# Patient Record
Sex: Male | Born: 1950 | Race: White | Hispanic: No | Marital: Married | Smoking: Former smoker
Health system: Southern US, Community
[De-identification: ages and names within clinical notes are randomized; demographics above are authoritative.]

## PROBLEM LIST (undated history)

## (undated) DIAGNOSIS — K219 Gastro-esophageal reflux disease without esophagitis: Secondary | ICD-10-CM

## (undated) DIAGNOSIS — I2510 Atherosclerotic heart disease of native coronary artery without angina pectoris: Secondary | ICD-10-CM

## (undated) DIAGNOSIS — G43909 Migraine, unspecified, not intractable, without status migrainosus: Secondary | ICD-10-CM

## (undated) DIAGNOSIS — R51 Headache: Secondary | ICD-10-CM

## (undated) DIAGNOSIS — R519 Headache, unspecified: Secondary | ICD-10-CM

## (undated) DIAGNOSIS — R972 Elevated prostate specific antigen [PSA]: Secondary | ICD-10-CM

## (undated) DIAGNOSIS — E785 Hyperlipidemia, unspecified: Secondary | ICD-10-CM

## (undated) DIAGNOSIS — I10 Essential (primary) hypertension: Secondary | ICD-10-CM

## (undated) DIAGNOSIS — Z842 Family history of other diseases of the genitourinary system: Secondary | ICD-10-CM

## (undated) HISTORY — PX: HERNIA REPAIR: SHX51

## (undated) HISTORY — DX: Gastro-esophageal reflux disease without esophagitis: K21.9

## (undated) HISTORY — DX: Essential (primary) hypertension: I10

## (undated) HISTORY — DX: Headache, unspecified: R51.9

## (undated) HISTORY — DX: Migraine, unspecified, not intractable, without status migrainosus: G43.909

## (undated) HISTORY — PX: CHOLECYSTECTOMY: SHX55

## (undated) HISTORY — DX: Hyperlipidemia, unspecified: E78.5

## (undated) HISTORY — DX: Family history of other diseases of the genitourinary system: Z84.2

## (undated) HISTORY — DX: Elevated prostate specific antigen (PSA): R97.20

## (undated) HISTORY — PX: VASECTOMY: SHX75

## (undated) HISTORY — DX: Headache: R51

## (undated) HISTORY — PX: APPENDECTOMY: SHX54

---

## 2017-09-10 ENCOUNTER — Ambulatory Visit (INDEPENDENT_AMBULATORY_CARE_PROVIDER_SITE_OTHER): Payer: Medicare Other | Admitting: Family Medicine

## 2017-09-10 ENCOUNTER — Encounter: Payer: Self-pay | Admitting: Family Medicine

## 2017-09-10 VITALS — BP 100/78 | HR 72 | Temp 98.0°F | Ht 72.0 in | Wt 199.0 lb

## 2017-09-10 DIAGNOSIS — Z7689 Persons encountering health services in other specified circumstances: Secondary | ICD-10-CM | POA: Diagnosis not present

## 2017-09-10 DIAGNOSIS — I10 Essential (primary) hypertension: Secondary | ICD-10-CM | POA: Diagnosis not present

## 2017-09-10 DIAGNOSIS — K219 Gastro-esophageal reflux disease without esophagitis: Secondary | ICD-10-CM | POA: Diagnosis not present

## 2017-09-10 DIAGNOSIS — N401 Enlarged prostate with lower urinary tract symptoms: Secondary | ICD-10-CM

## 2017-09-10 DIAGNOSIS — Z8669 Personal history of other diseases of the nervous system and sense organs: Secondary | ICD-10-CM

## 2017-09-10 DIAGNOSIS — R3914 Feeling of incomplete bladder emptying: Secondary | ICD-10-CM

## 2017-09-10 NOTE — Patient Instructions (Signed)
Benign Prostatic Hyperplasia Benign prostatic hyperplasia (BPH) is an enlarged prostate gland that is caused by the normal aging process and not by cancer. The prostate is a walnut-sized gland that is involved in the production of semen. It is located in front of the rectum and below the bladder. The bladder stores urine and the urethra is the tube that carries the urine out of the body. The prostate may get bigger as a man gets older. An enlarged prostate can press on the urethra. This can make it harder to pass urine. The build-up of urine in the bladder can cause infection. Back pressure and infection may progress to bladder damage and kidney (renal) failure. What are the causes? This condition is part of a normal aging process. However, not all men develop problems from this condition. If the prostate enlarges away from the urethra, urine flow will not be blocked. If it enlarges toward the urethra and compresses it, there will be problems passing urine. What increases the risk? This condition is more likely to develop in men over the age of 47 years. What are the signs or symptoms? Symptoms of this condition include:  Getting up often during the night to urinate.  Needing to urinate frequently during the day.  Difficulty starting urine flow.  Decrease in size and strength of your urine stream.  Leaking (dribbling) after urinating.  Inability to pass urine. This needs immediate treatment.  Inability to completely empty your bladder.  Pain when you pass urine. This is more common if there is also an infection.  Urinary tract infection (UTI).  How is this diagnosed? This condition is diagnosed based on your medical history, a physical exam, and your symptoms. Tests will also be done, such as:  A post-void bladder scan. This measures any amount of urine that may remain in your bladder after you finish urinating.  A digital rectal exam. In a rectal exam, your health care provider  checks your prostate by putting a lubricated, gloved finger into your rectum to feel the back of your prostate gland. This exam detects the size of your gland and any abnormal lumps or growths.  An exam of your urine (urinalysis).  A prostate specific antigen (PSA) screening. This is a blood test used to screen for prostate cancer.  An ultrasound. This test uses sound waves to electronically produce a picture of your prostate gland.  Your health care provider may refer you to a specialist in kidney and prostate diseases (urologist). How is this treated? Once symptoms begin, your health care provider will monitor your condition (active surveillance or watchful waiting). Treatment for this condition will depend on the severity of your condition. Treatment may include:  Observation and yearly exams. This may be the only treatment needed if your condition and symptoms are mild.  Medicines to relieve your symptoms, including: ? Medicines to shrink the prostate. ? Medicines to relax the muscle of the prostate.  Surgery in severe cases. Surgery may include: ? Prostatectomy. In this procedure, the prostate tissue is removed completely through an open incision or with a laparascope or robotics. ? Transurethral resection of the prostate (TURP). In this procedure, a tool is inserted through the opening at the tip of the penis (urethra). It is used to cut away tissue of the inner core of the prostate. The pieces are removed through the same opening of the penis. This removes the blockage. ? Transurethral incision (TUIP). In this procedure, small cuts are made in the prostate. This  lessens the prostate's pressure on the urethra. ? Transurethral microwave thermotherapy (TUMT). This procedure uses microwaves to create heat. The heat destroys and removes a small amount of prostate tissue. ? Transurethral needle ablation (TUNA). This procedure uses radio frequencies to destroy and remove a small amount of  prostate tissue. ? Interstitial laser coagulation ([place]). This procedure uses a laser to destroy and remove a small amount of prostate tissue. ? Transurethral electrovaporization (TUVP). This procedure uses electrodes to destroy and remove a small amount of prostate tissue. ? Prostatic urethral lift. This procedure inserts an implant to push the lobes of the prostate away from the urethra.  Follow these instructions at home:  Take over-the-counter and prescription medicines only as told by your health care provider.  Monitor your symptoms for any changes. Contact your health care provider with any changes.  Avoid drinking large amounts of liquid before going to bed or out in public.  Avoid or reduce how much caffeine or alcohol you drink.  Give yourself time when you urinate.  Keep all follow-up visits as told by your health care provider. This is important. Contact a health care provider if:  You have unexplained back pain.  Your symptoms do not get better with treatment.  You develop side effects from the medicine you are taking.  Your urine becomes very dark or has a bad smell.  Your lower abdomen becomes distended and you have trouble passing your urine. Get help right away if:  You have a fever or chills.  You suddenly cannot urinate.  You feel lightheaded, or very dizzy, or you faint.  There are large amounts of blood or clots in the urine.  Your urinary problems become hard to manage.  You develop moderate to severe low back or flank pain. The flank is the side of your body between the ribs and the hip. These symptoms may represent a serious problem that is an emergency. Do not wait to see if the symptoms will go away. Get medical help right away. Call your local emergency services (911 in the [place] Do not drive yourself to the hospital. Summary  Benign prostatic hyperplasia (BPH) is an enlarged prostate that is caused by the normal aging process and not by  cancer.  An enlarged prostate can press on the urethra. This can make it hard to pass urine.  This condition is part of a normal aging process and is more likely to develop in men over the age of 25 years.  Get help right away if you suddenly cannot urinate. This information is not intended to replace advice given to you by your health care provider. Make sure you discuss any questions you have with your health care provider. Document Released: 02/20/2005 Document Revised: 03/27/2016 Document Reviewed: 03/27/2016 Elsevier Interactive Patient Education  2018 [employer].  Migraine Headache A migraine headache is an intense, throbbing pain on one side or both sides of the head. Migraines may also cause other symptoms, such as nausea, vomiting, and sensitivity to light and noise. What are the causes? Doing or taking certain things may also trigger migraines, such as:  Alcohol.  Smoking.  Medicines, such as: ? Medicine used to treat chest pain (nitroglycerine). ? Birth control pills. ? Estrogen pills. ? Certain blood pressure medicines.  Aged cheeses, chocolate, or caffeine.  Foods or drinks that contain nitrates, glutamate, aspartame, or tyramine.  Physical activity.  Other things that may trigger a migraine include:  Menstruation.  Pregnancy.  Hunger.  Stress, lack  of sleep, too much sleep, or fatigue.  Weather changes.  What increases the risk? The following factors may make you more likely to experience migraine headaches:  Age. Risk increases with age.  Family history of migraine headaches.  Being Caucasian.  Depression and anxiety.  Obesity.  Being a woman.  Having a hole in the heart (patent foramen ovale) or other heart problems.  What are the signs or symptoms? The main symptom of this condition is pulsating or throbbing pain. Pain may:  Happen in any area of the head, such as on one side or both sides.  Interfere with daily activities.  Get  worse with physical activity.  Get worse with exposure to bright lights or loud noises.  Other symptoms may include:  Nausea.  Vomiting.  Dizziness.  General sensitivity to bright lights, loud noises, or smells.  Before you get a migraine, you may get warning signs that a migraine is developing (aura). An aura may include:  Seeing flashing lights or having blind spots.  Seeing bright spots, halos, or zigzag lines.  Having tunnel vision or blurred vision.  Having numbness or a tingling feeling.  Having trouble talking.  Having muscle weakness.  How is this diagnosed? A migraine headache can be diagnosed based on:  Your symptoms.  A physical exam.  Tests, such as CT scan or MRI of the head. These imaging tests can help rule out other causes of headaches.  Taking fluid from the spine (lumbar puncture) and analyzing it (cerebrospinal fluid analysis, or CSF analysis).  How is this treated? A migraine headache is usually treated with medicines that:  Relieve pain.  Relieve nausea.  Prevent migraines from coming back.  Treatment may also include:  Acupuncture.  Lifestyle changes like avoiding foods that trigger migraines.  Follow these instructions at home: Medicines  Take over-the-counter and prescription medicines only as told by your health care provider.  Do not drive or use heavy machinery while taking prescription pain medicine.  To prevent or treat constipation while you are taking prescription pain medicine, your health care provider may recommend that you: ? Drink enough fluid to keep your urine clear or pale yellow. ? Take over-the-counter or prescription medicines. ? Eat foods that are high in fiber, such as fresh fruits and vegetables, whole grains, and beans. ? Limit foods that are high in fat and processed sugars, such as fried and sweet foods. Lifestyle  Avoid alcohol use.  Do not use any products that contain nicotine or tobacco, such as  cigarettes and e-cigarettes. If you need help quitting, ask your health care provider.  Get at least 8 hours of sleep every night.  Limit your stress. General instructions   Keep a journal to find out what may trigger your migraine headaches. For example, write down: ? What you eat and drink. ? How much sleep you get. ? Any change to your diet or medicines.  If you have a migraine: ? Avoid things that make your symptoms worse, such as bright lights. ? It may help to lie down in a dark, quiet room. ? Do not drive or use heavy machinery. ? Ask your health care provider what activities are safe for you while you are experiencing symptoms.  Keep all follow-up visits as told by your health care provider. This is important. Contact a health care provider if:  You develop symptoms that are different or more severe than your usual migraine symptoms. Get help right away if:  Your migraine becomes  severe.  You have a fever.  You have a stiff neck.  You have vision loss.  Your muscles feel weak or like you cannot control them.  You start to lose your balance often.  You develop trouble walking.  You faint. This information is not intended to replace advice given to you by your health care provider. Make sure you discuss any questions you have with your health care provider. Document Released: 02/20/2005 Document Revised: 09/10/2015 Document Reviewed: 08/09/2015 Elsevier Interactive Patient Education  2017 [employer].
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
## 2017-09-14 ENCOUNTER — Ambulatory Visit (INDEPENDENT_AMBULATORY_CARE_PROVIDER_SITE_OTHER): Payer: Medicare Other | Admitting: Family Medicine

## 2017-09-14 ENCOUNTER — Encounter: Payer: Self-pay | Admitting: Family Medicine

## 2017-09-14 VITALS — BP 110/80 | HR 66 | Temp 98.1°F | Ht 72.0 in | Wt 199.9 lb

## 2017-09-14 DIAGNOSIS — Z13220 Encounter for screening for lipoid disorders: Secondary | ICD-10-CM | POA: Diagnosis not present

## 2017-09-14 DIAGNOSIS — Z23 Encounter for immunization: Secondary | ICD-10-CM | POA: Diagnosis not present

## 2017-09-14 DIAGNOSIS — I10 Essential (primary) hypertension: Secondary | ICD-10-CM

## 2017-09-14 DIAGNOSIS — R972 Elevated prostate specific antigen [PSA]: Secondary | ICD-10-CM | POA: Diagnosis not present

## 2017-09-14 DIAGNOSIS — Z0000 Encounter for general adult medical examination without abnormal findings: Secondary | ICD-10-CM

## 2017-09-14 DIAGNOSIS — E782 Mixed hyperlipidemia: Secondary | ICD-10-CM

## 2017-09-14 LAB — LIPID PANEL
CHOL/HDL RATIO: 3
Cholesterol: 143 mg/dL (ref 0–200)
HDL: 46.4 mg/dL (ref >39.00)
LDL CALC: 77 mg/dL (ref 0–99)
NONHDL: 96.25
TRIGLYCERIDES: 94 mg/dL (ref 0.0–149.0)
VLDL: 18.8 mg/dL (ref 0.0–40.0)

## 2017-09-14 LAB — BASIC METABOLIC PANEL
BUN: 23 mg/dL (ref 6–23)
CHLORIDE: 106 meq/L (ref 96–112)
CO2: 28 meq/L (ref 19–32)
Calcium: 9.3 mg/dL (ref 8.4–10.5)
Creatinine, Ser: 1.22 mg/dL (ref 0.40–1.50)
GFR: 63.36 mL/min (ref >60.00)
GLUCOSE: 107 mg/dL — AB (ref 70–99)
POTASSIUM: 4.7 meq/L (ref 3.5–5.1)
SODIUM: 142 meq/L (ref 135–145)

## 2017-09-14 LAB — CBC WITH DIFFERENTIAL/PLATELET
BASOS ABS: 0 10*3/uL (ref 0.0–0.1)
Basophils Relative: 0.6 % (ref 0.0–3.0)
EOS PCT: 3.2 % (ref 0.0–5.0)
Eosinophils Absolute: 0.2 10*3/uL (ref 0.0–0.7)
HCT: 45 % (ref 39.0–52.0)
Hemoglobin: 15.3 g/dL (ref 13.0–17.0)
LYMPHS ABS: 2.2 10*3/uL (ref 0.7–4.0)
Lymphocytes Relative: 28.9 % (ref 12.0–46.0)
MCHC: 33.9 g/dL (ref 30.0–36.0)
MCV: 93.4 fl (ref 78.0–100.0)
MONO ABS: 0.5 10*3/uL (ref 0.1–1.0)
Monocytes Relative: 6.7 % (ref 3.0–12.0)
NEUTROS PCT: 60.6 % (ref 43.0–77.0)
Neutro Abs: 4.7 10*3/uL (ref 1.4–7.7)
Platelets: 177 10*3/uL (ref 150.0–400.0)
RBC: 4.82 Mil/uL (ref 4.22–5.81)
RDW: 13.5 % (ref 11.5–15.5)
WBC: 7.7 10*3/uL (ref 4.0–10.5)

## 2017-09-14 LAB — PSA: PSA: 2.32 ng/mL (ref 0.10–4.00)

## 2017-09-14 NOTE — Patient Instructions (Signed)
Preventive Care 65 Years and Older, Male Preventive care refers to lifestyle choices and visits with your health care provider that can promote health and wellness. What does preventive care include?  A yearly physical exam. This is also called an annual well check.  Dental exams once or twice a year.  Routine eye exams. Ask your health care provider how often you should have your eyes checked.  Personal lifestyle choices, including: ? Daily care of your teeth and gums. ? Regular physical activity. ? Eating a healthy diet. ? Avoiding tobacco and drug use. ? Limiting alcohol use. ? Practicing safe sex. ? Taking low doses of aspirin every day. ? Taking vitamin and mineral supplements as recommended by your health care provider. What happens during an annual well check? The services and screenings done by your health care provider during your annual well check will depend on your age, overall health, lifestyle risk factors, and family history of disease. Counseling Your health care provider may ask you questions about your:  Alcohol use.  Tobacco use.  Drug use.  Emotional well-being.  Home and relationship well-being.  Sexual activity.  Eating habits.  History of falls.  Memory and ability to understand (cognition).  Work and work environment.  Screening You may have the following tests or measurements:  Height, weight, and BMI.  Blood pressure.  Lipid and cholesterol levels. These may be checked every 5 years, or more frequently if you are over 50 years old.  Skin check.  Lung cancer screening. You may have this screening every year starting at age 55 if you have a 30-pack-year history of smoking and currently smoke or have quit within the past 15 years.  Fecal occult blood test (FOBT) of the stool. You may have this test every year starting at age 50.  Flexible sigmoidoscopy or colonoscopy. You may have a sigmoidoscopy every 5 years or a colonoscopy every 10  years starting at age 50.  Prostate cancer screening. Recommendations will vary depending on your family history and other risks.  Hepatitis C blood test.  Hepatitis B blood test.  Sexually transmitted disease (STD) testing.  Diabetes screening. This is done by checking your blood sugar (glucose) after you have not eaten for a while (fasting). You may have this done every 1-3 years.  Abdominal aortic aneurysm (AAA) screening. You may need this if you are a current or former smoker.  Osteoporosis. You may be screened starting at age 70 if you are at high risk.  Talk with your health care provider about your test results, treatment options, and if necessary, the need for more tests. Vaccines Your health care provider may recommend certain vaccines, such as:  Influenza vaccine. This is recommended every year.  Tetanus, diphtheria, and acellular pertussis (Tdap, Td) vaccine. You may need a Td booster every 10 years.  Varicella vaccine. You may need this if you have not been vaccinated.  Zoster vaccine. You may need this after age 60.  Measles, mumps, and rubella (MMR) vaccine. You may need at least one dose of MMR if you were born in 1957 or later. You may also need a second dose.  Pneumococcal 13-valent conjugate (PCV13) vaccine. One dose is recommended after age 65.  Pneumococcal polysaccharide (PPSV23) vaccine. One dose is recommended after age 65.  Meningococcal vaccine. You may need this if you have certain conditions.  Hepatitis A vaccine. You may need this if you have certain conditions or if you travel or work in places where you   may be exposed to hepatitis A.  Hepatitis B vaccine. You may need this if you have certain conditions or if you travel or work in places where you may be exposed to hepatitis B.  Haemophilus influenzae type b (Hib) vaccine. You may need this if you have certain risk factors.  Talk to your health care provider about which screenings and vaccines  you need and how often you need them. This information is not intended to replace advice given to you by your health care provider. Make sure you discuss any questions you have with your health care provider. Document Released: 03/19/2015 Document Revised: 11/10/2015 Document Reviewed: 12/22/2014 Elsevier Interactive Patient Education  2018 [employer]  

## 2017-09-14 NOTE — Progress Notes (Signed)
Subjective:  Patient presents today for their Welcome to Medicare Exam    Preventive Screening-Counseling & Management  Vision screen: wears glasses No exam data present  Advanced directives: no  Smoking Status: Never Smoker Second Hand Smoking status: no smokers in home  Risk Factors Regular exercise: work around the house Diet: Paradox diet--lost 40 lbs.   Tries to limit greasy foods as it causes GERD.  Limits caffeine, drinking plenty of water Fall Risk: None  No flowsheet data found. Opioid use history: no long term opioids use  Cardiac risk factors:  advanced age (older than 42 for men, 28 for women)  Hyperlipidemia: yes No diabetes. no Family History: yes.  Mom-HTN Dad-DM, Heart Dz, HDL, HTN, MI  Depression Screen None. PHQ2 0  No flowsheet data found.  Activities of Daily Living Independent ADLs and IADLs   Hearing Difficulties: no Cognitive Testing             No reported trouble.    Normal 3 word recall  List the Names of Other Physician/Practitioners you currently use: -none.  Referral for Urology pending.  H/o elevated PSA in the past.   -previously seen by [facility] in [place], [place]  Immunization History  Administered Date(s) Administered  . Tdap 09/14/2017   Required Immunizations needed today  Tdap  Screening tests- up to date Health Maintenance Due  Topic Date Due  . Hepatitis C Screening  [birth year]  . HIV Screening  09/15/1967  . PNA vac Low Risk Adult (1 of 2 - PCV13) 09/14/2017    ROS- No pertinent positives discovered in course of AWV  The following were reviewed and entered/updated in epic: Past Medical History:  Diagnosis Date  . Elevated PSA   . Family history of prostate problems   . Frequent headaches   . GERD (gastroesophageal reflux disease)   . Hyperlipidemia   . Hypertension   . Migraines    There are no active problems to display for this patient.  Past Surgical History:  Procedure Laterality Date    . APPENDECTOMY    . CHOLECYSTECTOMY    . HERNIA REPAIR    . VASECTOMY      Family History  Problem Relation Age of Onset  . Hypertension Mother   . Cancer Father   . Diabetes Father   . Heart attack Father   . Heart disease Father   . Hyperlipidemia Father   . Hypertension Father   . Hypertension Brother   . Hyperlipidemia Brother   . Heart disease Brother   . Hearing loss Brother   . Early death Maternal Grandfather   . Diabetes Paternal Grandmother   . Early death Paternal Grandfather   . Heart disease Paternal Grandfather     Medications- reviewed and updated Current Outpatient Medications  Medication Sig Dispense Refill  . amitriptyline (ELAVIL) 50 MG tablet Take 50 mg by mouth as needed for sleep.    Marland Kitchen amLODipine (NORVASC) 5 MG tablet Take 5 mg by mouth daily.    Marland Kitchen aspirin EC 81 MG tablet Take 81 mg by mouth daily.    Marland Kitchen omeprazole (PRILOSEC) 20 MG capsule Take 20 mg by mouth daily.    . Potassium 99 MG TABS Take 99 mg by mouth daily.     No current facility-administered medications for this visit.     Allergies-reviewed and updated Allergies  Allergen Reactions  . Amoxicillin Other (See Comments)    "Cloudy headed" with long doses  Social History   Socioeconomic History  . Marital status: Married    Spouse name: Not on file  . Number of children: Not on file  . Years of education: Not on file  . Highest education level: Not on file  Occupational History  . Not on file  Social Needs  . Financial resource strain: Not on file  . Food insecurity:    Worry: Not on file    Inability: Not on file  . Transportation needs:    Medical: Not on file    Non-medical: Not on file  Tobacco Use  . Smoking status: Former Research scientist (life sciences)  . Smokeless tobacco: Former Network engineer and Sexual Activity  . Alcohol use: Yes  . Drug use: Never  . Sexual activity: Not Currently  Lifestyle  . Physical activity:    Days per week: Not on file    Minutes per session: Not  on file  . Stress: Not on file  Relationships  . Social connections:    Talks on phone: Not on file    Gets together: Not on file    Attends religious service: Not on file    Active member of club or organization: Not on file    Attends meetings of clubs or organizations: Not on file    Relationship status: Not on file  Other Topics Concern  . Not on file  Social History Narrative  . Not on file    Objective: BP 110/80 (BP Location: Left Arm, Patient Position: Sitting, Cuff Size: Normal)   Pulse 66   Temp 98.1 F (36.7 C) (Oral)   Ht 6' (1.829 m)   Wt 199 lb 14.4 oz (90.7 kg)   SpO2 97%   BMI 27.11 kg/m  Gen: NAD, well developed, well nourished. HEENT: [place]/AT, PERRL, TMs normal bilaterally.  No cervical lymphadenopathy.  Mucous membranes are moist. Oropharynx normal. Neck: no thyromegaly, no carotid bruits CV: RRR, no murmurs rubs or gallops Lungs: CTAB, no crackles, wheeze, rhonchi Abdomen: soft/nontender/nondistended/normal bowel sounds. No rebound or guarding.  Ext: no edema, moves all four Skin: warm, dry Neuro: A&Ox3, grossly normal, normal gait  Assessment/Plan:  Welcome to Medicare exam completed- discussed recommended screenings and documented any personalized health advice and referrals for preventive counseling. See AVS as well which was given to patient.   Status of chronic or acute concerns  HTN- controlled on no meds.  If becomes elevated re-prescribe Norvasc 5 mg.  GERD- continue weight loss, as symptoms have improved.  Omeprazole prn  BPH- consider restarting flomax 0.4 mg.  Is waiting to see urology.  No problem-specific Assessment & Plan notes found for this encounter.   No future appointments. No follow-ups on file.   Lab/Order associations: Need for diphtheria-tetanus-pertussis (Tdap) vaccine - Plan: Tdap vaccine greater than or equal to 7yo IM  Screening for cholesterol level - Plan: Lipid panel  Essential hypertension - Plan: CBC with  Differential/Platelet, Basic metabolic panel  Encounter for preventive care  Elevated PSA - Plan: PSA  No orders of the defined types were placed in this encounter.   Return precautions advised. Billie Ruddy, MD

## 2017-09-19 ENCOUNTER — Ambulatory Visit: Payer: Medicare Other

## 2017-10-10 DIAGNOSIS — R3912 Poor urinary stream: Secondary | ICD-10-CM | POA: Diagnosis not present

## 2017-10-10 DIAGNOSIS — N401 Enlarged prostate with lower urinary tract symptoms: Secondary | ICD-10-CM | POA: Diagnosis not present

## 2017-10-10 DIAGNOSIS — R350 Frequency of micturition: Secondary | ICD-10-CM | POA: Diagnosis not present

## 2017-11-01 ENCOUNTER — Ambulatory Visit (INDEPENDENT_AMBULATORY_CARE_PROVIDER_SITE_OTHER): Payer: Medicare Other | Admitting: Family Medicine

## 2017-11-01 ENCOUNTER — Encounter: Payer: Self-pay | Admitting: Family Medicine

## 2017-11-01 VITALS — BP 102/77 | HR 77 | Temp 98.3°F | Wt 201.3 lb

## 2017-11-01 DIAGNOSIS — M10079 Idiopathic gout, unspecified ankle and foot: Secondary | ICD-10-CM

## 2017-11-01 MED ORDER — PREDNISONE 20 MG PO TABS: ORAL_TABLET | ORAL | 0 refills | Status: DC

## 2017-11-01 NOTE — Progress Notes (Signed)
  Subjective:     Patient ID: Ronald Olsen, male   DOB: June 30, 1950, 67 y.o.   MRN: [id number]  HPI Patient seen with concern for possible gout involving both feet. He's never had gout flareup previously but there is positive family history in his father. He noted a few days ago onset of pain,swelling, redness and warmth metatarsophalangeal joint of both feet as well as left heel. He tried over-the-counter aspirin without much improvement.  Denies any recent fevers or chills. No injury. Only regular medication is Flomax.  Does not drink alcohol regularly. Last beer was about 2 weeks ago. No recent dehydration  Past Medical History:  Diagnosis Date  . Elevated PSA   . Family history of prostate problems   . Frequent headaches   . GERD (gastroesophageal reflux disease)   . Hyperlipidemia   . Hypertension   . Migraines    Past Surgical History:  Procedure Laterality Date  . APPENDECTOMY    . CHOLECYSTECTOMY    . HERNIA REPAIR    . VASECTOMY      reports that he has quit smoking. He has quit using smokeless tobacco. He reports that he drinks alcohol. He reports that he does not use drugs. family history includes Cancer in his father; Diabetes in his father and paternal grandmother; Early death in his maternal grandfather and paternal grandfather; Hearing loss in his brother; Heart attack in his father; Heart disease in his brother, father, and paternal grandfather; Hyperlipidemia in his brother and father; Hypertension in his brother, father, and mother. Allergies  Allergen Reactions  . Amoxicillin Other (See Comments)    "Cloudy headed" with long doses     Review of Systems  Constitutional: Negative for chills and fever.  Musculoskeletal: Positive for arthralgias.       Objective:   Physical Exam  Constitutional: He appears well-developed and well-nourished.  Cardiovascular: Normal rate and regular rhythm.  Pulmonary/Chest: Effort normal and breath sounds normal.   Musculoskeletal:  Patient has some mild swelling, warmth, erythema involving both MTP joints. He has some left retrocalcaneal erythema as well. No Achilles tendon tenderness. No problems with dorsiflexion or plantarflexion left foot       Assessment:     Problem acute gout involving both feet metatarsophalangeal joints    Plan:     -discussed dietary factors as related to gout. Stressed importance of good hydration. Recommend low purine diet. -Prednisone 20 mg 2 tablets once daily for 6 days -Follow-up with primary if symptoms not resolving over the next few days or for any recurrence. We did briefly discuss possible prophylactic issues but he is not interested at this point  [name] MD [facility] at [facility]

## 2017-11-01 NOTE — Patient Instructions (Signed)
Low-Purine Diet  Purines are compounds that affect the level of uric acid in your body. A low-purine diet is a diet that is low in purines. Eating a low-purine diet can prevent the level of uric acid in your body from getting too high and causing gout or kidney stones or both.  What do I need to know about this diet?   Choose low-purine foods. Examples of low-purine foods are listed in the next section.   Drink plenty of fluids, especially water. Fluids can help remove uric acid from your body. Try to drink 8-16 cups (1.9-3.8 L) a day.   Limit foods high in fat, especially saturated fat, as fat makes it harder for the body to get rid of uric acid. Foods high in saturated fat include pizza, cheese, ice cream, whole milk, fried foods, and gravies. Choose foods that are lower in fat and lean sources of protein. Use olive oil when cooking as it contains healthy fats that are not high in saturated fat.   Limit alcohol. Alcohol interferes with the elimination of uric acid from your body. If you are having a gout attack, avoid all alcohol.   Keep in mind that different people's bodies react differently to different foods. You will probably learn over time which foods do or do not affect you. If you discover that a food tends to cause your gout to flare up, avoid eating that food. You can more freely enjoy foods that do not cause problems. If you have any questions about a food item, talk to your dietitian or health care provider.  Which foods are low, moderate, and high in purines?  The following is a list of foods that are low, moderate, and high in purines. You can eat any amount of the foods that are low in purines. You may be able to have small amounts of foods that are moderate in purines. Ask your health care provider how much of a food moderate in purines you can have. Avoid foods high in purines.  Grains   Foods low in purines: Enriched white bread, pasta, rice, cake, cornbread, popcorn.   Foods moderate in  purines: Whole-grain breads and cereals, wheat germ, bran, oatmeal. Uncooked oatmeal. Dry wheat bran or wheat germ.   Foods high in purines: Pancakes, French toast, biscuits, muffins.  Vegetables   Foods low in purines: All vegetables, except those that are moderate in purines.   Foods moderate in purines: Asparagus, cauliflower, spinach, mushrooms, green peas.  Fruits   All fruits are low in purines.  Meats and other Protein Foods   Foods low in purines: Eggs, nuts, peanut butter.   Foods moderate in purines: 80-90% lean beef, lamb, veal, pork, poultry, fish, eggs, peanut butter, nuts. Crab, lobster, oysters, and shrimp. Cooked dried beans, peas, and lentils.   Foods high in purines: Anchovies, sardines, herring, mussels, tuna, codfish, scallops, trout, and haddock. Bacon. Organ meats (such as liver or kidney). Tripe. Game meat. Goose. Sweetbreads.  Dairy   All dairy foods are low in purines. Low-fat and fat-free dairy products are best because they are low in saturated fat.  Beverages   Drinks low in purines: Water, carbonated beverages, tea, coffee, cocoa.   Drinks moderate in purines: Soft drinks and other drinks sweetened with high-fructose corn syrup. Juices. To find whether a food or drink is sweetened with high-fructose corn syrup, look at the ingredients list.   Drinks high in purines: Alcoholic beverages (such as beer).  Condiments   Foods   low in purines: Salt, herbs, olives, pickles, relishes, vinegar.   Foods moderate in purines: Butter, margarine, oils, mayonnaise.  Fats and Oils   Foods low in purines: All types, except gravies and sauces made with meat.   Foods high in purines: Gravies and sauces made with meat.  Other Foods   Foods low in purines: Sugars, sweets, gelatin. Cake. Soups made without meat.   Foods moderate in purines: Meat-based or fish-based soups, broths, or bouillons. Foods and drinks sweetened with high-fructose corn syrup.   Foods high in purines: High-fat desserts  (such as ice cream, cookies, cakes, pies, doughnuts, and chocolate).  Contact your dietitian for more information on foods that are not listed here.  This information is not intended to replace advice given to you by your health care provider. Make sure you discuss any questions you have with your health care provider.  Document Released: 06/17/2010 Document Revised: 07/29/2015 Document Reviewed: 01/27/2013  Elsevier Interactive Patient Education  2017 Elsevier Inc.      Gout  Gout is painful swelling that can occur in some of your joints. Gout is a type of arthritis. This condition is caused by having too much uric acid in your body. Uric acid is a chemical that forms when your body breaks down substances called purines. Purines are important for building body proteins.  When your body has too much uric acid, sharp crystals can form and build up inside your joints. This causes pain and swelling. Gout attacks can happen quickly and be very painful (acute gout). Over time, the attacks can affect more joints and become more frequent (chronic gout). Gout can also cause uric acid to build up under your skin and inside your kidneys.  What are the causes?  This condition is caused by too much uric acid in your blood. This can occur because:   Your kidneys do not remove enough uric acid from your blood. This is the most common cause.   Your body makes too much uric acid. This can occur with some cancers and cancer treatments. It can also occur if your body is breaking down too many red blood cells (hemolytic anemia).   You eat too many foods that are high in purines. These foods include organ meats and some seafood. Alcohol, especially beer, is also high in purines.    A gout attack may be triggered by trauma or stress.  What increases the risk?  This condition is more likely to develop in people who:   Have a family history of gout.   Are male and middle-aged.   Are male and have gone through menopause.   Are  obese.   Frequently drink alcohol, especially beer.   Are dehydrated.   Lose weight too quickly.   Have an organ transplant.   Have lead poisoning.   Take certain medicines, including aspirin, cyclosporine, diuretics, levodopa, and niacin.   Have kidney disease or psoriasis.    What are the signs or symptoms?  An attack of acute gout happens quickly. It usually occurs in just one joint. The most common place is the big toe. Attacks often start at night. Other joints that may be affected include joints of the feet, ankle, knee, fingers, wrist, or elbow. Symptoms may include:   Severe pain.   Warmth.   Swelling.   Stiffness.   Tenderness. The affected joint may be very painful to touch.   Shiny, red, or purple skin.   Chills and fever.      Chronic gout may cause symptoms more frequently. More joints may be involved. You may also have white or yellow lumps (tophi) on your hands or feet or in other areas near your joints.  How is this diagnosed?  This condition is diagnosed based on your symptoms, medical history, and physical exam. You may have tests, such as:   Blood tests to measure uric acid levels.   Removal of joint fluid with a needle (aspiration) to look for uric acid crystals.   X-rays to look for joint damage.    How is this treated?  Treatment for this condition has two phases: treating an acute attack and preventing future attacks. Acute gout treatment may include medicines to reduce pain and swelling, including:   NSAIDs.   Steroids. These are strong anti-inflammatory medicines that can be taken by mouth (orally) or injected into a joint.   Colchicine. This medicine relieves pain and swelling when it is taken soon after an attack. It can be given orally or through an IV tube.    Preventive treatment may include:   Daily use of smaller doses of NSAIDs or colchicine.   Use of a medicine that reduces uric acid levels in your blood.   Changes to your diet. You may need to see a specialist  about healthy eating (dietitian).    Follow these instructions at home:  During a Gout Attack   If directed, apply ice to the affected area:  ? Put ice in a plastic bag.  ? Place a towel between your skin and the bag.  ? Leave the ice on for 20 minutes, 2-3 times a day.   Rest the joint as much as possible. If the affected joint is in your leg, you may be given crutches to use.   Raise (elevate) the affected joint above the level of your heart as often as possible.   Drink enough fluids to keep your urine clear or pale yellow.   Take over-the-counter and prescription medicines only as told by your health care provider.   Do not drive or operate heavy machinery while taking prescription pain medicine.   Follow instructions from your health care provider about eating or drinking restrictions.   Return to your normal activities as told by your health care provider. Ask your health care provider what activities are safe for you.  Avoiding Future Gout Attacks   Follow a low-purine diet as told by your dietitian or health care provider. Avoid foods and drinks that are high in purines, including liver, kidney, anchovies, asparagus, herring, mushrooms, mussels, and beer.   Limit alcohol intake to no more than 1 drink a day for nonpregnant women and 2 drinks a day for men. One drink equals 12 oz of beer, 5 oz of wine, or 1 oz of hard liquor.   Maintain a healthy weight or lose weight if you are overweight. If you want to lose weight, talk with your health care provider. It is important that you do not lose weight too quickly.   Start or maintain an exercise program as told by your health care provider.   Drink enough fluids to keep your urine clear or pale yellow.   Take over-the-counter and prescription medicines only as told by your health care provider.   Keep all follow-up visits as told by your health care provider. This is important.  Contact a health care provider if:   You have another gout  attack.   You continue to have symptoms of   a gout attack after10 days of treatment.   You have side effects from your medicines.   You have chills or a fever.   You have burning pain when you urinate.   You have pain in your lower back or belly.  Get help right away if:   You have severe or uncontrolled pain.   You cannot urinate.  This information is not intended to replace advice given to you by your health care provider. Make sure you discuss any questions you have with your health care provider.  Document Released: 02/18/2000 Document Revised: 07/29/2015 Document Reviewed: 12/03/2014  Elsevier Interactive Patient Education  2018 Elsevier Inc[employer]

## 2017-11-07 ENCOUNTER — Telehealth: Payer: Self-pay | Admitting: Family Medicine

## 2017-11-07 NOTE — Telephone Encounter (Signed)
Copied from [place] [id number]. Topic: General - Other >> Nov 07, 2017 12:42 PM Keene Breath wrote: Reason for CRM: Patient called to inform doctor that he is still in pain from his gout and would like to know what the next course of action should be.  Please advise.  CB# [phone].

## 2017-11-08 ENCOUNTER — Other Ambulatory Visit: Payer: Self-pay

## 2017-11-08 ENCOUNTER — Other Ambulatory Visit: Payer: Self-pay | Admitting: Family Medicine

## 2017-11-08 DIAGNOSIS — M109 Gout, unspecified: Secondary | ICD-10-CM

## 2017-11-08 MED ORDER — COLCHICINE 0.6 MG PO TABS: ORAL_TABLET | ORAL | 0 refills | Status: DC

## 2017-11-08 NOTE — Telephone Encounter (Signed)
The aleeve is the next best option.

## 2017-11-08 NOTE — Telephone Encounter (Signed)
Pt can take Naproxen (aleeve) 500 mg BID.  I have also sent in a rx for colchicine.  This may not be as effective given pt's symptoms have been ongoing for several days.  Pt is to take 2 pill immediately, then take 1 pill (0.6 mg) one hour later on day one.  On days two and on pt will take 1 pill daily until symptoms resolve.  If pt continues to have pain after 1 wk, he needs to be re-evaluated in the office.

## 2017-11-08 NOTE — Telephone Encounter (Signed)
Called pt left a message to return my call in the office regarding his gout medications

## 2017-11-08 NOTE — Telephone Encounter (Signed)
Spoke with pt voiced understanding of Dr Volanda Napoleon instructions, pt stated that the Colchicine for his gout was very pricey at $175 and he cannot afford, requests if there is an alternative medication he can take that is not too costly.

## 2017-11-08 NOTE — Telephone Encounter (Signed)
Patient returning a call to [place]. Please advise.

## 2017-11-09 NOTE — Telephone Encounter (Signed)
Called left a detailed message with dr Volanda Napoleon recommendations.

## 2017-11-21 ENCOUNTER — Encounter: Payer: Self-pay | Admitting: Family Medicine

## 2017-11-21 ENCOUNTER — Ambulatory Visit (INDEPENDENT_AMBULATORY_CARE_PROVIDER_SITE_OTHER): Payer: Medicare Other | Admitting: Family Medicine

## 2017-11-21 VITALS — BP 110/82 | HR 64 | Temp 98.2°F | Ht 72.0 in | Wt 203.0 lb

## 2017-11-21 DIAGNOSIS — Z0000 Encounter for general adult medical examination without abnormal findings: Secondary | ICD-10-CM | POA: Diagnosis not present

## 2017-11-21 NOTE — Patient Instructions (Addendum)
Health Maintenance, Male A healthy lifestyle and preventive care is important for your health and wellness. Ask your health care provider about what schedule of regular examinations is right for you. What should I know about weight and diet? Eat a Healthy Diet  Eat plenty of vegetables, fruits, whole grains, low-fat dairy products, and lean protein.  Do not eat a lot of foods high in solid fats, added sugars, or salt.  Maintain a Healthy Weight Regular exercise can help you achieve or maintain a healthy weight. You should:  Do at least 150 minutes of exercise each week. The exercise should increase your heart rate and make you sweat (moderate-intensity exercise).  Do strength-training exercises at least twice a week.  Watch Your Levels of Cholesterol and Blood Lipids  Have your blood tested for lipids and cholesterol every 5 years starting at 65 years of age. If you are at high risk for heart disease, you should start having your blood tested when you are 65 years old. You may need to have your cholesterol levels checked more often if: ? Your lipid or cholesterol levels are high. ? You are older than 65 years of age. ? You are at high risk for heart disease.  What should I know about cancer screening? Many types of cancers can be detected early and may often be prevented. Lung Cancer  You should be screened every year for lung cancer if: ? You are a current smoker who has smoked for at least 30 years. ? You are a former smoker who has quit within the past 15 years.  Talk to your health care provider about your screening options, when you should start screening, and how often you should be screened.  Colorectal Cancer  Routine colorectal cancer screening usually begins at 65 years of age and should be repeated every 5-10 years until you are 65 years old. You may need to be screened more often if early forms of precancerous polyps or small growths are found. Your health care provider  may recommend screening at an earlier age if you have risk factors for colon cancer.  Your health care provider may recommend using home test kits to check for hidden blood in the stool.  A small camera at the end of a tube can be used to examine your colon (sigmoidoscopy or colonoscopy). This checks for the earliest forms of colorectal cancer.  Prostate and Testicular Cancer  Depending on your age and overall health, your health care provider may do certain tests to screen for prostate and testicular cancer.  Talk to your health care provider about any symptoms or concerns you have about testicular or prostate cancer.  Skin Cancer  Check your skin from head to toe regularly.  Tell your health care provider about any new moles or changes in moles, especially if: ? There is a change in a mole's size, shape, or color. ? You have a mole that is larger than a pencil eraser.  Always use sunscreen. Apply sunscreen liberally and repeat throughout the day.  Protect yourself by wearing long sleeves, pants, a wide-brimmed hat, and sunglasses when outside.  What should I know about heart disease, diabetes, and high blood pressure?  If you are 18-39 years of age, have your blood pressure checked every 3-5 years. If you are 40 years of age or older, have your blood pressure checked every year. You should have your blood pressure measured twice-once when you are at a hospital or clinic, and once when   you are not at a hospital or clinic. Record the average of the two measurements. To check your blood pressure when you are not at a hospital or clinic, you can use: ? An automated blood pressure machine at a pharmacy. ? A home blood pressure monitor.  Talk to your health care provider about your target blood pressure.  If you are between 68-52 years old, ask your health care provider if you should take aspirin to prevent heart disease.  Have regular diabetes screenings by checking your fasting blood  sugar level. ? If you are at a normal weight and have a low risk for diabetes, have this test once every three years after the age of 28. ? If you are overweight and have a high risk for diabetes, consider being tested at a younger age or more often.  A one-time screening for abdominal aortic aneurysm (AAA) by ultrasound is recommended for men aged 71-75 years who are current or former smokers. What should I know about preventing infection? Hepatitis B If you have a higher risk for hepatitis B, you should be screened for this virus. Talk with your health care provider to find out if you are at risk for hepatitis B infection. Hepatitis C Blood testing is recommended for:  Everyone born from [birth year] through 1963.  Anyone with known risk factors for hepatitis C.  Sexually Transmitted Diseases (STDs)  You should be screened each year for STDs including gonorrhea and chlamydia if: ? You are sexually active and are younger than 65 years of age. ? You are older than 65 years of age and your health care provider tells you that you are at risk for this type of infection. ? Your sexual activity has changed since you were last screened and you are at an increased risk for chlamydia or gonorrhea. Ask your health care provider if you are at risk.  Talk with your health care provider about whether you are at high risk of being infected with HIV. Your health care provider may recommend a prescription medicine to help prevent HIV infection.  What else can I do?  Schedule regular health, dental, and eye exams.  Stay current with your vaccines (immunizations).  Do not use any tobacco products, such as cigarettes, chewing tobacco, and e-cigarettes. If you need help quitting, ask your health care provider.  Limit alcohol intake to no more than 2 drinks per day. One drink equals 12 ounces of beer, 5 ounces of wine, or 1 ounces of hard liquor.  Do not use street drugs.  Do not share needles.  Ask your  health care provider for help if you need support or information about quitting drugs.  Tell your health care provider if you often feel depressed.  Tell your health care provider if you have ever been abused or do not feel safe at home. This information is not intended to replace advice given to you by your health care provider. Make sure you discuss any questions you have with your health care provider. Document Released: 08/19/2007 Document Revised: 10/20/2015 Document Reviewed: 11/24/2014 Elsevier Interactive Patient Education  2018 [employer].  Gout Gout is painful swelling that can occur in some of your joints. Gout is a type of arthritis. This condition is caused by having too much uric acid in your body. Uric acid is a chemical that forms when your body breaks down substances called purines. Purines are important for building body proteins. When your body has too much uric acid, sharp crystals  can form and build up inside your joints. This causes pain and swelling. Gout attacks can happen quickly and be very painful (acute gout). Over time, the attacks can affect more joints and become more frequent (chronic gout). Gout can also cause uric acid to build up under your skin and inside your kidneys. What are the causes? This condition is caused by too much uric acid in your blood. This can occur because:  Your kidneys do not remove enough uric acid from your blood. This is the most common cause.  Your body makes too much uric acid. This can occur with some cancers and cancer treatments. It can also occur if your body is breaking down too many red blood cells (hemolytic anemia).  You eat too many foods that are high in purines. These foods include organ meats and some seafood. Alcohol, especially beer, is also high in purines.  A gout attack may be triggered by trauma or stress. What increases the risk? This condition is more likely to develop in people who:  Have a family history of  gout.  Are male and middle-aged.  Are male and have gone through menopause.  Are obese.  Frequently drink alcohol, especially beer.  Are dehydrated.  Lose weight too quickly.  Have an organ transplant.  Have lead poisoning.  Take certain medicines, including aspirin, cyclosporine, diuretics, levodopa, and niacin.  Have kidney disease or psoriasis.  What are the signs or symptoms? An attack of acute gout happens quickly. It usually occurs in just one joint. The most common place is the big toe. Attacks often start at night. Other joints that may be affected include joints of the feet, ankle, knee, fingers, wrist, or elbow. Symptoms may include:  Severe pain.  Warmth.  Swelling.  Stiffness.  Tenderness. The affected joint may be very painful to touch.  Shiny, red, or purple skin.  Chills and fever.  Chronic gout may cause symptoms more frequently. More joints may be involved. You may also have white or yellow lumps (tophi) on your hands or feet or in other areas near your joints. How is this diagnosed? This condition is diagnosed based on your symptoms, medical history, and physical exam. You may have tests, such as:  Blood tests to measure uric acid levels.  Removal of joint fluid with a needle (aspiration) to look for uric acid crystals.  X-rays to look for joint damage.  How is this treated? Treatment for this condition has two phases: treating an acute attack and preventing future attacks. Acute gout treatment may include medicines to reduce pain and swelling, including:  NSAIDs.  Steroids. These are strong anti-inflammatory medicines that can be taken by mouth (orally) or injected into a joint.  Colchicine. This medicine relieves pain and swelling when it is taken soon after an attack. It can be given orally or through an IV tube.  Preventive treatment may include:  Daily use of smaller doses of NSAIDs or colchicine.  Use of a medicine that reduces  uric acid levels in your blood.  Changes to your diet. You may need to see a specialist about healthy eating (dietitian).  Follow these instructions at home: During a Gout Attack  If directed, apply ice to the affected area: ? Put ice in a plastic bag. ? Place a towel between your skin and the bag. ? Leave the ice on for 20 minutes, 2-3 times a day.  Rest the joint as much as possible. If the affected joint is in your  leg, you may be given crutches to use.  Raise (elevate) the affected joint above the level of your heart as often as possible.  Drink enough fluids to keep your urine clear or pale yellow.  Take over-the-counter and prescription medicines only as told by your health care provider.  Do not drive or operate heavy machinery while taking prescription pain medicine.  Follow instructions from your health care provider about eating or drinking restrictions.  Return to your normal activities as told by your health care provider. Ask your health care provider what activities are safe for you. Avoiding Future Gout Attacks  Follow a low-purine diet as told by your dietitian or health care provider. Avoid foods and drinks that are high in purines, including liver, kidney, anchovies, asparagus, herring, mushrooms, mussels, and beer.  Limit alcohol intake to no more than 1 drink a day for nonpregnant women and 2 drinks a day for men. One drink equals 12 oz of beer, 5 oz of wine, or 1 oz of hard liquor.  Maintain a healthy weight or lose weight if you are overweight. If you want to lose weight, talk with your health care provider. It is important that you do not lose weight too quickly.  Start or maintain an exercise program as told by your health care provider.  Drink enough fluids to keep your urine clear or pale yellow.  Take over-the-counter and prescription medicines only as told by your health care provider.  Keep all follow-up visits as told by your health care provider.  This is important. Contact a health care provider if:  You have another gout attack.  You continue to have symptoms of a gout attack after10 days of treatment.  You have side effects from your medicines.  You have chills or a fever.  You have burning pain when you urinate.  You have pain in your lower back or belly. Get help right away if:  You have severe or uncontrolled pain.  You cannot urinate. This information is not intended to replace advice given to you by your health care provider. Make sure you discuss any questions you have with your health care provider. Document Released: 02/18/2000 Document Revised: 07/29/2015 Document Reviewed: 12/03/2014 Elsevier Interactive Patient Education  2018 [place] are compounds that affect the level of uric acid in your body. A low-purine diet is a diet that is low in purines. Eating a low-purine diet can prevent the level of uric acid in your body from getting too high and causing gout or kidney stones or both. What do I need to know about this diet?  Choose low-purine foods. Examples of low-purine foods are listed in the next section.  Drink plenty of fluids, especially water. Fluids can help remove uric acid from your body. Try to drink 8-16 cups (1.9-3.8 L) a day.  Limit foods high in fat, especially saturated fat, as fat makes it harder for the body to get rid of uric acid. Foods high in saturated fat include pizza, cheese, ice cream, whole milk, fried foods, and gravies. Choose foods that are lower in fat and lean sources of protein. Use olive oil when cooking as it contains healthy fats that are not high in saturated fat.  Limit alcohol. Alcohol interferes with the elimination of uric acid from your body. If you are having a gout attack, avoid all alcohol.  Keep in mind that different people's bodies react differently to different foods. You will probably learn over time which  foods do or do not affect  you. If you discover that a food tends to cause your gout to flare up, avoid eating that food. You can more freely enjoy foods that do not cause problems. If you have any questions about a food item, talk to your dietitian or health care provider. Which foods are low, moderate, and high in purines? The following is a list of foods that are low, moderate, and high in purines. You can eat any amount of the foods that are low in purines. You may be able to have small amounts of foods that are moderate in purines. Ask your health care provider how much of a food moderate in purines you can have. Avoid foods high in purines. Grains  Foods low in purines: Enriched white bread, pasta, rice, cake, cornbread, popcorn.  Foods moderate in purines: Whole-grain breads and cereals, wheat germ, bran, oatmeal. Uncooked oatmeal. Dry wheat bran or wheat germ.  Foods high in purines: Pancakes, [place] toast, biscuits, muffins. Vegetables  Foods low in purines: All vegetables, except those that are moderate in purines.  Foods moderate in purines: Asparagus, cauliflower, spinach, mushrooms, green peas. Fruits  All fruits are low in purines. Meats and other Protein Foods  Foods low in purines: Eggs, nuts, peanut butter.  Foods moderate in purines: 80-90% lean beef, lamb, veal, pork, poultry, fish, eggs, peanut butter, nuts. Crab, lobster, oysters, and shrimp. Cooked dried beans, peas, and lentils.  Foods high in purines: Anchovies, sardines, herring, mussels, tuna, codfish, scallops, trout, and haddock. Berniece Salines. Organ meats (such as liver or kidney). Tripe. Game meat. Goose. Sweetbreads. Dairy  All dairy foods are low in purines. Low-fat and fat-free dairy products are best because they are low in saturated fat. Beverages  Drinks low in purines: Water, carbonated beverages, tea, coffee, cocoa.  Drinks moderate in purines: Soft drinks and other drinks sweetened with high-fructose corn syrup. Juices. To find  whether a food or drink is sweetened with high-fructose corn syrup, look at the ingredients list.  Drinks high in purines: Alcoholic beverages (such as beer). Condiments  Foods low in purines: Salt, herbs, olives, pickles, relishes, vinegar.  Foods moderate in purines: Butter, margarine, oils, mayonnaise. Fats and Oils  Foods low in purines: All types, except gravies and sauces made with meat.  Foods high in purines: Gravies and sauces made with meat. Other Foods  Foods low in purines: Sugars, sweets, gelatin. Cake. Soups made without meat.  Foods moderate in purines: Meat-based or fish-based soups, broths, or bouillons. Foods and drinks sweetened with high-fructose corn syrup.  Foods high in purines: High-fat desserts (such as ice cream, cookies, cakes, pies, doughnuts, and chocolate). Contact your dietitian for more information on foods that are not listed here. This information is not intended to replace advice given to you by your health care provider. Make sure you discuss any questions you have with your health care provider. Document Released: 06/17/2010 Document Revised: 07/29/2015 Document Reviewed: 01/27/2013 Elsevier Interactive Patient Education  2017 [employer].

## 2017-11-21 NOTE — Progress Notes (Signed)
Subjective:  Patient presents today for their Welcome to Medicare Exam    Preventive Screening-Counseling & Management  Vision screen: passed.  Wears glasses.  20/20 both eyes with glasses.  20/70 both eyes without glasses.  Hearing Screening   125Hz  250Hz  500Hz  1000Hz  2000Hz  3000Hz  4000Hz  6000Hz  8000Hz   Right ear:   Pass Pass Pass  Pass    Left ear:   Pass Pass Pass  Pass      Visual Acuity Screening   Right eye Left eye Both eyes  Without correction: 20/70 20/70 20/70   With correction: 20/20 20/20 20/20     Advanced directives: Pt has a living will that was done out of state.  He plans to meet with a lawyer here to have it updated.  Smoking Status: Never Smoker Second Hand Smoking status: No smokers in home  Risk Factors Regular exercise: Swims Diet: balanced.  Was doing the paradox diet, lost 40 lbs  Fall Risk: None  Fall Risk  11/01/2017  Falls in the past year? No   Opioid use history:  no long term opioids use  Cardiac risk factors:  advanced age (older than 27 for men, 57 for women)  Hyperlipidemia no.  Last lipid panel 09/2017 normal. No diabetes.  Family History:    Depression Screen None. PHQ2 0 Depression screen PHQ 2/9 11/01/2017  Decreased Interest 0  Down, Depressed, Hopeless 0  PHQ - 2 Score 0    Activities of Daily Living Independent ADLs and IADLs   Hearing Difficulties: none  Cognitive Testing             No reported trouble.    Normal 3 word recall  List the Names of Other Physician/Practitioners you currently use: -[facility], Dr. Beulah Gandy for BPH/H/o elevated PSA  Immunization History  Administered Date(s) Administered  . Tdap 09/14/2017   Required Immunizations needed--discussed influenza vaccine and PCV 13, however pt wishes to check with Medicare to see if they will pay for it.  Screening tests- up to date Health Maintenance Due  Topic Date Due  . Hepatitis C Screening  Apr 23, 1950  . HIV Screening  09/15/1967  . PNA vac Low  Risk Adult (1 of 2 - PCV13) 09/14/2017  . INFLUENZA VACCINE  10/04/2017    ROS- No pertinent positives discovered in course of AWV  The following were reviewed and entered/updated in epic: Past Medical History:  Diagnosis Date  . Elevated PSA   . Family history of prostate problems   . Frequent headaches   . GERD (gastroesophageal reflux disease)   . Hyperlipidemia   . Hypertension   . Migraines    There are no active problems to display for this patient.  Past Surgical History:  Procedure Laterality Date  . APPENDECTOMY    . CHOLECYSTECTOMY    . HERNIA REPAIR    . VASECTOMY      Family History  Problem Relation Age of Onset  . Hypertension Mother   . Cancer Father   . Diabetes Father   . Heart attack Father   . Heart disease Father   . Hyperlipidemia Father   . Hypertension Father   . Hypertension Brother   . Hyperlipidemia Brother   . Heart disease Brother   . Hearing loss Brother   . Early death Maternal Grandfather   . Diabetes Paternal Grandmother   . Early death Paternal Grandfather   . Heart disease Paternal Grandfather     Medications- reviewed and updated Current Outpatient Medications  Medication Sig Dispense Refill  . aspirin EC 81 MG tablet Take 81 mg by mouth daily.    . Potassium 99 MG TABS Take 99 mg by mouth daily.    . tamsulosin (FLOMAX) 0.4 MG CAPS capsule Take 0.4 mg by mouth.    . colchicine 0.6 MG tablet Take 2 pills immediately.  Take 1 pill 1 hour later.  Then take 1 pill daily until symptoms resolve. (Patient not taking: Reported on 11/21/2017) 30 tablet 0   No current facility-administered medications for this visit.     Allergies-reviewed and updated Allergies  Allergen Reactions  . Amoxicillin Other (See Comments)    "Cloudy headed" with long doses    Social History   Socioeconomic History  . Marital status: Married    Spouse name: Not on file  . Number of children: Not on file  . Years of education: Not on file  .  Highest education level: Not on file  Occupational History  . Not on file  Social Needs  . Financial resource strain: Not on file  . Food insecurity:    Worry: Not on file    Inability: Not on file  . Transportation needs:    Medical: Not on file    Non-medical: Not on file  Tobacco Use  . Smoking status: Former Research scientist (life sciences)  . Smokeless tobacco: Former Network engineer and Sexual Activity  . Alcohol use: Yes  . Drug use: Never  . Sexual activity: Not Currently  Lifestyle  . Physical activity:    Days per week: Not on file    Minutes per session: Not on file  . Stress: Not on file  Relationships  . Social connections:    Talks on phone: Not on file    Gets together: Not on file    Attends religious service: Not on file    Active member of club or organization: Not on file    Attends meetings of clubs or organizations: Not on file    Relationship status: Not on file  Other Topics Concern  . Not on file  Social History Narrative  . Not on file    Objective: BP 110/82 (BP Location: Left Arm, Patient Position: Sitting, Cuff Size: Normal)   Pulse 64   Temp 98.2 F (36.8 C) (Oral)   Ht 6' (1.829 m)   Wt 203 lb (92.1 kg)   SpO2 97%   BMI 27.53 kg/m  Gen: NAD, resting comfortably HEENT: Mucous membranes are moist. Oropharynx normal Neck: no thyromegaly CV: RRR no murmurs rubs or gallops Lungs: CTAB no crackles, wheeze, rhonchi Abdomen: soft/nontender/nondistended/normal bowel sounds. No rebound or guarding.  Ext: no edema Skin: warm, dry Neuro: grossly normal, moves all extremities, PERRLA  Assessment/Plan:  Welcome to Medicare exam completed- discussed recommended screenings and documented any personalized health advice and referrals for preventive counseling. See AVS as well which was given to patient.   Status of chronic or acute concerns  Stable. Gout flare resolving.  HTN: -controlled, not on meds  BPH: -flomax 0.4 mg daily  Migraines: -stable.  No recent  HAs  No problem-specific Assessment & Plan notes found for this encounter.   No future appointments. No follow-ups on file.   Lab/Order associations: Initial Medicare annual wellness visit  No orders of the defined types were placed in this encounter.   Return precautions advised. Billie Ruddy, MD

## 2017-11-27 DIAGNOSIS — R3912 Poor urinary stream: Secondary | ICD-10-CM | POA: Diagnosis not present

## 2017-11-27 DIAGNOSIS — N401 Enlarged prostate with lower urinary tract symptoms: Secondary | ICD-10-CM | POA: Diagnosis not present

## 2017-11-29 DIAGNOSIS — L570 Actinic keratosis: Secondary | ICD-10-CM | POA: Diagnosis not present

## 2017-12-25 DIAGNOSIS — Z23 Encounter for immunization: Secondary | ICD-10-CM | POA: Diagnosis not present

## 2017-12-25 DIAGNOSIS — L570 Actinic keratosis: Secondary | ICD-10-CM | POA: Diagnosis not present

## 2017-12-25 DIAGNOSIS — L818 Other specified disorders of pigmentation: Secondary | ICD-10-CM | POA: Diagnosis not present

## 2017-12-25 DIAGNOSIS — D1801 Hemangioma of skin and subcutaneous tissue: Secondary | ICD-10-CM | POA: Diagnosis not present

## 2018-01-30 ENCOUNTER — Encounter: Payer: Self-pay | Admitting: Family Medicine

## 2018-01-30 ENCOUNTER — Ambulatory Visit (INDEPENDENT_AMBULATORY_CARE_PROVIDER_SITE_OTHER): Payer: Medicare Other | Admitting: Family Medicine

## 2018-01-30 VITALS — BP 120/78 | HR 68 | Temp 98.5°F | Wt 214.0 lb

## 2018-01-30 DIAGNOSIS — I10 Essential (primary) hypertension: Secondary | ICD-10-CM | POA: Diagnosis not present

## 2018-01-30 NOTE — Progress Notes (Signed)
Subjective:    Patient ID: Ronald Olsen, male    DOB: [birth year], 67 y.o.   MRN: [id number]  No chief complaint on file.   HPI Patient was seen today for follow-up on chronic condition.  HTN: -Not currently on medication. -In the past was on Norvasc -Swimming for exercise, on the paradox diet (lost 40 pounds) -Checked BP at home using family member's wrist cuff. -Several readings were in the 409W systolic -Came in today for blood pressure check -Denies headaches, SOB, CP -Did get new prescription eyeglasses, endorses mild blurred   Past Medical History:  Diagnosis Date  . Elevated PSA   . Family history of prostate problems   . Frequent headaches   . GERD (gastroesophageal reflux disease)   . Hyperlipidemia   . Hypertension   . Migraines     Allergies  Allergen Reactions  . Amoxicillin Other (See Comments)    "Cloudy headed" with long doses    ROS General: Denies fever, chills, night sweats, changes in weight, changes in appetite HEENT: Denies headaches, ear pain, rhinorrhea, sore throat + changes in vision CV: Denies CP, palpitations, SOB, orthopnea Pulm: Denies SOB, cough, wheezing GI: Denies abdominal pain, nausea, vomiting, diarrhea, constipation GU: Denies dysuria, hematuria, frequency, vaginal discharge Msk: Denies muscle cramps, joint pains Neuro: Denies weakness, numbness, tingling Skin: Denies rashes, bruising Psych: Denies depression, anxiety, hallucinations    Objective:    Blood pressure 120/78, pulse 68, temperature 98.5 F (36.9 C), temperature source Oral, weight 214 lb (97.1 kg), SpO2 98 %. Repeat BP 122/70  Gen. Pleasant, well-nourished, in no distress, normal affect   HEENT: [place]/AT, face symmetric, no scleral icterus, PERRLA,  nares patent without drainage Lungs: no accessory muscle use, CTAB, no wheezes or rales Cardiovascular: RRR, no m/r/g, no peripheral edema Neuro:  A&Ox3, CN II-XII intact, normal gait  Wt Readings from Last 3  Encounters:  01/30/18 214 lb (97.1 kg)  11/21/17 203 lb (92.1 kg)  11/01/17 201 lb 4.8 oz (91.3 kg)    Lab Results  Component Value Date   WBC 7.7 09/14/2017   HGB 15.3 09/14/2017   HCT 45.0 09/14/2017   PLT 177.0 09/14/2017   GLUCOSE 107 (H) 09/14/2017   CHOL 143 09/14/2017   TRIG 94.0 09/14/2017   HDL 46.40 09/14/2017   LDLCALC 77 09/14/2017   NA 142 09/14/2017   K 4.7 09/14/2017   CL 106 09/14/2017   CREATININE 1.22 09/14/2017   BUN 23 09/14/2017   CO2 28 09/14/2017   PSA 2.32 09/14/2017    Assessment/Plan:  Essential hypertension -Controlled -Continue lifestyle modifications -discussed obtaining new bp cuff, not a wrist or finger cuff. -if elevated >140/90, pt to contact office.  F/u prn  Grier Mitts, MD

## 2018-05-21 ENCOUNTER — Other Ambulatory Visit: Payer: Self-pay

## 2018-05-21 ENCOUNTER — Encounter: Payer: Self-pay | Admitting: Family Medicine

## 2018-05-21 ENCOUNTER — Ambulatory Visit (INDEPENDENT_AMBULATORY_CARE_PROVIDER_SITE_OTHER): Payer: Medicare Other | Admitting: Family Medicine

## 2018-05-21 VITALS — BP 98/60 | HR 84 | Temp 98.9°F | Wt 214.0 lb

## 2018-05-21 DIAGNOSIS — R0789 Other chest pain: Secondary | ICD-10-CM | POA: Diagnosis not present

## 2018-05-21 DIAGNOSIS — K219 Gastro-esophageal reflux disease without esophagitis: Secondary | ICD-10-CM | POA: Diagnosis not present

## 2018-05-21 MED ORDER — PANTOPRAZOLE SODIUM 40 MG PO TBEC: 40.0000 mg | DELAYED_RELEASE_TABLET | Freq: Every day | ORAL | 3 refills | Status: DC

## 2018-05-21 NOTE — Patient Instructions (Signed)
Gastroesophageal Reflux Disease, Adult Gastroesophageal reflux (GER) happens when acid from the stomach flows up into the tube that connects the mouth and the stomach (esophagus). Normally, food travels down the esophagus and stays in the stomach to be digested. With GER, food and stomach acid sometimes move back up into the esophagus. You may have a disease called gastroesophageal reflux disease (GERD) if the reflux:  Happens often.  Causes frequent or very bad symptoms.  Causes problems such as damage to the esophagus. When this happens, the esophagus becomes sore and swollen (inflamed). Over time, GERD can make small holes (ulcers) in the lining of the esophagus. What are the causes? This condition is caused by a problem with the muscle between the esophagus and the stomach. When this muscle is weak or not normal, it does not close properly to keep food and acid from coming back up from the stomach. The muscle can be weak because of:  Tobacco use.  Pregnancy.  Having a certain type of hernia (hiatal hernia).  Alcohol use.  Certain foods and drinks, such as coffee, chocolate, onions, and peppermint. What increases the risk? You are more likely to develop this condition if you:  Are overweight.  Have a disease that affects your connective tissue.  Use NSAID medicines. What are the signs or symptoms? Symptoms of this condition include:  Heartburn.  Difficult or painful swallowing.  The feeling of having a lump in the throat.  A bitter taste in the mouth.  Bad breath.  Having a lot of saliva.  Having an upset or bloated stomach.  Belching.  Chest pain. Different conditions can cause chest pain. Make sure you see your doctor if you have chest pain.  Shortness of breath or noisy breathing (wheezing).  Ongoing (chronic) cough or a cough at night.  Wearing away of the surface of teeth (tooth enamel).  Weight loss. How is this treated? Treatment will depend on how  bad your symptoms are. Your doctor may suggest:  Changes to your diet.  Medicine.  Surgery. Follow these instructions at home: Eating and drinking   Follow a diet as told by your doctor. You may need to avoid foods and drinks such as: ? Coffee and tea (with or without caffeine). ? Drinks that contain alcohol. ? Energy drinks and sports drinks. ? Bubbly (carbonated) drinks or sodas. ? Chocolate and cocoa. ? Peppermint and mint flavorings. ? Garlic and onions. ? Horseradish. ? Spicy and acidic foods. These include peppers, chili powder, curry powder, vinegar, hot sauces, and BBQ sauce. ? Citrus fruit juices and citrus fruits, such as oranges, lemons, and limes. ? Tomato-based foods. These include red sauce, chili, salsa, and pizza with red sauce. ? Fried and fatty foods. These include donuts, french fries, potato chips, and high-fat dressings. ? High-fat meats. These include hot dogs, rib eye steak, sausage, ham, and bacon. ? High-fat dairy items, such as whole milk, butter, and cream cheese.  Eat small meals often. Avoid eating large meals.  Avoid drinking large amounts of liquid with your meals.  Avoid eating meals during the 2-3 hours before bedtime.  Avoid lying down right after you eat.  Do not exercise right after you eat. Lifestyle   Do not use any products that contain nicotine or tobacco. These include cigarettes, e-cigarettes, and chewing tobacco. If you need help quitting, ask your doctor.  Try to lower your stress. If you need help doing this, ask your doctor.  If you are overweight, lose an amount   of weight that is healthy for you. Ask your doctor about a safe weight loss goal. General instructions  Pay attention to any changes in your symptoms.  Take over-the-counter and prescription medicines only as told by your doctor. Do not take aspirin, ibuprofen, or other NSAIDs unless your doctor says it is okay.  Wear loose clothes. Do not wear anything tight  around your waist.  Raise (elevate) the head of your bed about 6 inches (15 cm).  Avoid bending over if this makes your symptoms worse.  Keep all follow-up visits as told by your doctor. This is important. Contact a doctor if:  You have new symptoms.  You lose weight and you do not know why.  You have trouble swallowing or it hurts to swallow.  You have wheezing or a cough that keeps happening.  Your symptoms do not get better with treatment.  You have a hoarse voice. Get help right away if:  You have pain in your arms, neck, jaw, teeth, or back.  You feel sweaty, dizzy, or light-headed.  You have chest pain or shortness of breath.  You throw up (vomit) and your throw-up looks like blood or coffee grounds.  You pass out (faint).  Your poop (stool) is bloody or black.  You cannot swallow, drink, or eat. Summary  If a person has gastroesophageal reflux disease (GERD), food and stomach acid move back up into the esophagus and cause symptoms or problems such as damage to the esophagus.  Treatment will depend on how bad your symptoms are.  Follow a diet as told by your doctor.  Take all medicines only as told by your doctor. This information is not intended to replace advice given to you by your health care provider. Make sure you discuss any questions you have with your health care provider. Document Released: 08/09/2007 Document Revised: 08/29/2017 Document Reviewed: 08/29/2017 Elsevier Interactive Patient Education  2019 [employer] for Gastroesophageal Reflux Disease, Adult When you have gastroesophageal reflux disease (GERD), the foods you eat and your eating habits are very important. Choosing the right foods can help ease your discomfort. Think about working with a nutrition specialist (dietitian) to help you make good choices. What are tips for following this plan?  Meals  Choose healthy foods that are low in fat, such as fruits, vegetables,  whole grains, low-fat dairy products, and lean meat, fish, and poultry.  Eat small meals often instead of 3 large meals a day. Eat your meals slowly, and in a place where you are relaxed. Avoid bending over or lying down until 2-3 hours after eating.  Avoid eating meals 2-3 hours before bed.  Avoid drinking a lot of liquid with meals.  Cook foods using methods other than frying. Bake, grill, or broil food instead.  Avoid or limit: ? Chocolate. ? Peppermint or spearmint. ? Alcohol. ? Pepper. ? Black and decaffeinated coffee. ? Black and decaffeinated tea. ? Bubbly (carbonated) soft drinks. ? Caffeinated energy drinks and soft drinks.  Limit high-fat foods such as: ? Fatty meat or fried foods. ? Whole milk, cream, butter, or ice cream. ? Nuts and nut butters. ? Pastries, donuts, and sweets made with butter or shortening.  Avoid foods that cause symptoms. These foods may be different for everyone. Common foods that cause symptoms include: ? Tomatoes. ? Oranges, lemons, and limes. ? Peppers. ? Spicy food. ? Onions and garlic. ? Vinegar. Lifestyle  Maintain a healthy weight. Ask your doctor what weight is healthy  for you. If you need to lose weight, work with your doctor to do so safely.  Exercise for at least 30 minutes for 5 or more days each week, or as told by your doctor.  Wear loose-fitting clothes.  Do not smoke. If you need help quitting, ask your doctor.  Sleep with the head of your bed higher than your feet. Use a wedge under the mattress or blocks under the bed frame to raise the head of the bed. Summary  When you have gastroesophageal reflux disease (GERD), food and lifestyle choices are very important in easing your symptoms.  Eat small meals often instead of 3 large meals a day. Eat your meals slowly, and in a place where you are relaxed.  Limit high-fat foods such as fatty meat or fried foods.  Avoid bending over or lying down until 2-3 hours after  eating.  Avoid peppermint and spearmint, caffeine, alcohol, and chocolate. This information is not intended to replace advice given to you by your health care provider. Make sure you discuss any questions you have with your health care provider. Document Released: 08/22/2011 Document Revised: 03/28/2016 Document Reviewed: 03/28/2016 Elsevier Interactive Patient Education  2019 [employer]  Nonspecific Chest Pain, Adult Chest pain can be caused by many different conditions. It can be caused by a condition that is life-threatening and requires treatment right away. It can also be caused by something that is not life-threatening. If you have chest pain, it can be hard to know the difference, so it is important to get help right away to make sure that you do not have a serious condition. Some life-threatening causes of chest pain include:  Heart attack.  A tear in the body's main blood vessel (aortic dissection).  Inflammation around your heart (pericarditis).  A problem in the lungs, such as a blood clot (pulmonary embolism) or a collapsed lung (pneumothorax). Some non life-threatening causes of chest pain include:  Heartburn.  Anxiety or stress.  Damage to the bones, muscles, and cartilage that make up your chest wall.  Pneumonia or bronchitis.  Shingles infection (varicella-zoster virus). Chest pain can feel like:  Pain or discomfort on the surface of your chest or deep in your chest.  Crushing, pressure, aching, or squeezing pain.  Burning or tingling.  Dull or sharp pain that is worse when you move, cough, or take a deep breath.  Pain or discomfort that is also felt in your back, neck, jaw, shoulder, or arm, or pain that spreads to any of these areas. Your chest pain may come and go. It may also be constant. Your health care provider will do lab tests and other studies to find the cause of your pain. Treatment will depend on the cause of your chest pain. Follow these  instructions at home: Medicines  Take over-the-counter and prescription medicines only as told by your health care provider.  If you were prescribed an antibiotic, take it as told by your health care provider. Do not stop taking the antibiotic even if you start to feel better. Lifestyle   Rest as directed by your health care provider.  Do not use any products that contain nicotine or tobacco, such as cigarettes and e-cigarettes. If you need help quitting, ask your health care provider.  Do not drink alcohol.  Make healthy lifestyle choices as recommended. These may include: ? Getting regular exercise. Ask your health care provider to suggest some activities that are safe for you. ? Eating a heart-healthy diet. This  includes plenty of fresh fruits and vegetables, whole grains, low-fat (lean) protein, and low-fat dairy products. A dietitian can help you find healthy eating options. ? Maintaining a healthy weight. ? Managing any other health conditions you have, such as high blood pressure (hypertension) or diabetes. ? Reducing stress, such as with yoga or relaxation techniques. General instructions  Pay attention to any changes in your symptoms. Tell your health care provider about them or any new symptoms.  Avoid any activities that cause chest pain.  Keep all follow-up visits as told by your health care provider. This is important. This includes visits for any further testing if your chest pain does not go away. Contact a health care provider if:  Your chest pain does not go away.  You feel depressed.  You have a fever. Get help right away if:  Your chest pain gets worse.  You have a cough that gets worse, or you cough up blood.  You have severe pain in your abdomen.  You faint.  You have sudden, unexplained chest discomfort.  You have sudden, unexplained discomfort in your arms, back, neck, or jaw.  You have shortness of breath at any time.  You suddenly start to  sweat, or your skin gets clammy.  You feel nausea or you vomit.  You suddenly feel lightheaded or dizzy.  You have severe weakness, or unexplained weakness or fatigue.  Your heart begins to beat quickly, or it feels like it is skipping beats. These symptoms may represent a serious problem that is an emergency. Do not wait to see if the symptoms will go away. Get medical help right away. Call your local emergency services (911 in the [place] Do not drive yourself to the hospital. Summary  Chest pain can be caused by a condition that is serious and requires urgent treatment. It may also be caused by something that is not life-threatening.  If you have chest pain, it is very important to see your health care provider. Your health care provider may do lab tests and other studies to find the cause of your pain.  Follow your health care provider's instructions on taking medicines, making lifestyle changes, and getting emergency treatment if symptoms become worse.  Keep all follow-up visits as told by your health care provider. This includes visits for any further testing if your chest pain does not go away. This information is not intended to replace advice given to you by your health care provider. Make sure you discuss any questions you have with your health care provider. Document Released: 11/30/2004 Document Revised: 08/23/2017 Document Reviewed: 08/23/2017 Elsevier Interactive Patient Education  2019 [employer].

## 2018-05-21 NOTE — Progress Notes (Signed)
Subjective:    Patient ID: Ronald Olsen, male    DOB: 1950-07-01, 68 y.o.   MRN: [id number]  No chief complaint on file.   HPI Patient was seen today for acute concern.  Patient endorses chest wall discomfort x1 month.  Patient notes discomfort has become worse.  Patient notes similar pain in the past for which she was prescribed gabapentin.  Pt states pain starts in his thoracic mid back and radiates to front of chest.  Pt also notes increase in heartburn.  In the past patient was on omeprazole.  Endorses pain with swallowing and burning sensation in her chest.  Pt started taking aspirin 81 mg for his symptoms.  Pt expresses concern as his father had heart dz. patient denies nausea, vomiting, headache, chest pressure, numbness in face or upper extremities.  Patient denies heavy lifting pushing/pulling.  Past Medical History:  Diagnosis Date  . Elevated PSA   . Family history of prostate problems   . Frequent headaches   . GERD (gastroesophageal reflux disease)   . Hyperlipidemia   . Hypertension   . Migraines     Allergies  Allergen Reactions  . Amoxicillin Other (See Comments)    "Cloudy headed" with long doses    ROS General: Denies fever, chills, night sweats, changes in weight, changes in appetite HEENT: Denies headaches, ear pain, changes in vision, rhinorrhea, sore throat  + pain with swallowing CV: Denies CP, palpitations, SOB, orthopnea Pulm: Denies SOB, cough, wheezing GI: Denies abdominal pain, nausea, vomiting, diarrhea, constipation  + burning in chest GU: Denies dysuria, hematuria, frequency Msk: Denies muscle cramps, joint pains  + rib cage discomfort Neuro: Denies weakness, numbness, tingling Skin: Denies rashes, bruising Psych: Denies depression, anxiety, hallucination    Objective:    Blood pressure 98/60, pulse 84, temperature 98.9 F (37.2 C), temperature source Oral, weight 214 lb (97.1 kg), SpO2 97 %.  Gen. Pleasant, well-nourished, in no  distress, normal affect, anxious HEENT: [place]/AT, face symmetric, no scleral icterus, PERRLA, nares patent without drainage, pharynx without erythema or exudate. Neck: No JVD, no thyromegaly, no carotid bruits Lungs: no accessory muscle use, CTAB, no wheezes or rales Cardiovascular: RRR, no m/r/g, no peripheral edema Abdomen: BS present, soft, NT/ND, no hepatosplenomegaly. MSK: no TTP of chest wall or spine. Neuro:  A&Ox3, CN II-XII intact, normal gait  Wt Readings from Last 3 Encounters:  05/21/18 214 lb (97.1 kg)  01/30/18 214 lb (97.1 kg)  11/21/17 203 lb (92.1 kg)    Lab Results  Component Value Date   WBC 7.7 09/14/2017   HGB 15.3 09/14/2017   HCT 45.0 09/14/2017   PLT 177.0 09/14/2017   GLUCOSE 107 (H) 09/14/2017   CHOL 143 09/14/2017   TRIG 94.0 09/14/2017   HDL 46.40 09/14/2017   LDLCALC 77 09/14/2017   NA 142 09/14/2017   K 4.7 09/14/2017   CL 106 09/14/2017   CREATININE 1.22 09/14/2017   BUN 23 09/14/2017   CO2 28 09/14/2017   PSA 2.32 09/14/2017    Assessment/Plan:  Gastroesophageal reflux disease, esophagitis presence not specified  -Discussed restarting PPI -Given handout -Discussed avoiding foods known to cause symptoms -Patient encouraged to keep a food diary - Plan: pantoprazole (PROTONIX) 40 MG tablet  Chest discomfort -Discussed various causes including GERD, musculoskeletal, CAD related, anxiety.  No red flag symptoms noted at this time -Discussed restarting PPI.  If symptoms continue patient is to notify clinic immediately -Consider referral to cardiology for stress test. -Given handout -Given  RTC or ED precautions  F/u PRN in the next 1 week  Grier Mitts, MD

## 2018-05-23 ENCOUNTER — Encounter: Payer: Self-pay | Admitting: Family Medicine

## 2018-08-23 ENCOUNTER — Other Ambulatory Visit: Payer: Self-pay | Admitting: Family Medicine

## 2018-08-23 DIAGNOSIS — K219 Gastro-esophageal reflux disease without esophagitis: Secondary | ICD-10-CM

## 2018-10-14 DIAGNOSIS — R079 Chest pain, unspecified: Secondary | ICD-10-CM | POA: Diagnosis not present

## 2018-10-14 DIAGNOSIS — I129 Hypertensive chronic kidney disease with stage 1 through stage 4 chronic kidney disease, or unspecified chronic kidney disease: Secondary | ICD-10-CM | POA: Diagnosis not present

## 2018-10-14 DIAGNOSIS — E785 Hyperlipidemia, unspecified: Secondary | ICD-10-CM | POA: Diagnosis not present

## 2018-10-14 DIAGNOSIS — K219 Gastro-esophageal reflux disease without esophagitis: Secondary | ICD-10-CM | POA: Diagnosis not present

## 2018-10-14 DIAGNOSIS — G43909 Migraine, unspecified, not intractable, without status migrainosus: Secondary | ICD-10-CM | POA: Diagnosis not present

## 2018-10-14 DIAGNOSIS — I10 Essential (primary) hypertension: Secondary | ICD-10-CM | POA: Diagnosis not present

## 2018-10-14 DIAGNOSIS — N401 Enlarged prostate with lower urinary tract symptoms: Secondary | ICD-10-CM | POA: Diagnosis not present

## 2018-10-14 DIAGNOSIS — N183 Chronic kidney disease, stage 3 (moderate): Secondary | ICD-10-CM | POA: Diagnosis not present

## 2018-10-14 DIAGNOSIS — M109 Gout, unspecified: Secondary | ICD-10-CM | POA: Diagnosis not present

## 2018-10-14 DIAGNOSIS — R972 Elevated prostate specific antigen [PSA]: Secondary | ICD-10-CM | POA: Diagnosis not present

## 2018-10-14 DIAGNOSIS — Z136 Encounter for screening for cardiovascular disorders: Secondary | ICD-10-CM | POA: Diagnosis not present

## 2018-12-10 DIAGNOSIS — I10 Essential (primary) hypertension: Secondary | ICD-10-CM | POA: Diagnosis not present

## 2018-12-10 DIAGNOSIS — R079 Chest pain, unspecified: Secondary | ICD-10-CM | POA: Diagnosis not present

## 2018-12-11 DIAGNOSIS — I10 Essential (primary) hypertension: Secondary | ICD-10-CM | POA: Diagnosis not present

## 2018-12-11 DIAGNOSIS — R079 Chest pain, unspecified: Secondary | ICD-10-CM | POA: Diagnosis not present

## 2019-01-07 DIAGNOSIS — R079 Chest pain, unspecified: Secondary | ICD-10-CM | POA: Diagnosis not present

## 2019-01-07 DIAGNOSIS — I10 Essential (primary) hypertension: Secondary | ICD-10-CM | POA: Diagnosis not present

## 2019-01-22 DIAGNOSIS — E782 Mixed hyperlipidemia: Secondary | ICD-10-CM | POA: Diagnosis not present

## 2019-01-22 DIAGNOSIS — I10 Essential (primary) hypertension: Secondary | ICD-10-CM | POA: Diagnosis not present

## 2019-01-22 DIAGNOSIS — R079 Chest pain, unspecified: Secondary | ICD-10-CM | POA: Diagnosis not present

## 2019-02-01 DIAGNOSIS — Z01818 Encounter for other preprocedural examination: Secondary | ICD-10-CM | POA: Diagnosis not present

## 2019-02-01 DIAGNOSIS — I480 Paroxysmal atrial fibrillation: Secondary | ICD-10-CM | POA: Diagnosis not present

## 2019-02-04 DIAGNOSIS — I2510 Atherosclerotic heart disease of native coronary artery without angina pectoris: Secondary | ICD-10-CM | POA: Diagnosis not present

## 2019-02-04 DIAGNOSIS — Z79899 Other long term (current) drug therapy: Secondary | ICD-10-CM | POA: Diagnosis not present

## 2019-02-04 DIAGNOSIS — M109 Gout, unspecified: Secondary | ICD-10-CM | POA: Diagnosis not present

## 2019-02-04 DIAGNOSIS — N400 Enlarged prostate without lower urinary tract symptoms: Secondary | ICD-10-CM | POA: Diagnosis not present

## 2019-02-04 DIAGNOSIS — Z881 Allergy status to other antibiotic agents status: Secondary | ICD-10-CM | POA: Diagnosis not present

## 2019-02-04 DIAGNOSIS — E7800 Pure hypercholesterolemia, unspecified: Secondary | ICD-10-CM | POA: Diagnosis not present

## 2019-02-04 DIAGNOSIS — Z87891 Personal history of nicotine dependence: Secondary | ICD-10-CM | POA: Diagnosis not present

## 2019-02-04 DIAGNOSIS — Z7982 Long term (current) use of aspirin: Secondary | ICD-10-CM | POA: Diagnosis not present

## 2019-02-05 DIAGNOSIS — Z87891 Personal history of nicotine dependence: Secondary | ICD-10-CM | POA: Diagnosis not present

## 2019-02-05 DIAGNOSIS — E669 Obesity, unspecified: Secondary | ICD-10-CM | POA: Diagnosis not present

## 2019-02-05 DIAGNOSIS — Z6830 Body mass index (BMI) 30.0-30.9, adult: Secondary | ICD-10-CM | POA: Diagnosis not present

## 2019-02-05 DIAGNOSIS — I10 Essential (primary) hypertension: Secondary | ICD-10-CM | POA: Diagnosis not present

## 2019-02-05 DIAGNOSIS — Z801 Family history of malignant neoplasm of trachea, bronchus and lung: Secondary | ICD-10-CM | POA: Diagnosis not present

## 2019-02-05 DIAGNOSIS — Z79899 Other long term (current) drug therapy: Secondary | ICD-10-CM | POA: Diagnosis not present

## 2019-02-05 DIAGNOSIS — Z7982 Long term (current) use of aspirin: Secondary | ICD-10-CM | POA: Diagnosis not present

## 2019-02-05 DIAGNOSIS — E7800 Pure hypercholesterolemia, unspecified: Secondary | ICD-10-CM | POA: Diagnosis not present

## 2019-02-05 DIAGNOSIS — Z880 Allergy status to penicillin: Secondary | ICD-10-CM | POA: Diagnosis not present

## 2019-02-05 DIAGNOSIS — I2510 Atherosclerotic heart disease of native coronary artery without angina pectoris: Secondary | ICD-10-CM | POA: Diagnosis not present

## 2019-02-05 DIAGNOSIS — Z8249 Family history of ischemic heart disease and other diseases of the circulatory system: Secondary | ICD-10-CM | POA: Diagnosis not present

## 2019-02-05 DIAGNOSIS — Z791 Long term (current) use of non-steroidal anti-inflammatories (NSAID): Secondary | ICD-10-CM | POA: Diagnosis not present

## 2019-02-10 DIAGNOSIS — R3912 Poor urinary stream: Secondary | ICD-10-CM | POA: Diagnosis not present

## 2019-02-10 DIAGNOSIS — N401 Enlarged prostate with lower urinary tract symptoms: Secondary | ICD-10-CM | POA: Diagnosis not present

## 2019-03-11 DIAGNOSIS — R079 Chest pain, unspecified: Secondary | ICD-10-CM | POA: Diagnosis not present

## 2019-03-11 DIAGNOSIS — I129 Hypertensive chronic kidney disease with stage 1 through stage 4 chronic kidney disease, or unspecified chronic kidney disease: Secondary | ICD-10-CM | POA: Diagnosis not present

## 2019-03-11 DIAGNOSIS — Z955 Presence of coronary angioplasty implant and graft: Secondary | ICD-10-CM | POA: Diagnosis not present

## 2019-03-11 DIAGNOSIS — I25110 Atherosclerotic heart disease of native coronary artery with unstable angina pectoris: Secondary | ICD-10-CM | POA: Diagnosis not present

## 2019-03-11 DIAGNOSIS — N189 Chronic kidney disease, unspecified: Secondary | ICD-10-CM | POA: Diagnosis not present

## 2019-03-11 DIAGNOSIS — K219 Gastro-esophageal reflux disease without esophagitis: Secondary | ICD-10-CM | POA: Diagnosis not present

## 2019-03-11 DIAGNOSIS — N401 Enlarged prostate with lower urinary tract symptoms: Secondary | ICD-10-CM | POA: Diagnosis not present

## 2019-03-20 DIAGNOSIS — I200 Unstable angina: Secondary | ICD-10-CM | POA: Diagnosis not present

## 2019-03-20 DIAGNOSIS — R079 Chest pain, unspecified: Secondary | ICD-10-CM | POA: Diagnosis not present

## 2019-05-28 DIAGNOSIS — I25118 Atherosclerotic heart disease of native coronary artery with other forms of angina pectoris: Secondary | ICD-10-CM | POA: Diagnosis not present

## 2019-05-28 DIAGNOSIS — I10 Essential (primary) hypertension: Secondary | ICD-10-CM | POA: Diagnosis not present

## 2019-05-28 DIAGNOSIS — E782 Mixed hyperlipidemia: Secondary | ICD-10-CM | POA: Diagnosis not present

## 2019-06-12 ENCOUNTER — Encounter (HOSPITAL_COMMUNITY): Payer: Self-pay

## 2019-06-12 ENCOUNTER — Other Ambulatory Visit: Payer: Self-pay

## 2019-06-12 ENCOUNTER — Emergency Department (HOSPITAL_COMMUNITY)
Admission: EM | Admit: 2019-06-12 | Discharge: 2019-06-13 | Disposition: A | Discharge status: Home / Self Care | Payer: Medicare Other | Attending: Emergency Medicine | Admitting: Emergency Medicine

## 2019-06-12 DIAGNOSIS — R339 Retention of urine, unspecified: Secondary | ICD-10-CM

## 2019-06-12 DIAGNOSIS — R338 Other retention of urine: Secondary | ICD-10-CM | POA: Diagnosis not present

## 2019-06-12 DIAGNOSIS — R140 Abdominal distension (gaseous): Secondary | ICD-10-CM | POA: Insufficient documentation

## 2019-06-12 DIAGNOSIS — R310 Gross hematuria: Secondary | ICD-10-CM

## 2019-06-12 DIAGNOSIS — Z79899 Other long term (current) drug therapy: Secondary | ICD-10-CM | POA: Diagnosis not present

## 2019-06-12 DIAGNOSIS — R300 Dysuria: Secondary | ICD-10-CM | POA: Diagnosis not present

## 2019-06-12 DIAGNOSIS — I10 Essential (primary) hypertension: Secondary | ICD-10-CM | POA: Diagnosis not present

## 2019-06-12 DIAGNOSIS — Z7982 Long term (current) use of aspirin: Secondary | ICD-10-CM | POA: Diagnosis not present

## 2019-06-12 DIAGNOSIS — Z87891 Personal history of nicotine dependence: Secondary | ICD-10-CM | POA: Insufficient documentation

## 2019-06-12 DIAGNOSIS — N401 Enlarged prostate with lower urinary tract symptoms: Secondary | ICD-10-CM | POA: Diagnosis not present

## 2019-06-12 DIAGNOSIS — R319 Hematuria, unspecified: Secondary | ICD-10-CM | POA: Diagnosis not present

## 2019-06-12 DIAGNOSIS — R350 Frequency of micturition: Secondary | ICD-10-CM | POA: Diagnosis not present

## 2019-06-12 DIAGNOSIS — R3912 Poor urinary stream: Secondary | ICD-10-CM | POA: Diagnosis not present

## 2019-06-12 NOTE — ED Triage Notes (Signed)
Pt arrives to ED w/ c/o hematuria. Pt was experience difficulty urinating and went to urologist today and they placed a catheter. Pt presents to the ED now as catheter is full of blood. Pt reports 8/10 pain in genitals.

## 2019-06-13 ENCOUNTER — Emergency Department (HOSPITAL_COMMUNITY): Payer: Medicare Other

## 2019-06-13 DIAGNOSIS — R140 Abdominal distension (gaseous): Secondary | ICD-10-CM | POA: Diagnosis not present

## 2019-06-13 DIAGNOSIS — R310 Gross hematuria: Secondary | ICD-10-CM | POA: Diagnosis not present

## 2019-06-13 DIAGNOSIS — I10 Essential (primary) hypertension: Secondary | ICD-10-CM | POA: Diagnosis not present

## 2019-06-13 DIAGNOSIS — R319 Hematuria, unspecified: Secondary | ICD-10-CM | POA: Diagnosis not present

## 2019-06-13 LAB — CBC
HCT: 49.3 % (ref 39.0–52.0)
Hemoglobin: 16.4 g/dL (ref 13.0–17.0)
MCH: 30.9 pg (ref 26.0–34.0)
MCHC: 33.3 g/dL (ref 30.0–36.0)
MCV: 93 fL (ref 80.0–100.0)
Platelets: 238 10*3/uL (ref 150–400)
RBC: 5.3 MIL/uL (ref 4.22–5.81)
RDW: 12.6 % (ref 11.5–15.5)
WBC: 19.7 10*3/uL — ABNORMAL HIGH (ref 4.0–10.5)
nRBC: 0 % (ref 0.0–0.2)

## 2019-06-13 LAB — URINALYSIS, MICROSCOPIC (REFLEX): RBC / HPF: >50 RBC/hpf (ref 0–5)

## 2019-06-13 LAB — URINE CULTURE: Culture: NO GROWTH

## 2019-06-13 LAB — LACTIC ACID, PLASMA
Lactic Acid, Venous: 1 mmol/L (ref 0.5–1.9)
Lactic Acid, Venous: 0.9 mmol/L (ref 0.5–1.9)

## 2019-06-13 LAB — URINALYSIS, ROUTINE W REFLEX MICROSCOPIC

## 2019-06-13 LAB — BASIC METABOLIC PANEL
Anion gap: 11 (ref 5–15)
BUN: 21 mg/dL (ref 8–23)
CO2: 25 mmol/L (ref 22–32)
Calcium: 9.1 mg/dL (ref 8.9–10.3)
Chloride: 107 mmol/L (ref 98–111)
Creatinine, Ser: 1.41 mg/dL — ABNORMAL HIGH (ref 0.61–1.24)
GFR calc Af Amer: 60 mL/min — ABNORMAL LOW (ref >60)
GFR calc non Af Amer: 52 mL/min — ABNORMAL LOW (ref >60)
Glucose, Bld: 115 mg/dL — ABNORMAL HIGH (ref 70–99)
Potassium: 4.6 mmol/L (ref 3.5–5.1)
Sodium: 143 mmol/L (ref 135–145)

## 2019-06-13 MED ORDER — OXYCODONE-ACETAMINOPHEN 5-325 MG PO TABS
1.0000 | ORAL_TABLET | Freq: Once | ORAL | Status: AC
  Administered 2019-06-13: 1 via ORAL
  Filled 2019-06-13: qty 1

## 2019-06-13 MED ORDER — MORPHINE SULFATE (PF) 4 MG/ML IV SOLN
4.0000 mg | Freq: Once | INTRAVENOUS | Status: AC
  Administered 2019-06-13: 4 mg via INTRAVENOUS
  Filled 2019-06-13: qty 1

## 2019-06-13 MED ORDER — HYDROMORPHONE HCL 1 MG/ML IJ SOLN
1.0000 mg | Freq: Once | INTRAMUSCULAR | Status: AC
  Administered 2019-06-13: 1 mg via INTRAVENOUS
  Filled 2019-06-13: qty 1

## 2019-06-13 MED ORDER — HYDROCODONE-ACETAMINOPHEN 5-325 MG PO TABS: 1.0000 | ORAL_TABLET | Freq: Four times a day (QID) | ORAL | 0 refills | Status: DC | PRN

## 2019-06-13 MED ORDER — IOHEXOL 300 MG/ML  SOLN
100.0000 mL | Freq: Once | INTRAMUSCULAR | Status: AC | PRN
  Administered 2019-06-13: 100 mL via INTRAVENOUS

## 2019-06-13 NOTE — Discharge Instructions (Addendum)
As discussed, please call your urologist today for further evaluation. Your bleeding may continue for a few days since you are on Plavix, but continue all your medications as prescribed. You may stop taking doxycycline because your urine culture was negative. You may take over the counter Tylenol as needed for mild to moderate pain. I am sending you home with some stronger medications, save for severe pain. Medication can cause drowsiness, so do not drive or operate machinery while on the medication. Return to the ER for new or worsening symptoms.

## 2019-06-13 NOTE — ED Notes (Signed)
Cath irriagated with 100 cc prior to Dc.  Urine ran clear without clots.  Teaching done for irrigation at home, cath care and bag maintenance.  Husband and wife verbally understanding.

## 2019-06-13 NOTE — ED Notes (Addendum)
Pt restful without acute needs  Wife at bedside. Tolerated irrigation without increased pain or pressure noted.  Irrigated with 100 cc without issue

## 2019-06-13 NOTE — ED Provider Notes (Signed)
[facility] EMERGENCY DEPARTMENT Provider Note   CSN: [id number] Arrival date & time: 06/12/19  2005     History Chief Complaint  Patient presents with  . Hematuria    Ronald Olsen is a 69 y.o. male.  Patient to ED with painful abdominal distention and hematuria. He was seen by Dr. Junious Silk yesterday in the office and had a foley placed for urinary retention. The patient reports a long history of prostate enlargement and progressive difficulty passing urine. He denies history of obstruction in the past. Tonight he started passing bloody urine and feels his bladder is not emptying causing distention and increasing pain. No fever, nausea, vomiting. No blood reported around the urethral meatus. No testicular pain.  The history is provided by the patient. No language interpreter was used.  Hematuria Associated symptoms include abdominal pain.       Past Medical History:  Diagnosis Date  . Elevated PSA   . Family history of prostate problems   . Frequent headaches   . GERD (gastroesophageal reflux disease)   . Hyperlipidemia   . Hypertension   . Migraines     There are no problems to display for this patient.   Past Surgical History:  Procedure Laterality Date  . APPENDECTOMY    . CHOLECYSTECTOMY    . HERNIA REPAIR    . VASECTOMY         Family History  Problem Relation Age of Onset  . Hypertension Mother   . Cancer Father   . Diabetes Father   . Heart attack Father   . Heart disease Father   . Hyperlipidemia Father   . Hypertension Father   . Hypertension Brother   . Hyperlipidemia Brother   . Heart disease Brother   . Hearing loss Brother   . Early death Maternal Grandfather   . Diabetes Paternal Grandmother   . Early death Paternal Grandfather   . Heart disease Paternal Grandfather     Social History   Tobacco Use  . Smoking status: Former Research scientist (life sciences)  . Smokeless tobacco: Former Network engineer Use Topics  . Alcohol use: Yes   . Drug use: Never    Home Medications Prior to Admission medications   Medication Sig Start Date End Date Taking? Authorizing Provider  alfuzosin (UROXATRAL) 10 MG 24 hr tablet Take 10 mg by mouth daily with breakfast.    [provider]  aspirin EC 81 MG tablet Take 81 mg by mouth daily.    [provider]  pantoprazole (PROTONIX) 40 MG tablet TAKE 1 TABLET BY MOUTH ONCE DAILY 08/26/18   Billie Ruddy, MD  Potassium 99 MG TABS Take 99 mg by mouth daily.    [provider]    Allergies    Amoxicillin  Review of Systems   Review of Systems  Constitutional: Negative for chills and fever.  Respiratory: Negative.   Cardiovascular: Negative.   Gastrointestinal: Positive for abdominal pain. Negative for nausea.  Genitourinary: Positive for hematuria.       See HPI.  Musculoskeletal: Negative.  Negative for back pain.  Skin: Negative.   Neurological: Negative.     Physical Exam Updated Vital Signs BP (!) 152/102 (BP Location: Right Arm)   Pulse (!) 104   Temp 99.1 F (37.3 C) (Oral)   Resp (!) 23   SpO2 96%   Physical Exam Vitals and nursing note reviewed.  Constitutional:      Appearance: He is well-developed.  Cardiovascular:  Rate and Rhythm: Tachycardia present.  Pulmonary:     Effort: Pulmonary effort is normal.  Abdominal:     General: There is distension.     Tenderness: There is abdominal tenderness (Diffuse but worse across lower abdomen. ).  Genitourinary:    Comments: Foley in place. No blood around urethral meatus. No testicular pain. There is thin appear hematuria in the leg bag without the appearance of clots.  Musculoskeletal:        General: Normal range of motion.     Cervical back: Normal range of motion.  Skin:    General: Skin is warm and dry.  Neurological:     Mental Status: He is alert and oriented to person, place, and time.     ED Results / Procedures / Treatments   Labs (all labs ordered are listed, but  only abnormal results are displayed) Labs Reviewed  URINALYSIS, ROUTINE W REFLEX MICROSCOPIC - Abnormal; Notable for the following components:      Result Value   Color, Urine RED (*)    APPearance TURBID (*)    Glucose, UA   (*)    Value: TEST NOT REPORTED DUE TO COLOR INTERFERENCE OF URINE PIGMENT   Hgb urine dipstick   (*)    Value: TEST NOT REPORTED DUE TO COLOR INTERFERENCE OF URINE PIGMENT   Bilirubin Urine   (*)    Value: TEST NOT REPORTED DUE TO COLOR INTERFERENCE OF URINE PIGMENT   Ketones, ur   (*)    Value: TEST NOT REPORTED DUE TO COLOR INTERFERENCE OF URINE PIGMENT   Protein, ur   (*)    Value: TEST NOT REPORTED DUE TO COLOR INTERFERENCE OF URINE PIGMENT   Nitrite   (*)    Value: TEST NOT REPORTED DUE TO COLOR INTERFERENCE OF URINE PIGMENT   Leukocytes,Ua   (*)    Value: TEST NOT REPORTED DUE TO COLOR INTERFERENCE OF URINE PIGMENT   All other components within normal limits  CBC - Abnormal; Notable for the following components:   WBC 19.7 (*)    All other components within normal limits  BASIC METABOLIC PANEL - Abnormal; Notable for the following components:   Glucose, Bld 115 (*)    Creatinine, Ser 1.41 (*)    GFR calc non Af Amer 52 (*)    GFR calc Af Amer 60 (*)    All other components within normal limits  URINALYSIS, MICROSCOPIC (REFLEX) - Abnormal; Notable for the following components:   Bacteria, UA RARE (*)    All other components within normal limits  URINE CULTURE  LACTIC ACID, PLASMA  LACTIC ACID, PLASMA    EKG None  Radiology No results found.  Procedures Procedures (including critical care time)  Medications Ordered in ED Medications  HYDROmorphone (DILAUDID) injection 1 mg (has no administration in time range)  oxyCODONE-acetaminophen (PERCOCET/ROXICET) 5-325 MG per tablet 1 tablet (1 tablet Oral Given 06/13/19 0432)    ED Course  I have reviewed the triage vital signs and the nursing notes.  Pertinent labs & imaging results that were  available during my care of the patient were reviewed by me and considered in my medical decision making (see chart for details).    MDM Rules/Calculators/A&P                      Patient to ED with abdominal pain/distention, hematuria.   He is uncomfortable appearing but not ill/toxic. Abdomen is moderately distended.   Foley flushed  x 2 with some drainage but no symptomatic relief. Foley exchanged and is draining but still no symptomatic relief for the patient.   He has a leukocytosis of 20. Cr 141 without recent comparable. He is still significantly tender. Will perform CT scan for further evaluation.   Patient care signed out to [facility], PA-C, pending CT read and re-evaluation of urinary output. Bladder scan pending.  Urine culture sent.   Final Clinical Impression(s) / ED Diagnoses Final diagnoses:  None   1. Hematuria 2. Abdominal pain 3. Abdominal distention  Rx / DC Orders ED Discharge Orders    None       Charlann Lange, PA-C 06/13/19 [id number]    Ward, Delice Bison, DO 06/19/19 [phone]

## 2019-06-13 NOTE — ED Provider Notes (Signed)
Care assumed from [place], PA-C at shift change. See her note for full HPI.  In short, patient is a 69 year old male who presents to the ED due to abdominal distention and abdominal pain. He was seen by Dr. Junious Silk yesterday where a foley was placed due to urinary retention. Patient has a history of prostate enlargement with progressive difficulties urinating. No history of urinary obstruction. Prior to arrival, patient admits to hematuria and inability to empty his bladder. Denies fever, chills, nausea, vomiting. Denies testicular pain and edema.  Patient also admits to dysuria x1 week.  He was prescribed doxycycline yesterday at his urology appointment which he has not started yet.   Plan from previous provider: Pending CT abdomen. Will most likely need to call urology.  Physical Exam  BP (!) 138/98   Pulse 83   Temp 99.1 F (37.3 C) (Oral)   Resp (!) 23   SpO2 94%   Physical Exam Vitals and nursing note reviewed.  Constitutional:      General: He is not in acute distress.    Appearance: He is not toxic-appearing.  HENT:     Head: Normocephalic.  Eyes:     Pupils: Pupils are equal, round, and reactive to light.  Cardiovascular:     Rate and Rhythm: Normal rate and regular rhythm.     Pulses: Normal pulses.     Heart sounds: Normal heart sounds. No murmur. No friction rub. No gallop.   Pulmonary:     Effort: Pulmonary effort is normal.     Breath sounds: Normal breath sounds.  Abdominal:     General: Abdomen is flat. Bowel sounds are normal. There is distension.     Palpations: Abdomen is soft.     Tenderness: There is abdominal tenderness. There is no guarding or rebound.  Musculoskeletal:     Cervical back: Neck supple.     Comments: Able to move all 4 extremities without difficulty.   Skin:    General: Skin is warm and dry.  Neurological:     General: No focal deficit present.     Mental Status: He is alert.  Psychiatric:        Mood and Affect: Mood normal.        Behavior: Behavior normal.     ED Course/Procedures   Clinical Course as of Jun 12 1220  Fri Jun 13, 2019  0643 WBC(!): 19.7 [CA]  0921 Lactic Acid, Venous: 0.9 [CA]  1214 Spoke to Dr. Tresa Moore with urology who reviewed patient's images and chart. He recommends holding plavix if no stent placement within the past 6 months (patient had stent placement back in December), so will continue Plavix. He also advised patient to continue Finasteride and call the office for earlier appointment.    [CA]    Clinical Course User Index [CA] Suzy Bouchard, PA-C    Procedures  MDM  Care assumed from [place], [place] at shift change pending CT abdomen. See her note for full MDM.  Patient presents to ED due to abdominal distention/pain, hematuria, and urinary retention. He had a foley placed yesterday by Dr. Junious Silk. 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 No infectious symptoms.   CBC significant leukocytosis of 19.7. BMP reassuring with mild AKI with creatinine of 1.41. UA unable to result due to color interference. Urine culture pending.   CT scan personally reviewed which demonstrates: Hyperdense debris and blood products seen throughout the partially  decompressed bladder with diffuse bladder wall  thickening. This may  be due to cystitis, however cannot exclude underlying neoplasm.  Would recommend direct visualization when patient has a able to  tolerate.    Probable small amount of blood products/active bleed seen along the  penile portion of the urethra as described above.    Mild aortic Atherosclerosis (ICD10-I70.0).   Discussed case with Dr. Tresa Moore with urology.  See note above. Urine culture negative for any growth. Instructed patient to stop taking doxycycline given patient's urine culture is negative for any growth.  Suspect dysuria is related to BPH.  Patient unable to hold Plavix given he had stent placement within the past 6 months. Blood in foley bag  has lightened throughout his ED stay. No concern for urinary retention at this time. RN flushed foley numerous times while in the ED. Bladder scan demonstrated on 50cc urine.  Advised patient to call the urology office today or Monday to schedule an appointment for further evaluation. Strict ED precautions discussed with patient. Patient states understanding and agrees to plan. Patient discharged home in no acute distress and stable vitals.   Karie Kirks 06/13/19 1247    Blanchie Dessert, MD 06/14/19 2132

## 2019-06-19 DIAGNOSIS — R338 Other retention of urine: Secondary | ICD-10-CM | POA: Diagnosis not present

## 2019-06-19 DIAGNOSIS — R300 Dysuria: Secondary | ICD-10-CM | POA: Diagnosis not present

## 2019-06-19 DIAGNOSIS — N401 Enlarged prostate with lower urinary tract symptoms: Secondary | ICD-10-CM | POA: Diagnosis not present

## 2019-06-26 DIAGNOSIS — R3912 Poor urinary stream: Secondary | ICD-10-CM | POA: Diagnosis not present

## 2019-06-26 DIAGNOSIS — R300 Dysuria: Secondary | ICD-10-CM | POA: Diagnosis not present

## 2019-06-26 DIAGNOSIS — N401 Enlarged prostate with lower urinary tract symptoms: Secondary | ICD-10-CM | POA: Diagnosis not present

## 2019-06-26 DIAGNOSIS — R3914 Feeling of incomplete bladder emptying: Secondary | ICD-10-CM | POA: Diagnosis not present

## 2019-08-07 DIAGNOSIS — R3912 Poor urinary stream: Secondary | ICD-10-CM | POA: Diagnosis not present

## 2019-08-07 DIAGNOSIS — N401 Enlarged prostate with lower urinary tract symptoms: Secondary | ICD-10-CM | POA: Diagnosis not present

## 2019-08-07 DIAGNOSIS — R338 Other retention of urine: Secondary | ICD-10-CM | POA: Diagnosis not present

## 2019-09-04 DIAGNOSIS — N401 Enlarged prostate with lower urinary tract symptoms: Secondary | ICD-10-CM | POA: Diagnosis not present

## 2019-09-11 DIAGNOSIS — R079 Chest pain, unspecified: Secondary | ICD-10-CM | POA: Diagnosis not present

## 2019-09-11 DIAGNOSIS — I10 Essential (primary) hypertension: Secondary | ICD-10-CM | POA: Diagnosis not present

## 2019-09-11 DIAGNOSIS — I200 Unstable angina: Secondary | ICD-10-CM | POA: Diagnosis not present

## 2019-09-11 DIAGNOSIS — Z136 Encounter for screening for cardiovascular disorders: Secondary | ICD-10-CM | POA: Diagnosis not present

## 2019-09-30 DIAGNOSIS — N139 Obstructive and reflux uropathy, unspecified: Secondary | ICD-10-CM | POA: Diagnosis not present

## 2019-09-30 DIAGNOSIS — R3912 Poor urinary stream: Secondary | ICD-10-CM | POA: Diagnosis not present

## 2019-09-30 DIAGNOSIS — N401 Enlarged prostate with lower urinary tract symptoms: Secondary | ICD-10-CM | POA: Diagnosis not present

## 2019-10-22 DIAGNOSIS — R3914 Feeling of incomplete bladder emptying: Secondary | ICD-10-CM | POA: Diagnosis not present

## 2019-11-24 ENCOUNTER — Inpatient Hospital Stay (HOSPITAL_COMMUNITY)
Admission: EM | Admit: 2019-11-24 | Discharge: 2020-01-05 | DRG: 207 | Disposition: E | Payer: Medicare Other | Attending: Internal Medicine | Admitting: Internal Medicine

## 2019-11-24 ENCOUNTER — Emergency Department (HOSPITAL_COMMUNITY): Payer: Medicare Other

## 2019-11-24 ENCOUNTER — Encounter (HOSPITAL_COMMUNITY): Payer: Self-pay | Admitting: Emergency Medicine

## 2019-11-24 ENCOUNTER — Other Ambulatory Visit: Payer: Self-pay

## 2019-11-24 DIAGNOSIS — N170 Acute kidney failure with tubular necrosis: Secondary | ICD-10-CM | POA: Diagnosis not present

## 2019-11-24 DIAGNOSIS — E1165 Type 2 diabetes mellitus with hyperglycemia: Secondary | ICD-10-CM | POA: Diagnosis not present

## 2019-11-24 DIAGNOSIS — R972 Elevated prostate specific antigen [PSA]: Secondary | ICD-10-CM | POA: Diagnosis present

## 2019-11-24 DIAGNOSIS — Z7902 Long term (current) use of antithrombotics/antiplatelets: Secondary | ICD-10-CM | POA: Diagnosis not present

## 2019-11-24 DIAGNOSIS — R748 Abnormal levels of other serum enzymes: Secondary | ICD-10-CM | POA: Diagnosis not present

## 2019-11-24 DIAGNOSIS — Z01818 Encounter for other preprocedural examination: Secondary | ICD-10-CM

## 2019-11-24 DIAGNOSIS — E785 Hyperlipidemia, unspecified: Secondary | ICD-10-CM

## 2019-11-24 DIAGNOSIS — E8770 Fluid overload, unspecified: Secondary | ICD-10-CM | POA: Diagnosis not present

## 2019-11-24 DIAGNOSIS — Z515 Encounter for palliative care: Secondary | ICD-10-CM | POA: Diagnosis not present

## 2019-11-24 DIAGNOSIS — L8990 Pressure ulcer of unspecified site, unspecified stage: Secondary | ICD-10-CM | POA: Insufficient documentation

## 2019-11-24 DIAGNOSIS — A4189 Other specified sepsis: Secondary | ICD-10-CM | POA: Diagnosis not present

## 2019-11-24 DIAGNOSIS — Z9049 Acquired absence of other specified parts of digestive tract: Secondary | ICD-10-CM

## 2019-11-24 DIAGNOSIS — Y92239 Unspecified place in hospital as the place of occurrence of the external cause: Secondary | ICD-10-CM | POA: Diagnosis not present

## 2019-11-24 DIAGNOSIS — A419 Sepsis, unspecified organism: Secondary | ICD-10-CM | POA: Diagnosis not present

## 2019-11-24 DIAGNOSIS — Z83438 Family history of other disorder of lipoprotein metabolism and other lipidemia: Secondary | ICD-10-CM | POA: Diagnosis not present

## 2019-11-24 DIAGNOSIS — Z87891 Personal history of nicotine dependence: Secondary | ICD-10-CM | POA: Diagnosis not present

## 2019-11-24 DIAGNOSIS — E874 Mixed disorder of acid-base balance: Secondary | ICD-10-CM | POA: Diagnosis not present

## 2019-11-24 DIAGNOSIS — Z0189 Encounter for other specified special examinations: Secondary | ICD-10-CM

## 2019-11-24 DIAGNOSIS — R05 Cough: Secondary | ICD-10-CM | POA: Diagnosis not present

## 2019-11-24 DIAGNOSIS — Z978 Presence of other specified devices: Secondary | ICD-10-CM

## 2019-11-24 DIAGNOSIS — I10 Essential (primary) hypertension: Secondary | ICD-10-CM

## 2019-11-24 DIAGNOSIS — N182 Chronic kidney disease, stage 2 (mild): Secondary | ICD-10-CM | POA: Diagnosis present

## 2019-11-24 DIAGNOSIS — E875 Hyperkalemia: Secondary | ICD-10-CM | POA: Diagnosis not present

## 2019-11-24 DIAGNOSIS — E1122 Type 2 diabetes mellitus with diabetic chronic kidney disease: Secondary | ICD-10-CM | POA: Diagnosis present

## 2019-11-24 DIAGNOSIS — J189 Pneumonia, unspecified organism: Secondary | ICD-10-CM | POA: Diagnosis not present

## 2019-11-24 DIAGNOSIS — Z8249 Family history of ischemic heart disease and other diseases of the circulatory system: Secondary | ICD-10-CM | POA: Diagnosis not present

## 2019-11-24 DIAGNOSIS — R040 Epistaxis: Secondary | ICD-10-CM | POA: Diagnosis not present

## 2019-11-24 DIAGNOSIS — R0902 Hypoxemia: Secondary | ICD-10-CM

## 2019-11-24 DIAGNOSIS — Y95 Nosocomial condition: Secondary | ICD-10-CM | POA: Diagnosis not present

## 2019-11-24 DIAGNOSIS — K219 Gastro-esophageal reflux disease without esophagitis: Secondary | ICD-10-CM | POA: Diagnosis present

## 2019-11-24 DIAGNOSIS — E871 Hypo-osmolality and hyponatremia: Secondary | ICD-10-CM | POA: Diagnosis not present

## 2019-11-24 DIAGNOSIS — Z955 Presence of coronary angioplasty implant and graft: Secondary | ICD-10-CM

## 2019-11-24 DIAGNOSIS — N179 Acute kidney failure, unspecified: Secondary | ICD-10-CM

## 2019-11-24 DIAGNOSIS — R000 Tachycardia, unspecified: Secondary | ICD-10-CM | POA: Diagnosis not present

## 2019-11-24 DIAGNOSIS — E861 Hypovolemia: Secondary | ICD-10-CM | POA: Diagnosis present

## 2019-11-24 DIAGNOSIS — J1282 Pneumonia due to coronavirus disease 2019: Secondary | ICD-10-CM | POA: Diagnosis present

## 2019-11-24 DIAGNOSIS — J9811 Atelectasis: Secondary | ICD-10-CM | POA: Diagnosis not present

## 2019-11-24 DIAGNOSIS — Z79899 Other long term (current) drug therapy: Secondary | ICD-10-CM

## 2019-11-24 DIAGNOSIS — Z452 Encounter for adjustment and management of vascular access device: Secondary | ICD-10-CM | POA: Diagnosis not present

## 2019-11-24 DIAGNOSIS — I2510 Atherosclerotic heart disease of native coronary artery without angina pectoris: Secondary | ICD-10-CM | POA: Diagnosis present

## 2019-11-24 DIAGNOSIS — J9601 Acute respiratory failure with hypoxia: Secondary | ICD-10-CM

## 2019-11-24 DIAGNOSIS — U071 COVID-19: Principal | ICD-10-CM | POA: Diagnosis present

## 2019-11-24 DIAGNOSIS — D631 Anemia in chronic kidney disease: Secondary | ICD-10-CM | POA: Diagnosis not present

## 2019-11-24 DIAGNOSIS — R34 Anuria and oliguria: Secondary | ICD-10-CM | POA: Diagnosis not present

## 2019-11-24 DIAGNOSIS — D539 Nutritional anemia, unspecified: Secondary | ICD-10-CM | POA: Diagnosis not present

## 2019-11-24 DIAGNOSIS — R600 Localized edema: Secondary | ICD-10-CM | POA: Diagnosis not present

## 2019-11-24 DIAGNOSIS — Y738 Miscellaneous gastroenterology and urology devices associated with adverse incidents, not elsewhere classified: Secondary | ICD-10-CM | POA: Diagnosis not present

## 2019-11-24 DIAGNOSIS — J9600 Acute respiratory failure, unspecified whether with hypoxia or hypercapnia: Secondary | ICD-10-CM

## 2019-11-24 DIAGNOSIS — Z66 Do not resuscitate: Secondary | ICD-10-CM | POA: Diagnosis not present

## 2019-11-24 DIAGNOSIS — T859XXA Unspecified complication of internal prosthetic device, implant and graft, initial encounter: Secondary | ICD-10-CM | POA: Diagnosis not present

## 2019-11-24 DIAGNOSIS — G9341 Metabolic encephalopathy: Secondary | ICD-10-CM | POA: Diagnosis not present

## 2019-11-24 DIAGNOSIS — I959 Hypotension, unspecified: Secondary | ICD-10-CM | POA: Diagnosis not present

## 2019-11-24 DIAGNOSIS — E876 Hypokalemia: Secondary | ICD-10-CM | POA: Diagnosis not present

## 2019-11-24 DIAGNOSIS — I129 Hypertensive chronic kidney disease with stage 1 through stage 4 chronic kidney disease, or unspecified chronic kidney disease: Secondary | ICD-10-CM | POA: Diagnosis present

## 2019-11-24 DIAGNOSIS — Z880 Allergy status to penicillin: Secondary | ICD-10-CM

## 2019-11-24 DIAGNOSIS — Z7982 Long term (current) use of aspirin: Secondary | ICD-10-CM

## 2019-11-24 DIAGNOSIS — I25118 Atherosclerotic heart disease of native coronary artery with other forms of angina pectoris: Secondary | ICD-10-CM | POA: Diagnosis not present

## 2019-11-24 DIAGNOSIS — E878 Other disorders of electrolyte and fluid balance, not elsewhere classified: Secondary | ICD-10-CM | POA: Diagnosis not present

## 2019-11-24 DIAGNOSIS — R5383 Other fatigue: Secondary | ICD-10-CM | POA: Diagnosis not present

## 2019-11-24 DIAGNOSIS — Z4682 Encounter for fitting and adjustment of non-vascular catheter: Secondary | ICD-10-CM | POA: Diagnosis not present

## 2019-11-24 DIAGNOSIS — I361 Nonrheumatic tricuspid (valve) insufficiency: Secondary | ICD-10-CM | POA: Diagnosis not present

## 2019-11-24 DIAGNOSIS — T380X5A Adverse effect of glucocorticoids and synthetic analogues, initial encounter: Secondary | ICD-10-CM | POA: Diagnosis not present

## 2019-11-24 DIAGNOSIS — Z833 Family history of diabetes mellitus: Secondary | ICD-10-CM | POA: Diagnosis not present

## 2019-11-24 DIAGNOSIS — J9690 Respiratory failure, unspecified, unspecified whether with hypoxia or hypercapnia: Secondary | ICD-10-CM | POA: Diagnosis not present

## 2019-11-24 DIAGNOSIS — R0600 Dyspnea, unspecified: Secondary | ICD-10-CM | POA: Diagnosis not present

## 2019-11-24 DIAGNOSIS — J80 Acute respiratory distress syndrome: Secondary | ICD-10-CM | POA: Diagnosis not present

## 2019-11-24 DIAGNOSIS — I071 Rheumatic tricuspid insufficiency: Secondary | ICD-10-CM | POA: Diagnosis present

## 2019-11-24 DIAGNOSIS — E872 Acidosis: Secondary | ICD-10-CM | POA: Diagnosis not present

## 2019-11-24 DIAGNOSIS — R319 Hematuria, unspecified: Secondary | ICD-10-CM | POA: Diagnosis not present

## 2019-11-24 DIAGNOSIS — K1379 Other lesions of oral mucosa: Secondary | ICD-10-CM | POA: Diagnosis not present

## 2019-11-24 DIAGNOSIS — R7989 Other specified abnormal findings of blood chemistry: Secondary | ICD-10-CM | POA: Diagnosis not present

## 2019-11-24 DIAGNOSIS — R58 Hemorrhage, not elsewhere classified: Secondary | ICD-10-CM | POA: Diagnosis not present

## 2019-11-24 DIAGNOSIS — L89150 Pressure ulcer of sacral region, unstageable: Secondary | ICD-10-CM | POA: Diagnosis not present

## 2019-11-24 DIAGNOSIS — F419 Anxiety disorder, unspecified: Secondary | ICD-10-CM | POA: Diagnosis present

## 2019-11-24 DIAGNOSIS — R6521 Severe sepsis with septic shock: Secondary | ICD-10-CM | POA: Diagnosis not present

## 2019-11-24 DIAGNOSIS — J90 Pleural effusion, not elsewhere classified: Secondary | ICD-10-CM | POA: Diagnosis not present

## 2019-11-24 DIAGNOSIS — R0602 Shortness of breath: Secondary | ICD-10-CM | POA: Diagnosis not present

## 2019-11-24 HISTORY — DX: Atherosclerotic heart disease of native coronary artery without angina pectoris: I25.10

## 2019-11-24 LAB — CBC WITH DIFFERENTIAL/PLATELET
Abs Immature Granulocytes: 0.21 10*3/uL — ABNORMAL HIGH (ref 0.00–0.07)
Basophils Absolute: 0.1 10*3/uL (ref 0.0–0.1)
Basophils Relative: 1 %
Eosinophils Absolute: 0 10*3/uL (ref 0.0–0.5)
Eosinophils Relative: 0 %
HCT: 43 % (ref 39.0–52.0)
Hemoglobin: 14.6 g/dL (ref 13.0–17.0)
Immature Granulocytes: 2 %
Lymphocytes Relative: 9 %
Lymphs Abs: 0.9 10*3/uL (ref 0.7–4.0)
MCH: 31.1 pg (ref 26.0–34.0)
MCHC: 34 g/dL (ref 30.0–36.0)
MCV: 91.7 fL (ref 80.0–100.0)
Monocytes Absolute: 0.9 10*3/uL (ref 0.1–1.0)
Monocytes Relative: 9 %
Neutro Abs: 7.8 10*3/uL — ABNORMAL HIGH (ref 1.7–7.7)
Neutrophils Relative %: 79 %
Platelets: 196 10*3/uL (ref 150–400)
RBC: 4.69 MIL/uL (ref 4.22–5.81)
RDW: 12.7 % (ref 11.5–15.5)
WBC: 9.8 10*3/uL (ref 4.0–10.5)
nRBC: 0 % (ref 0.0–0.2)

## 2019-11-24 LAB — COMPREHENSIVE METABOLIC PANEL
ALT: 50 U/L — ABNORMAL HIGH (ref 0–44)
AST: 42 U/L — ABNORMAL HIGH (ref 15–41)
Albumin: 3.8 g/dL (ref 3.5–5.0)
Alkaline Phosphatase: 57 U/L (ref 38–126)
Anion gap: 9 (ref 5–15)
BUN: 35 mg/dL — ABNORMAL HIGH (ref 8–23)
CO2: 19 mmol/L — ABNORMAL LOW (ref 22–32)
Calcium: 8.1 mg/dL — ABNORMAL LOW (ref 8.9–10.3)
Chloride: 102 mmol/L (ref 98–111)
Creatinine, Ser: 1.49 mg/dL — ABNORMAL HIGH (ref 0.61–1.24)
GFR calc Af Amer: 55 mL/min — ABNORMAL LOW (ref >60)
GFR calc non Af Amer: 48 mL/min — ABNORMAL LOW (ref >60)
Glucose, Bld: 113 mg/dL — ABNORMAL HIGH (ref 70–99)
Potassium: 4.3 mmol/L (ref 3.5–5.1)
Sodium: 130 mmol/L — ABNORMAL LOW (ref 135–145)
Total Bilirubin: 1.1 mg/dL (ref 0.3–1.2)
Total Protein: 7.1 g/dL (ref 6.5–8.1)

## 2019-11-24 LAB — PROCALCITONIN: Procalcitonin: <0.1 ng/mL

## 2019-11-24 LAB — SARS CORONAVIRUS 2 BY RT PCR (HOSPITAL ORDER, PERFORMED IN ~~LOC~~ HOSPITAL LAB): SARS Coronavirus 2: POSITIVE — AB

## 2019-11-24 LAB — D-DIMER, QUANTITATIVE: D-Dimer, Quant: 1.67 ug/mL-FEU — ABNORMAL HIGH (ref 0.00–0.50)

## 2019-11-24 LAB — FERRITIN: Ferritin: 897 ng/mL — ABNORMAL HIGH (ref 24–336)

## 2019-11-24 LAB — LACTIC ACID, PLASMA
Lactic Acid, Venous: 1.4 mmol/L (ref 0.5–1.9)
Lactic Acid, Venous: 0.7 mmol/L (ref 0.5–1.9)

## 2019-11-24 LAB — C-REACTIVE PROTEIN: CRP: 6.3 mg/dL — ABNORMAL HIGH (ref <1.0)

## 2019-11-24 LAB — BRAIN NATRIURETIC PEPTIDE: B Natriuretic Peptide: 10.5 pg/mL (ref 0.0–100.0)

## 2019-11-24 LAB — LACTATE DEHYDROGENASE: LDH: 375 U/L — ABNORMAL HIGH (ref 98–192)

## 2019-11-24 LAB — HIV ANTIBODY (ROUTINE TESTING W REFLEX): HIV Screen 4th Generation wRfx: NONREACTIVE

## 2019-11-24 LAB — TRIGLYCERIDES: Triglycerides: 54 mg/dL (ref <150)

## 2019-11-24 LAB — TROPONIN I (HIGH SENSITIVITY)
Troponin I (High Sensitivity): 3 ng/L (ref <18)
Troponin I (High Sensitivity): 3 ng/L (ref <18)

## 2019-11-24 LAB — FIBRINOGEN: Fibrinogen: 691 mg/dL — ABNORMAL HIGH (ref 210–475)

## 2019-11-24 MED ORDER — SODIUM CHLORIDE 0.9 % IV SOLN
200.0000 mg | Freq: Once | INTRAVENOUS | Status: AC
  Administered 2019-11-24: 200 mg via INTRAVENOUS
  Filled 2019-11-24: qty 200

## 2019-11-24 MED ORDER — BARICITINIB 2 MG PO TABS
2.0000 mg | ORAL_TABLET | Freq: Every day | ORAL | Status: DC
  Administered 2019-11-24: 2 mg via ORAL
  Filled 2019-11-24 (×2): qty 1

## 2019-11-24 MED ORDER — ONDANSETRON HCL 4 MG/2ML IJ SOLN: 4.0000 mg | Freq: Four times a day (QID) | INTRAMUSCULAR | Status: DC | PRN

## 2019-11-24 MED ORDER — ATORVASTATIN CALCIUM 40 MG PO TABS
40.0000 mg | ORAL_TABLET | Freq: Every day | ORAL | Status: DC
  Administered 2019-11-24 – 2019-12-09 (×16): 40 mg via ORAL
  Filled 2019-11-24 (×16): qty 1

## 2019-11-24 MED ORDER — ASPIRIN EC 81 MG PO TBEC
81.0000 mg | DELAYED_RELEASE_TABLET | Freq: Every day | ORAL | Status: DC
  Administered 2019-11-25 – 2019-12-09 (×15): 81 mg via ORAL
  Filled 2019-11-24 (×16): qty 1

## 2019-11-24 MED ORDER — ACETAMINOPHEN 325 MG PO TABS
650.0000 mg | ORAL_TABLET | Freq: Four times a day (QID) | ORAL | Status: DC | PRN
  Administered 2019-11-26 – 2019-12-07 (×7): 650 mg via ORAL
  Filled 2019-11-24 (×7): qty 2

## 2019-11-24 MED ORDER — SODIUM CHLORIDE 0.9% FLUSH
3.0000 mL | Freq: Two times a day (BID) | INTRAVENOUS | Status: DC
  Administered 2019-11-24 – 2019-12-09 (×29): 3 mL via INTRAVENOUS

## 2019-11-24 MED ORDER — METHYLPREDNISOLONE SODIUM SUCC 125 MG IJ SOLR
0.5000 mg/kg | Freq: Two times a day (BID) | INTRAMUSCULAR | Status: DC
  Administered 2019-11-24 – 2019-11-26 (×4): 50 mg via INTRAVENOUS
  Filled 2019-11-24 (×4): qty 2

## 2019-11-24 MED ORDER — ALBUTEROL SULFATE HFA 108 (90 BASE) MCG/ACT IN AERS
2.0000 | INHALATION_SPRAY | Freq: Four times a day (QID) | RESPIRATORY_TRACT | Status: DC
  Administered 2019-11-24 – 2019-11-26 (×8): 2 via RESPIRATORY_TRACT
  Filled 2019-11-24: qty 6.7

## 2019-11-24 MED ORDER — PANTOPRAZOLE SODIUM 40 MG PO TBEC
40.0000 mg | DELAYED_RELEASE_TABLET | Freq: Every day | ORAL | Status: DC
  Administered 2019-11-24 – 2019-12-09 (×16): 40 mg via ORAL
  Filled 2019-11-24 (×16): qty 1

## 2019-11-24 MED ORDER — PREDNISONE 20 MG PO TABS: 50.0000 mg | ORAL_TABLET | Freq: Every day | ORAL | Status: DC

## 2019-11-24 MED ORDER — FINASTERIDE 5 MG PO TABS
5.0000 mg | ORAL_TABLET | Freq: Every day | ORAL | Status: DC
  Administered 2019-11-24 – 2019-12-09 (×16): 5 mg via ORAL
  Filled 2019-11-24 (×16): qty 1

## 2019-11-24 MED ORDER — CLOPIDOGREL BISULFATE 75 MG PO TABS
75.0000 mg | ORAL_TABLET | Freq: Every day | ORAL | Status: DC
  Administered 2019-11-24 – 2019-12-09 (×16): 75 mg via ORAL
  Filled 2019-11-24 (×16): qty 1

## 2019-11-24 MED ORDER — NITROGLYCERIN 0.4 MG SL SUBL: 0.4000 mg | SUBLINGUAL_TABLET | SUBLINGUAL | Status: DC | PRN

## 2019-11-24 MED ORDER — SODIUM CHLORIDE 0.9 % IV SOLN
100.0000 mg | Freq: Every day | INTRAVENOUS | Status: AC
  Administered 2019-11-25 – 2019-11-28 (×4): 100 mg via INTRAVENOUS
  Filled 2019-11-24 (×4): qty 20

## 2019-11-24 MED ORDER — ONDANSETRON HCL 4 MG PO TABS: 4.0000 mg | ORAL_TABLET | Freq: Four times a day (QID) | ORAL | Status: DC | PRN

## 2019-11-24 MED ORDER — METOPROLOL TARTRATE 5 MG/5ML IV SOLN
5.0000 mg | Freq: Two times a day (BID) | INTRAVENOUS | Status: DC
  Administered 2019-11-24 – 2019-11-30 (×13): 5 mg via INTRAVENOUS
  Filled 2019-11-24 (×13): qty 5

## 2019-11-24 MED ORDER — SODIUM CHLORIDE 0.9 % IV BOLUS
1000.0000 mL | Freq: Once | INTRAVENOUS | Status: AC
  Administered 2019-11-24: 1000 mL via INTRAVENOUS

## 2019-11-24 MED ORDER — ALFUZOSIN HCL ER 10 MG PO TB24
10.0000 mg | ORAL_TABLET | Freq: Every day | ORAL | Status: DC
  Administered 2019-11-25 – 2019-12-09 (×15): 10 mg via ORAL
  Filled 2019-11-24 (×15): qty 1

## 2019-11-24 MED ORDER — ENOXAPARIN SODIUM 40 MG/0.4ML ~~LOC~~ SOLN
40.0000 mg | SUBCUTANEOUS | Status: DC
  Administered 2019-11-24 – 2019-11-29 (×6): 40 mg via SUBCUTANEOUS
  Filled 2019-11-24 (×7): qty 0.4

## 2019-11-24 MED ORDER — GUAIFENESIN-DM 100-10 MG/5ML PO SYRP: 10.0000 mL | ORAL_SOLUTION | ORAL | Status: DC | PRN

## 2019-11-24 NOTE — Progress Notes (Signed)
Pt noted to have amber urine, I asked pt to increase his water intake. Also noted to have jittery/shakey  hands, I asked pt about any alcohol intake and he denied daily use. Will continue to monitor

## 2019-11-24 NOTE — ED Provider Notes (Signed)
[facility] DEPT Provider Note   CSN: [id number] Arrival date & time: 11/30/2019  1024     History Chief Complaint  Patient presents with  . Shortness of Breath    Ronald Olsen is a 69 y.o. male.  69 year old male with prior medical history as detailed below presents for evaluation of shortness of breath.  Patient reports approximately 10 days of malaise, fatigue, and cough.  Patient reports increasing shortness of breath over the last 72 hours.  Patient has not been vaccinated for Covid.  Patient has not been tested for Covid.  Patient reports multiple sick contacts in his family with similar symptoms.  No family numbers have been tested for Covid.  Patient reports that he contacted his primary care provider last week.  He was prescribed an antibiotic.  He cannot recall the name of this antibiotic.   EMS reports that patient had room air sats in the 70s on their initial eval.  He is currently on 10 L NRB and appears comfortable.  The history is provided by the patient, medical records and the EMS personnel.  Shortness of Breath Severity:  Moderate Onset quality:  Gradual Duration:  3 days Timing:  Constant Progression:  Worsening Chronicity:  New Context: activity   Relieved by:  Nothing Worsened by:  Nothing      Past Medical History:  Diagnosis Date  . Elevated PSA   . Family history of prostate problems   . Frequent headaches   . GERD (gastroesophageal reflux disease)   . Hyperlipidemia   . Hypertension   . Migraines     There are no problems to display for this patient.   Past Surgical History:  Procedure Laterality Date  . APPENDECTOMY    . CHOLECYSTECTOMY    . HERNIA REPAIR    . VASECTOMY         Family History  Problem Relation Age of Onset  . Hypertension Mother   . Cancer Father   . Diabetes Father   . Heart attack Father   . Heart disease Father   . Hyperlipidemia Father   . Hypertension Father   .  Hypertension Brother   . Hyperlipidemia Brother   . Heart disease Brother   . Hearing loss Brother   . Early death Maternal Grandfather   . Diabetes Paternal Grandmother   . Early death Paternal Grandfather   . Heart disease Paternal Grandfather     Social History   Tobacco Use  . Smoking status: Former Research scientist (life sciences)  . Smokeless tobacco: Former Network engineer Use Topics  . Alcohol use: Yes  . Drug use: Never    Home Medications Prior to Admission medications   Medication Sig Start Date End Date Taking? Authorizing Provider  alfuzosin (UROXATRAL) 10 MG 24 hr tablet Take 10 mg by mouth daily with breakfast.    [provider]  aspirin EC 81 MG tablet Take 81 mg by mouth daily.    [provider]  atorvastatin (LIPITOR) 40 MG tablet Take 40 mg by mouth daily. 04/28/19   [provider]  clopidogrel (PLAVIX) 75 MG tablet Take 75 mg by mouth daily. 04/28/19   [provider]  cyclobenzaprine (FLEXERIL) 10 MG tablet Take 10 mg by mouth 3 (three) times daily. 03/20/19   [provider]  finasteride (PROSCAR) 5 MG tablet Take 5 mg by mouth daily. 05/05/19   [provider]  HYDROcodone-acetaminophen (NORCO/VICODIN) 5-325 MG tablet Take 1 tablet by mouth every  6 (six) hours as needed for severe pain. 06/13/19   Suzy Bouchard, PA-C  lisinopril (ZESTRIL) 20 MG tablet Take 20 mg by mouth 2 (two) times daily. 06/01/19   [provider]  nitroGLYCERIN (NITROSTAT) 0.4 MG SL tablet Place 0.4 mg under the tongue every 5 (five) minutes as needed for chest pain.  12/10/18   [provider]  pantoprazole (PROTONIX) 40 MG tablet TAKE 1 TABLET BY MOUTH ONCE DAILY 08/26/18   Billie Ruddy, MD  Potassium 99 MG TABS Take 99 mg by mouth daily.    [provider]    Allergies    Amoxicillin  Review of Systems   Review of Systems  Respiratory: Positive for shortness of breath.   All other systems reviewed and are  negative.   Physical Exam Updated Vital Signs There were no vitals taken for this visit.  Physical Exam Vitals and nursing note reviewed.  Constitutional:      General: He is in acute distress.     Appearance: He is well-developed.  HENT:     Head: Normocephalic and atraumatic.  Eyes:     Conjunctiva/sclera: Conjunctivae normal.     Pupils: Pupils are equal, round, and reactive to light.  Cardiovascular:     Rate and Rhythm: Normal rate and regular rhythm.     Heart sounds: Normal heart sounds.  Pulmonary:     Effort: Pulmonary effort is normal. No respiratory distress.     Breath sounds: Normal breath sounds.  Abdominal:     General: There is no distension.     Palpations: Abdomen is soft.     Tenderness: There is no abdominal tenderness.  Musculoskeletal:        General: No deformity. Normal range of motion.     Cervical back: Normal range of motion and neck supple.  Skin:    General: Skin is warm and dry.  Neurological:     Mental Status: He is alert and oriented to person, place, and time.     ED Results / Procedures / Treatments   Labs (all labs ordered are listed, but only abnormal results are displayed) Labs Reviewed  SARS CORONAVIRUS 2 BY RT PCR ([place] [place] [facility]) - Abnormal; Notable for the following components:      Result Value   SARS Coronavirus 2 POSITIVE (*)    All other components within normal limits  CBC WITH DIFFERENTIAL/PLATELET - Abnormal; Notable for the following components:   Neutro Abs 7.8 (*)    Abs Immature Granulocytes 0.21 (*)    All other components within normal limits  COMPREHENSIVE METABOLIC PANEL - Abnormal; Notable for the following components:   Sodium 130 (*)    CO2 19 (*)    Glucose, Bld 113 (*)    BUN 35 (*)    Creatinine, Ser 1.49 (*)    Calcium 8.1 (*)    AST 42 (*)    ALT 50 (*)    GFR calc non Af Amer 48 (*)    GFR calc Af Amer 55 (*)    All other components within normal limits   D-DIMER, QUANTITATIVE (NOT AT [facility]) - Abnormal; Notable for the following components:   D-Dimer, Quant 1.67 (*)    All other components within normal limits  LACTATE DEHYDROGENASE - Abnormal; Notable for the following components:   LDH 375 (*)    All other components within normal limits  FIBRINOGEN - Abnormal; Notable for the following components:  Fibrinogen 691 (*)    All other components within normal limits  CULTURE, BLOOD (ROUTINE X 2)  CULTURE, BLOOD (ROUTINE X 2)  LACTIC ACID, PLASMA  PROCALCITONIN  BRAIN NATRIURETIC PEPTIDE  LACTIC ACID, PLASMA  FERRITIN  TRIGLYCERIDES  C-REACTIVE PROTEIN  TROPONIN I (HIGH SENSITIVITY)  TROPONIN I (HIGH SENSITIVITY)    EKG EKG Interpretation  Date/Time:  Monday November 24 2019 10:55:17 EDT Ventricular Rate:  109 PR Interval:    QRS Duration: 92 QT Interval:  307 QTC Calculation: 414 R Axis:   66 Text Interpretation: Age not entered, assumed to be  69 years old for purpose of ECG interpretation Sinus tachycardia Borderline T abnormalities, inferior leads Confirmed by Dene Gentry [phone]) on 11/12/2019 11:02:29 AM   Radiology DG Chest Port 1 View  Result Date: 11/18/2019 CLINICAL DATA:  Shortness of breath EXAM: PORTABLE CHEST 1 VIEW COMPARISON:  None. FINDINGS: There is ill-defined airspace opacity in the left mid lung and bibasilar regions. A more subtle degree of airspace opacity is noted in the right mid lung. Heart is upper normal in size with pulmonary vascularity normal. No adenopathy. No bone lesions. IMPRESSION: Areas of ill-defined opacity bilaterally, more on the left than on the right, likely due to multifocal pneumonia. Suspect atypical organism pneumonia; check of COVID-19 status advised. Heart upper normal in size.  No adenopathy evident. Electronically Signed   By: Lowella Grip III M.D.   On: 11/07/2019 11:55    Procedures Procedures (including critical care time)  Medications Ordered in ED Medications -  No data to display  ED Course  I have reviewed the triage vital signs and the nursing notes.  Pertinent labs & imaging results that were available during my care of the patient were reviewed by me and considered in my medical decision making (see chart for details).    MDM Rules/Calculators/A&P                          MDM  Screen complete  Ronald Olsen was evaluated in Emergency Department on 11/19/2019 for the symptoms described in the history of present illness. He was evaluated in the context of the global COVID-19 pandemic, which necessitated consideration that the patient might be at risk for infection with the SARS-CoV-2 virus that causes COVID-19. Institutional protocols and algorithms that pertain to the evaluation of patients at risk for COVID-19 are in a state of rapid change based on information released by regulatory bodies including the CDC and federal and state organizations. These policies and algorithms were followed during the patient's care in the ED.  Patient is presenting for evaluation of symptoms suggestive of COVID-19 pneumonia.  Covid testing is positive today.  Patient with O2 requirement.  At time of admission patient is on 6 L nasal cannula and appears to be comfortable with pulse ox at approximately 90%.  Hospitalist services aware of case and will evaluate for admission.   Final Clinical Impression(s) / ED Diagnoses Final diagnoses:  Hypoxia  COVID-19    Rx / DC Orders ED Discharge Orders    None       Valarie Merino, MD 12/01/2019 1244

## 2019-11-24 NOTE — ED Triage Notes (Addendum)
BIBA Per EMS: Pt coming from home. Malaise x1 week and SOBx2 days.  Other family members are also sick. All unvaccinated and have not been tested for COVID Pt was satting in the 70s upon EMS arrival. Pt on 10L nonrebreather satting in 90s now.  A&OX4 18 L AC  Vitals: 122/83 97.3  93% on 10L Nonrebreather  112 HR

## 2019-11-24 NOTE — ED Notes (Signed)
Unsuccessful attempt to draw 2nd set of blood cultures by this RN

## 2019-11-24 NOTE — Progress Notes (Signed)
Patient ambulated to the restroom without calling for assistance and without oxygen. Educated patient the importance of calling for assistance, urinal was at bedside. Bed alarm turned on. Patient placed back on non rebreather not maintaining 02 sats on  at 11-15  LPM o2 sats 85-89 %. Placed patient on high flow nasal cannula at 15 LPM o2 sats are maintaining between 90-93%. Attending MD Marva Panda aware of change in patient condition.

## 2019-11-24 NOTE — H&P (Addendum)
History and Physical        Hospital Admission Note Date: 11/13/2019  Patient name: Ronald Olsen Medical record number: [id number] Date of birth: 22-Aug-1950 Age: 69 y.o. Gender: male  PCP: Pcp, No  Patient coming from: Home Lives with: Family At baseline, ambulates: Independently  Chief Complaint    Chief Complaint  Patient presents with  . Shortness of Breath      HPI:   This is a 69 year old male with a past medical history of hypertension, hyperlipidemia who was not vaccinated against COVID-19 who presented to the ED for evaluation of shortness of breath, malaise, fatigue and cough x10 days and worsening over the past 72 hours.  He reported multiple sick contacts in his family with similar problems but no family members have been tested for Covid.  He became increasingly short of breath this morning prompting him to call EMS.  He was noted to have an SPO2 in the 70s upon EMS arrival and was placed on 10 L NRB.  He stated that his symptoms began about 10 days ago while working in the yard and increasingly became more fatigued with myalgias, cough and sore throat.  He contacted his PCP over the last weekend was prescribed Bactrim without any improvement.  He denies any fevers.  He has a very distant history of tobacco use, no history of COPD or asthma or blood clots.  Denies any leg swelling or tenderness.  ED Course: Afebrile, tachycardic, tachypneic requiring 6 L/min [place].  Upon my arrival I had to increase his supplemental O2 to 10 L at bedside and he was placed on 15 L with NRB once he arrived to the floor.  Notable labs: Sodium 130, CO2 19, glucose 113, BUN 35, creatinine 1.49 (at baseline), AST 42, ALT 50, BNP 10, LDH 375, troponin III, lactic acid 1.4, procalcitonin negative, D-dimer 1.67, fibrinogen 691, COVID-19 positive.  He was not given any medications in the  ED.  Vitals:   11/26/2019 1400 11/08/2019 1418  BP:  123/83  Pulse:  (!) 104  Resp:  (!) 22  Temp:  98.8 F (37.1 C)  SpO2: 92% 94%     Review of Systems:  Review of Systems  Constitutional: Positive for malaise/fatigue. Negative for fever.  HENT: Negative.   Respiratory: Positive for cough and shortness of breath. Negative for wheezing.   Cardiovascular: Negative for chest pain and palpitations.  Gastrointestinal: Negative for nausea and vomiting.  Genitourinary: Negative.   Musculoskeletal: Positive for back pain and myalgias.  Skin: Negative.   Neurological: Positive for weakness.  All other systems reviewed and are negative.   Medical/Social/Family History   Past Medical History: Past Medical History:  Diagnosis Date  . Elevated PSA   . Family history of prostate problems   . Frequent headaches   . GERD (gastroesophageal reflux disease)   . Hyperlipidemia   . Hypertension   . Migraines     Past Surgical History:  Procedure Laterality Date  . APPENDECTOMY    . CHOLECYSTECTOMY    . HERNIA REPAIR    . VASECTOMY      Medications: Prior to Admission medications   Medication Sig Start Date End Date Taking? Authorizing Provider  alfuzosin (UROXATRAL) 10 MG 24 hr tablet Take 10 mg by mouth daily with breakfast.   Yes [provider]  aspirin EC 81 MG tablet Take 81 mg by mouth daily.   Yes [provider]  atorvastatin (LIPITOR) 40 MG tablet Take 40 mg by mouth daily. 04/28/19  Yes [provider]  clopidogrel (PLAVIX) 75 MG tablet Take 75 mg by mouth daily. 04/28/19  Yes [provider]  cyclobenzaprine (FLEXERIL) 10 MG tablet Take 10 mg by mouth 3 (three) times daily. 03/20/19  Yes [provider]  finasteride (PROSCAR) 5 MG tablet Take 5 mg by mouth daily. 05/05/19  Yes [provider]  lisinopril (ZESTRIL) 20 MG tablet Take 20 mg by mouth 2 (two) times daily. 06/01/19  Yes [provider]  pantoprazole  (PROTONIX) 40 MG tablet TAKE 1 TABLET BY MOUTH ONCE DAILY Patient taking differently: Take 40 mg by mouth daily.  08/26/18  Yes Billie Ruddy, MD  sulfamethoxazole-trimethoprim (BACTRIM DS) 800-160 MG tablet Take 1 tablet by mouth 2 (two) times daily. Started 9.14.21 x10ds 11/18/19  Yes [provider]  nitroGLYCERIN (NITROSTAT) 0.4 MG SL tablet Place 0.4 mg under the tongue every 5 (five) minutes as needed for chest pain.  12/10/18   [provider]    Allergies:   Allergies  Allergen Reactions  . Amoxicillin Other (See Comments)    "Cloudy headed" with long doses    Social History:  reports that he has quit smoking. He has quit using smokeless tobacco. He reports current alcohol use. He reports that he does not use drugs.  Family History: Family History  Problem Relation Age of Onset  . Hypertension Mother   . Cancer Father   . Diabetes Father   . Heart attack Father   . Heart disease Father   . Hyperlipidemia Father   . Hypertension Father   . Hypertension Brother   . Hyperlipidemia Brother   . Heart disease Brother   . Hearing loss Brother   . Early death Maternal Grandfather   . Diabetes Paternal Grandmother   . Early death Paternal Grandfather   . Heart disease Paternal Grandfather      Objective   Physical Exam: Blood pressure 123/83, pulse (!) 104, temperature 98.8 F (37.1 C), resp. rate (!) 22, height 5\' 11"  (1.803 m), weight 99.8 kg, SpO2 94 %.  Physical Exam Vitals and nursing note reviewed.  Constitutional:      Appearance: Normal appearance.  HENT:     Head: Normocephalic and atraumatic.  Eyes:     Conjunctiva/sclera: Conjunctivae normal.  Cardiovascular:     Rate and Rhythm: Regular rhythm. Tachycardia present.  Pulmonary:     Effort: Tachypnea present. No accessory muscle usage.     Comments: O2 increased to 10 L patient remained at 89 to 90% Abdominal:     General: Abdomen is flat.     Palpations: Abdomen is soft.    Genitourinary:    Comments: dark urine Musculoskeletal:        General: No swelling or tenderness.     Right lower leg: No tenderness. No edema.     Left lower leg: No tenderness. No edema.  Skin:    Coloration: Skin is not jaundiced or pale.  Neurological:     Mental Status: He is alert. Mental status is at baseline.  Psychiatric:        Mood and Affect: Mood normal.        Behavior: Behavior normal.  LABS on Admission: I have personally reviewed all the labs and imaging below    Basic Metabolic Panel: Recent Labs  Lab 11/12/2019 1051  NA 130*  K 4.3  CL 102  CO2 19*  GLUCOSE 113*  BUN 35*  CREATININE 1.49*  CALCIUM 8.1*   Liver Function Tests: Recent Labs  Lab 12/01/2019 1051  AST 42*  ALT 50*  ALKPHOS 57  BILITOT 1.1  PROT 7.1  ALBUMIN 3.8   No results for input(s): LIPASE, AMYLASE in the last 168 hours. No results for input(s): AMMONIA in the last 168 hours. CBC: Recent Labs  Lab 11/25/2019 1051  WBC 9.8  NEUTROABS 7.8*  HGB 14.6  HCT 43.0  MCV 91.7  PLT 196   Cardiac Enzymes: No results for input(s): CKTOTAL, CKMB, CKMBINDEX, TROPONINI in the last 168 hours. BNP: Invalid input(s): POCBNP CBG: No results for input(s): GLUCAP in the last 168 hours.  Radiological Exams on Admission:  DG Chest Port 1 View  Result Date: 11/29/2019 CLINICAL DATA:  Shortness of breath EXAM: PORTABLE CHEST 1 VIEW COMPARISON:  None. FINDINGS: There is ill-defined airspace opacity in the left mid lung and bibasilar regions. A more subtle degree of airspace opacity is noted in the right mid lung. Heart is upper normal in size with pulmonary vascularity normal. No adenopathy. No bone lesions. IMPRESSION: Areas of ill-defined opacity bilaterally, more on the left than on the right, likely due to multifocal pneumonia. Suspect atypical organism pneumonia; check of COVID-19 status advised. Heart upper normal in size.  No adenopathy evident. Electronically Signed   By: Lowella Grip III M.D.   On: 11/28/2019 11:55      EKG: Independently reviewed.  At baseline   A & P   Principal Problem:   Acute hypoxemic respiratory failure due to COVID-19 [facility]) Active Problems:   CAD (coronary artery disease)   Essential hypertension   Hyponatremia   Hyperlipidemia   1. SIRS  Acute hypoxic respiratory failure secondary to COVID-19 a. SIRS criteria: Tachycardic, tachypneic O2 Requirements: 0 L/min at baseline, currently 15 L/min CRP 6.3, D Dimer 1.67, Procalcitonin negative - hold off antibiotics for now Remdesivir x 5 days, Steroids x 10 days, Start Baricitinib (risks and benefits of this medication were discussed with the patient and he agreed to start) Inhalers, antitussives Encourage Incentive Spirometry, Flutter Valve and Pronation as tolerated  2. Hypertension a. will hold lisinopril for now as he may be developing AKI with dark urine and slightly elevated creatinine b. will start on Lopressor 5 mg twice daily especially in the setting of CAD  3. Hyponatremia a. IV fluids  4. Hyperlipidemia a. Continue home statin  5. CAD s/p stent to LAD with stable angina (02/05/2019) a. Continue home aspirin, Plavix, statin b. Telemetry   DVT prophylaxis: Lovenox   Code Status: Full Code  Diet: Heart healthy/carb control Family Communication: Admission, patients condition and plan of care including tests being ordered have been discussed with the patient who indicates understanding and agrees with the plan and Code Status. Called patient's wife but went straight to voicemail Disposition Plan: The appropriate patient status for this patient is INPATIENT. Inpatient status is judged to be reasonable and necessary in order to provide the required intensity of service to ensure the patient's safety. The patient's presenting symptoms, physical exam findings, and initial radiographic and laboratory data in the context of their chronic comorbidities is felt to place  them at high risk for further clinical deterioration. Furthermore, it is  not anticipated that the patient will be medically stable for discharge from the hospital within 2 midnights of admission. The following factors support the patient status of inpatient.   " The patient's presenting symptoms include shortness of breath, myalgias and flulike symptoms. " The worrisome physical exam findings include hypoxia. " The initial radiographic and laboratory data are worrisome because of COVID-19 positive " The chronic co-morbidities include CAD, hypertension, hyperlipidemia.   * I certify that at the point of admission it is my clinical judgment that the patient will require inpatient hospital care spanning beyond 2 midnights from the point of admission due to high intensity of service, high risk for further deterioration and high frequency of surveillance required.*   Status is: Inpatient  Remains inpatient appropriate because:Unsafe d/c plan, IV treatments appropriate due to intensity of illness or inability to take PO and Inpatient level of care appropriate due to severity of illness   Dispo: The patient is from: Home              Anticipated d/c is to: Home              Anticipated d/c date is: > 3 days              Patient currently is not medically stable to d/c.        The medical decision making on this patient was of high complexity and the patient is at high risk for clinical deterioration, therefore this is a level 3  admission.  Consultants  . None  Procedures  . None  Time Spent on Admission: 70 minutes    Harold Hedge, DO [facility] Pager [phone] 11/19/2019, 3:03 PM

## 2019-11-25 LAB — C-REACTIVE PROTEIN: CRP: 7.1 mg/dL — ABNORMAL HIGH (ref <1.0)

## 2019-11-25 LAB — COMPREHENSIVE METABOLIC PANEL
ALT: 45 U/L — ABNORMAL HIGH (ref 0–44)
AST: 42 U/L — ABNORMAL HIGH (ref 15–41)
Albumin: 3.6 g/dL (ref 3.5–5.0)
Alkaline Phosphatase: 54 U/L (ref 38–126)
Anion gap: 9 (ref 5–15)
BUN: 33 mg/dL — ABNORMAL HIGH (ref 8–23)
CO2: 21 mmol/L — ABNORMAL LOW (ref 22–32)
Calcium: 8.4 mg/dL — ABNORMAL LOW (ref 8.9–10.3)
Chloride: 106 mmol/L (ref 98–111)
Creatinine, Ser: 1.24 mg/dL (ref 0.61–1.24)
GFR calc Af Amer: >60 mL/min (ref >60)
GFR calc non Af Amer: 60 mL/min — ABNORMAL LOW (ref >60)
Glucose, Bld: 125 mg/dL — ABNORMAL HIGH (ref 70–99)
Potassium: 4.9 mmol/L (ref 3.5–5.1)
Sodium: 136 mmol/L (ref 135–145)
Total Bilirubin: 1.1 mg/dL (ref 0.3–1.2)
Total Protein: 6.5 g/dL (ref 6.5–8.1)

## 2019-11-25 LAB — CBC WITH DIFFERENTIAL/PLATELET
Abs Immature Granulocytes: 0.44 10*3/uL — ABNORMAL HIGH (ref 0.00–0.07)
Basophils Absolute: 0.1 10*3/uL (ref 0.0–0.1)
Basophils Relative: 1 %
Eosinophils Absolute: 0 10*3/uL (ref 0.0–0.5)
Eosinophils Relative: 0 %
HCT: 40.5 % (ref 39.0–52.0)
Hemoglobin: 13.2 g/dL (ref 13.0–17.0)
Immature Granulocytes: 6 %
Lymphocytes Relative: 12 %
Lymphs Abs: 1 10*3/uL (ref 0.7–4.0)
MCH: 30.6 pg (ref 26.0–34.0)
MCHC: 32.6 g/dL (ref 30.0–36.0)
MCV: 94 fL (ref 80.0–100.0)
Monocytes Absolute: 0.8 10*3/uL (ref 0.1–1.0)
Monocytes Relative: 10 %
Neutro Abs: 5.7 10*3/uL (ref 1.7–7.7)
Neutrophils Relative %: 71 %
Platelets: 209 10*3/uL (ref 150–400)
RBC: 4.31 MIL/uL (ref 4.22–5.81)
RDW: 12.6 % (ref 11.5–15.5)
WBC: 8 10*3/uL (ref 4.0–10.5)
nRBC: 0 % (ref 0.0–0.2)

## 2019-11-25 LAB — GLUCOSE, CAPILLARY
Glucose-Capillary: 141 mg/dL — ABNORMAL HIGH (ref 70–99)
Glucose-Capillary: 131 mg/dL — ABNORMAL HIGH (ref 70–99)

## 2019-11-25 LAB — HEMOGLOBIN A1C
Hgb A1c MFr Bld: 5.9 % — ABNORMAL HIGH (ref 4.8–5.6)
Mean Plasma Glucose: 122.63 mg/dL

## 2019-11-25 LAB — D-DIMER, QUANTITATIVE: D-Dimer, Quant: 1.15 ug/mL-FEU — ABNORMAL HIGH (ref 0.00–0.50)

## 2019-11-25 LAB — FERRITIN: Ferritin: 882 ng/mL — ABNORMAL HIGH (ref 24–336)

## 2019-11-25 LAB — MAGNESIUM: Magnesium: 2.8 mg/dL — ABNORMAL HIGH (ref 1.7–2.4)

## 2019-11-25 MED ORDER — INSULIN ASPART 100 UNIT/ML ~~LOC~~ SOLN
0.0000 [IU] | Freq: Three times a day (TID) | SUBCUTANEOUS | Status: DC
  Administered 2019-11-25 – 2019-11-29 (×7): 1 [IU] via SUBCUTANEOUS
  Administered 2019-11-29 – 2019-11-30 (×3): 2 [IU] via SUBCUTANEOUS
  Administered 2019-11-30 – 2019-12-01 (×4): 1 [IU] via SUBCUTANEOUS
  Administered 2019-12-02: 3 [IU] via SUBCUTANEOUS
  Administered 2019-12-02: 2 [IU] via SUBCUTANEOUS
  Administered 2019-12-03: 1 [IU] via SUBCUTANEOUS
  Administered 2019-12-04 (×2): 2 [IU] via SUBCUTANEOUS
  Administered 2019-12-04: 3 [IU] via SUBCUTANEOUS
  Administered 2019-12-05: 1 [IU] via SUBCUTANEOUS
  Administered 2019-12-06: 2 [IU] via SUBCUTANEOUS
  Administered 2019-12-08: 1 [IU] via SUBCUTANEOUS
  Administered 2019-12-09: 2 [IU] via SUBCUTANEOUS

## 2019-11-25 MED ORDER — CYCLOBENZAPRINE HCL 5 MG PO TABS
5.0000 mg | ORAL_TABLET | Freq: Once | ORAL | Status: AC
  Administered 2019-11-25: 5 mg via ORAL
  Filled 2019-11-25: qty 1

## 2019-11-25 MED ORDER — INSULIN ASPART 100 UNIT/ML ~~LOC~~ SOLN: 0.0000 [IU] | Freq: Every day | SUBCUTANEOUS | Status: DC

## 2019-11-25 MED ORDER — CYCLOBENZAPRINE HCL 10 MG PO TABS
10.0000 mg | ORAL_TABLET | Freq: Two times a day (BID) | ORAL | Status: DC
  Administered 2019-11-25 – 2019-12-09 (×29): 10 mg via ORAL
  Filled 2019-11-25 (×29): qty 1

## 2019-11-25 MED ORDER — BARICITINIB 2 MG PO TABS
4.0000 mg | ORAL_TABLET | Freq: Every day | ORAL | Status: DC
  Administered 2019-11-25 – 2019-11-28 (×4): 4 mg via ORAL
  Filled 2019-11-25 (×3): qty 2

## 2019-11-25 NOTE — Progress Notes (Signed)
PROGRESS NOTE    Ronald Olsen  HRC:[id number] DOB: 05-12-1950 DOA: 11/28/2019 PCP: Merryl Hacker, No    Brief Narrative:  69 year old male with past history of hypertension, hyperlipidemia, coronary artery disease status post stent on December 2020 currently on dual antiplatelet therapy, not vaccinated against COVID-19 presented to the ER for evaluation of shortness of breath, malaise, fatigue and cough for 10 days and worsening over the last 72 hours.  EMS found him with 70% on room air and brought to the ER on nonrebreather. In the emergency room, afebrile.  Tachycardic.  Tachypneic requiring 6 L of oxygen.  Ultimately on increased oxygen demand.  COVID-19 positive.  Sodium 130, creatinine 1.49.  He was a started on 15 L nonrebreather and admitted to the hospital.  Chest x-ray with multifocal pneumonia.   Assessment & Plan:   Principal Problem:   Acute hypoxemic respiratory failure due to COVID-19 [facility]) Active Problems:   CAD (coronary artery disease)   Essential hypertension   Hyponatremia   Hyperlipidemia  Acute hypoxemic respiratory failure due to COVID-19 virus infection: Bilateral multifocal pneumonia. Continue to monitor due to significant symptoms  chest physiotherapy, incentive spirometry, deep breathing exercises, sputum induction, mucolytic's and bronchodilators. Supplemental oxygen to keep saturations more than 90%. Covid directed therapy with , steroids, on Solu-Medrol remdesivir, day 2/5 Baricitinib, day 2/14 or until hospital discharge.  Patient consented on admission. Due to severity of symptoms, patient will need daily inflammatory markers, chest x-rays, liver function test to monitor and direct COVID-19 therapies.  COVID-19 Labs  Recent Labs    11/06/2019 1050 11/07/2019 1051 11/25/19 0559  DDIMER  --  1.67* 1.15*  FERRITIN 897*  --  882*  LDH  --  375*  --   CRP 6.3*  --  7.1*    Lab Results  Component Value Date   SARSCOV2NAA POSITIVE (A) 11/14/2019    SpO2: 92 % O2 Flow Rate (L/min): 15 L/min  Hypertension: Presented with AKI and low blood pressures.  Responded to IV fluid.  Holding losartan.  Continue low pressure.  Hyponatremic hypovolemia: Treated with isotonic fluid overnight.  With improvement of sodium level and renal function.  Discontinue IV fluids and monitor.  Hyperlipidemia: On a statin that he will continue.  History of diabetes: Diet controlled.  A1c unknown.  Will check A1c.  Keep on sliding scale insulin coverage while on high-dose steroids.  Coronary artery disease status post stent with a stable angina, On dual antiplatelet therapy with aspirin and Plavix that he will continue.  On a statin. Not on beta-blockers, started in the hospital.   DVT prophylaxis: enoxaparin (LOVENOX) injection 40 mg Start: 11/27/2019 2200   Code Status: Full code Family Communication: wife called, will try again Disposition Plan: Status is: Inpatient  Remains inpatient appropriate because:Inpatient level of care appropriate due to severity of illness   Dispo: The patient is from: Home              Anticipated d/c is to: Home              Anticipated d/c date is: > 3 days              Patient currently is not medically stable to d/c.  Patient on very high flow oxygen and critically ill in the hospital.    Consultants:   None  Procedures:   None  Antimicrobials:  Antibiotics Given (last 72 hours)    Date/Time Action Medication Dose Rate   11/09/2019 1606 New  Bag/Given   remdesivir 200 mg in sodium chloride 0.9% 250 mL IVPB 200 mg 580 mL/hr   11/25/19 1012 New Bag/Given   remdesivir 100 mg in sodium chloride 0.9 % 100 mL IVPB 100 mg 200 mL/hr         Subjective: Patient seen and examined.  Overnight remained short of breath.  He feels about the same since yesterday.  Unable to take deep breaths.  Not mobilized yet.  Afebrile overnight.  Currently on 15 L nonrebreather.  Objective: Vitals:   11/25/19 0208 11/25/19  0610 11/25/19 0900 11/25/19 1329  BP: (!) 116/59   111/69  Pulse: 80   89  Resp: (!) 21   (!) 22  Temp: 98 F (36.7 C)   97.8 F (36.6 C)  TempSrc: Oral     SpO2: 92% 91% (!) 87% 92%  Weight:      Height:        Intake/Output Summary (Last 24 hours) at 11/25/2019 1355 Last data filed at 11/25/2019 1246 Gross per 24 hour  Intake 1958 ml  Output 1450 ml  Net 508 ml   Filed Weights   11/26/2019 1418  Weight: 99.8 kg    Examination:  General exam: Appears in mild respiratory distress on 15 L oxygen, anxious. Respiratory system: No added sounds.  Poor bilateral air entry. Cardiovascular system: S1 & S2 heard, RRR. No JVD, murmurs, rubs, gallops or clicks. No pedal edema. Gastrointestinal system: Abdomen is nondistended, soft and nontender. No organomegaly or masses felt. Normal bowel sounds heard. Central nervous system: Alert and oriented. No focal neurological deficits. Extremities: Symmetric 5 x 5 power. Skin: No rashes, lesions or ulcers Psychiatry: Judgement and insight appear normal. Mood & affect anxious.    Data Reviewed: I have personally reviewed following labs and imaging studies  CBC: Recent Labs  Lab 11/06/2019 1051 11/25/19 0559  WBC 9.8 8.0  NEUTROABS 7.8* 5.7  HGB 14.6 13.2  HCT 43.0 40.5  MCV 91.7 94.0  PLT 196 952   Basic Metabolic Panel: Recent Labs  Lab 12/01/2019 1051 11/25/19 0559  NA 130* 136  K 4.3 4.9  CL 102 106  CO2 19* 21*  GLUCOSE 113* 125*  BUN 35* 33*  CREATININE 1.49* 1.24  CALCIUM 8.1* 8.4*  MG  --  2.8*   GFR: Estimated Creatinine Clearance: 69.6 mL/min (by C-G formula based on SCr of 1.24 mg/dL). Liver Function Tests: Recent Labs  Lab 12/02/2019 1051 11/25/19 0559  AST 42* 42*  ALT 50* 45*  ALKPHOS 57 54  BILITOT 1.1 1.1  PROT 7.1 6.5  ALBUMIN 3.8 3.6   No results for input(s): LIPASE, AMYLASE in the last 168 hours. No results for input(s): AMMONIA in the last 168 hours. Coagulation Profile: No results for  input(s): INR, PROTIME in the last 168 hours. Cardiac Enzymes: No results for input(s): CKTOTAL, CKMB, CKMBINDEX, TROPONINI in the last 168 hours. BNP (last 3 results) No results for input(s): PROBNP in the last 8760 hours. HbA1C: No results for input(s): HGBA1C in the last 72 hours. CBG: No results for input(s): GLUCAP in the last 168 hours. Lipid Profile: Recent Labs    11/21/2019 1310  TRIG 54   Thyroid Function Tests: No results for input(s): TSH, T4TOTAL, FREET4, T3FREE, THYROIDAB in the last 72 hours. Anemia Panel: Recent Labs    11/29/2019 1050 11/25/19 0559  FERRITIN 897* 882*   Sepsis Labs: Recent Labs  Lab 11/15/2019 1051 11/20/2019 1310  PROCALCITON <0.10  --  LATICACIDVEN 1.4 0.7    Recent Results (from the past 240 hour(s))  SARS Coronavirus 2 by RT PCR (hospital order, performed in [facility] hospital lab) Nasopharyngeal Nasopharyngeal Swab     Status: Abnormal   Collection Time: 11/29/2019 10:51 AM   Specimen: Nasopharyngeal Swab  Result Value Ref Range Status   SARS Coronavirus 2 POSITIVE (A) NEGATIVE Final    Comment: RESULT CALLED TO, READ BACK BY AND VERIFIED WITH: WEST,S. RN @1234  ON 9.20.2021 BY NMCCOY (NOTE) SARS-CoV-2 target nucleic acids are DETECTED  SARS-CoV-2 RNA is generally detectable in upper respiratory specimens  during the acute phase of infection.  Positive results are indicative  of the presence of the identified virus, but do not rule out bacterial infection or co-infection with other pathogens not detected by the test.  Clinical correlation with patient history and  other diagnostic information is necessary to determine patient infection status.  The expected result is negative.  Fact Sheet for Patients:   StrictlyIdeas.no   Fact Sheet for Healthcare Providers:   BankingDealers.co.za    This test is not yet approved or cleared by the [place] FDA and  has been authorized for  detection and/or diagnosis of SARS-CoV-2 by FDA under an Emergency Use Authorization (EUA).  This EUA will remain in effect (meaning th is test can be used) for the duration of  the COVID-19 declaration under Section 564(b)(1) of the Act, 21 U.S.C. section 360-bbb-3(b)(1), unless the authorization is terminated or revoked sooner.  Performed at [facility], [place] [address], [place], [place] [address]   Blood Culture (routine x 2)     Status: None (Preliminary result)   Collection Time: 11/13/2019 10:51 AM   Specimen: BLOOD RIGHT HAND  Result Value Ref Range Status   Specimen Description   Final    BLOOD RIGHT HAND Performed at [facility], [place] [address], [place], [place] [address]    Special Requests   Final    BOTTLES DRAWN AEROBIC AND ANAEROBIC Blood Culture adequate volume Performed at [place] [address], [place], [place] [address]    Culture   Final    NO GROWTH < 24 HOURS Performed at [facility] [address] [place], [place] [address]    Report Status PENDING  Incomplete  Blood Culture (routine x 2)     Status: None (Preliminary result)   Collection Time: 11/06/2019  1:10 PM   Specimen: BLOOD  Result Value Ref Range Status   Specimen Description   Final    BLOOD LEFT WRIST Performed at [place] [address], [place], [place] [address]    Special Requests   Final    BOTTLES DRAWN AEROBIC AND ANAEROBIC Blood Culture adequate volume Performed at [place] [address], [place], [place] [address]    Culture   Final    NO GROWTH < 24 HOURS Performed at [place] [address], [place],  [address]    Report Status PENDING  Incomplete         Radiology Studies: DG Chest Port 1 View  Result Date: 12/04/2019 CLINICAL DATA:  Shortness of breath EXAM: PORTABLE CHEST 1 VIEW COMPARISON:  None. FINDINGS: There is ill-defined airspace opacity  in the left mid lung and bibasilar regions. A more subtle degree of airspace opacity is noted in the right mid lung. Heart is upper normal in size with pulmonary vascularity normal. No adenopathy.  No bone lesions. IMPRESSION: Areas of ill-defined opacity bilaterally, more on the left than on the right, likely due to multifocal pneumonia. Suspect atypical organism pneumonia; check of COVID-19 status advised. Heart upper normal in size.  No adenopathy evident. Electronically Signed   By: Lowella Grip III M.D.   On: 11/20/2019 11:55        Scheduled Meds: . albuterol  2 puff Inhalation Q6H  . alfuzosin  10 mg Oral Q breakfast  . aspirin EC  81 mg Oral Daily  . atorvastatin  40 mg Oral Daily  . baricitinib  4 mg Oral Daily  . clopidogrel  75 mg Oral Daily  . cyclobenzaprine  10 mg Oral BID  . enoxaparin (LOVENOX) injection  40 mg Subcutaneous Q24H  . finasteride  5 mg Oral Daily  . insulin aspart  0-5 Units Subcutaneous QHS  . insulin aspart  0-9 Units Subcutaneous TID WC  . methylPREDNISolone (SOLU-MEDROL) injection  0.5 mg/kg Intravenous Q12H   Followed by  . [START ON 11/27/2019] predniSONE  50 mg Oral Daily  . metoprolol tartrate  5 mg Intravenous Q12H  . pantoprazole  40 mg Oral Daily  . sodium chloride flush  3 mL Intravenous Q12H   Continuous Infusions: . remdesivir 100 mg in NS 100 mL 100 mg (11/25/19 1012)     LOS: 1 day    Time spent: 35 minutes    Barb Merino, MD [facility] Pager [phone]

## 2019-11-25 NOTE — Progress Notes (Signed)
0600 lab tech called me to the room as she noted pt to have jerky / twitching movements of his arms & legs while laying on his left side. Pt responds to verba stimuli, when I held his hand there was not jerking, he denies any  Unusual sensations , is A&Ox4 but slow to respond. O2 sats on 15L HFNC are 86-88%, pt is noted to be belly breathing. Encouraged slow deep breathing in thru nose out thru mouth. Changed back to the NRB 15lnc

## 2019-11-26 LAB — CBC WITH DIFFERENTIAL/PLATELET
Abs Immature Granulocytes: 0.63 10*3/uL — ABNORMAL HIGH (ref 0.00–0.07)
Basophils Absolute: 0.1 10*3/uL (ref 0.0–0.1)
Basophils Relative: 1 %
Eosinophils Absolute: 0 10*3/uL (ref 0.0–0.5)
Eosinophils Relative: 0 %
HCT: 40 % (ref 39.0–52.0)
Hemoglobin: 13.4 g/dL (ref 13.0–17.0)
Immature Granulocytes: 5 %
Lymphocytes Relative: 10 %
Lymphs Abs: 1.3 10*3/uL (ref 0.7–4.0)
MCH: 30.9 pg (ref 26.0–34.0)
MCHC: 33.5 g/dL (ref 30.0–36.0)
MCV: 92.2 fL (ref 80.0–100.0)
Monocytes Absolute: 1 10*3/uL (ref 0.1–1.0)
Monocytes Relative: 7 %
Neutro Abs: 10.6 10*3/uL — ABNORMAL HIGH (ref 1.7–7.7)
Neutrophils Relative %: 77 %
Platelets: 238 10*3/uL (ref 150–400)
RBC: 4.34 MIL/uL (ref 4.22–5.81)
RDW: 12.7 % (ref 11.5–15.5)
WBC: 13.6 10*3/uL — ABNORMAL HIGH (ref 4.0–10.5)
nRBC: 0 % (ref 0.0–0.2)

## 2019-11-26 LAB — COMPREHENSIVE METABOLIC PANEL
ALT: 45 U/L — ABNORMAL HIGH (ref 0–44)
AST: 44 U/L — ABNORMAL HIGH (ref 15–41)
Albumin: 3.3 g/dL — ABNORMAL LOW (ref 3.5–5.0)
Alkaline Phosphatase: 52 U/L (ref 38–126)
Anion gap: 10 (ref 5–15)
BUN: 42 mg/dL — ABNORMAL HIGH (ref 8–23)
CO2: 16 mmol/L — ABNORMAL LOW (ref 22–32)
Calcium: 8.2 mg/dL — ABNORMAL LOW (ref 8.9–10.3)
Chloride: 106 mmol/L (ref 98–111)
Creatinine, Ser: 1.25 mg/dL — ABNORMAL HIGH (ref 0.61–1.24)
GFR calc Af Amer: >60 mL/min (ref >60)
GFR calc non Af Amer: 59 mL/min — ABNORMAL LOW (ref >60)
Glucose, Bld: 123 mg/dL — ABNORMAL HIGH (ref 70–99)
Potassium: 4.5 mmol/L (ref 3.5–5.1)
Sodium: 132 mmol/L — ABNORMAL LOW (ref 135–145)
Total Bilirubin: 0.8 mg/dL (ref 0.3–1.2)
Total Protein: 6.3 g/dL — ABNORMAL LOW (ref 6.5–8.1)

## 2019-11-26 LAB — C-REACTIVE PROTEIN: CRP: 3.4 mg/dL — ABNORMAL HIGH (ref <1.0)

## 2019-11-26 LAB — MAGNESIUM: Magnesium: 2.8 mg/dL — ABNORMAL HIGH (ref 1.7–2.4)

## 2019-11-26 LAB — GLUCOSE, CAPILLARY
Glucose-Capillary: 110 mg/dL — ABNORMAL HIGH (ref 70–99)
Glucose-Capillary: 137 mg/dL — ABNORMAL HIGH (ref 70–99)
Glucose-Capillary: 134 mg/dL — ABNORMAL HIGH (ref 70–99)
Glucose-Capillary: 146 mg/dL — ABNORMAL HIGH (ref 70–99)

## 2019-11-26 LAB — FERRITIN: Ferritin: 991 ng/mL — ABNORMAL HIGH (ref 24–336)

## 2019-11-26 LAB — D-DIMER, QUANTITATIVE: D-Dimer, Quant: 0.66 ug/mL-FEU — ABNORMAL HIGH (ref 0.00–0.50)

## 2019-11-26 MED ORDER — ALBUTEROL SULFATE HFA 108 (90 BASE) MCG/ACT IN AERS
2.0000 | INHALATION_SPRAY | RESPIRATORY_TRACT | Status: DC | PRN
  Administered 2019-12-06: 2 via RESPIRATORY_TRACT

## 2019-11-26 MED ORDER — HYDROCOD POLST-CPM POLST ER 10-8 MG/5ML PO SUER
5.0000 mL | Freq: Two times a day (BID) | ORAL | Status: DC
  Administered 2019-11-26 – 2019-12-09 (×27): 5 mL via ORAL
  Filled 2019-11-26 (×27): qty 5

## 2019-11-26 MED ORDER — ALBUTEROL SULFATE HFA 108 (90 BASE) MCG/ACT IN AERS: 2.0000 | INHALATION_SPRAY | Freq: Four times a day (QID) | RESPIRATORY_TRACT | Status: DC

## 2019-11-26 MED ORDER — IPRATROPIUM-ALBUTEROL 20-100 MCG/ACT IN AERS
1.0000 | INHALATION_SPRAY | Freq: Four times a day (QID) | RESPIRATORY_TRACT | Status: DC
  Administered 2019-11-26 – 2019-11-30 (×14): 1 via RESPIRATORY_TRACT
  Filled 2019-11-26: qty 4

## 2019-11-26 MED ORDER — METHYLPREDNISOLONE SODIUM SUCC 125 MG IJ SOLR
60.0000 mg | Freq: Two times a day (BID) | INTRAMUSCULAR | Status: DC
  Administered 2019-11-26 – 2019-11-29 (×6): 60 mg via INTRAVENOUS
  Filled 2019-11-26 (×6): qty 2

## 2019-11-26 NOTE — Progress Notes (Signed)
Pt stated that he did not want the nonrebreather on tonight and want to continue to use HFNC instead. Pt is at 88-93% at 15L. Will continue to monitor the patient.

## 2019-11-26 NOTE — Progress Notes (Addendum)
PROGRESS NOTE    Ronald Olsen  TMH:[id number] DOB: [birth year] DOA: 11/22/2019 PCP: Pcp, No    Brief Narrative:  Patient admitted to the hospital with working diagnosis of acute hypoxic respiratory failure due to SARS COVID-19 viral pneumonia.  69 year old male with a past medical history for hypertension, dyslipidemia who presents with dyspnea, malaise, fatigue and cough for about 10 days.  Acute worsening of his symptoms over the last 72 hours prior to hospitalization.  Multiple sick contacts at home, he has not been vaccinated.  Due to severe symptoms he called EMS, his oxygen saturation was in the 70s and he was placed on a 10 L nonrebreather mask and transported to the hospital.  On his initial physical examination he was afebrile, but tachycardic and tachypneic, requiring 6 L/min of supplemental oxygen per nasal cannula.  His lungs had no wheezing but positive tachypnea, heart S1-S2, present, tachycardic, abdomen soft, no lower extremity edema.  Chest radiograph with bilateral infiltrates at the lower lobes, peripherally, more left than right.   Assessment & Plan:   Principal Problem:   Acute hypoxemic respiratory failure due to COVID-19 [facility]) Active Problems:   CAD (coronary artery disease)   Essential hypertension   Hyponatremia   Hyperlipidemia   1.  Acute hypoxic respiratory failure due to SARS COVID-19 viral pneumonia.  RR: 29  Pulse oxymetry:87% Fi02: 15L/min   COVID-19 Labs  Recent Labs    11/12/2019 1050 11/08/2019 1051 11/25/19 0559 11/26/19 0406  DDIMER  --  1.67* 1.15* 0.66*  FERRITIN 897*  --  882* 991*  LDH  --  375*  --   --   CRP 6.3*  --  7.1* 3.4*    Lab Results  Component Value Date   SARSCOV2NAA POSITIVE (A) 11/16/2019   Patient continue to have high oxygen requirements and oxygen desaturation on ambulation.  Inflammatory markers trending down except for ferritin.   Continue medical therapy with remdesivir #3/5, (AST 44 and ALT 45)  On  baricitinib, and systemic steroids. Increase dose of methylprednisolone to 60 mg IV q12. He has been on 15 L per HFNC for the last 48H. Continue with airway clearing techniques with incentive spirometer and flutter valve, antitussive agents and bronchodilators ( change albuterol to PRN and add scheduled combivent).    Out of bed to chair tid with meals, PT, OT and encourage prone for 4 H at the time for 16 H per day.  Follow on inflammatory markers.   2. HTN/ CAD. Blood pressure has been well controlled, 108/77 mmHg. Continue to hold on antihypertensive medications. Patient is chest pain free, continue antiplatelet therapy with asa and clopidogrel.   3. AKI on CKD stage 2/ Hyponatremia/ non anion gap metabolic acidosis. Base cr at 1,2. His renal function today with stable cr at 1,25 with K at 4,5 and plasma bicarbonate at 16. NA 132. Hold on IV fluids for now, patient is tolerating po well, follow up on renal function in am.  4. Controlled T2DM/ dyslipidemia.  Fasting glucose is 123, continue insulin sliding scale for glucose cover and monitoring, patient is tolerating po well.   Continue with atorvastatin.   Patient continue to be at high risk for worsening respiratory failure  Status is: Inpatient  Remains inpatient appropriate because:IV treatments appropriate due to intensity of illness or inability to take PO   Dispo:  Patient From: Home  Planned Disposition: Home with Health Care Svc  Expected discharge date: 11/28/19  Medically stable for discharge: No  DVT prophylaxis: Enoxaparin   Code Status:   full  Family Communication:  No family at the bedside      Subjective: Patient with persistent dyspnea, worse with exertion, no nausea or vomiting, and tolerating po well, this am is out of bed to chair. Positive desaturation on ambulation.   Objective: Vitals:   11/25/19 1329 11/25/19 2018 11/26/19 0327 11/26/19 0800  BP: 111/69 130/82 108/77   Pulse: 89 91 77   Resp: (!)  22 15 15    Temp: 97.8 F (36.6 C) (!) 97.5 F (36.4 C) 97.9 F (36.6 C)   TempSrc: Oral Oral Oral   SpO2: 92% 97% 91% (!) 87%  Weight:      Height:        Intake/Output Summary (Last 24 hours) at 11/26/2019 1052 Last data filed at 11/26/2019 0849 Gross per 24 hour  Intake 690 ml  Output 700 ml  Net -10 ml   Filed Weights   11/07/2019 1418  Weight: 99.8 kg    Examination:   General: deconditioned and ill looking appearing  Neurology: Awake and alert, non focal  E ENT: no pallor, no icterus, oral mucosa moist Cardiovascular: No JVD. S1-S2 present, rhythmic, no gallops, rubs, or murmurs. No lower extremity edema. Pulmonary: positive breath sounds bilaterally,  no wheezing.  Gastrointestinal. Abdomen soft and non tender Skin. No rashes Musculoskeletal: no joint deformities     Data Reviewed: I have personally reviewed following labs and imaging studies  CBC: Recent Labs  Lab 11/16/2019 1051 11/25/19 0559 11/26/19 0406  WBC 9.8 8.0 13.6*  NEUTROABS 7.8* 5.7 10.6*  HGB 14.6 13.2 13.4  HCT 43.0 40.5 40.0  MCV 91.7 94.0 92.2  PLT 196 209 093   Basic Metabolic Panel: Recent Labs  Lab 12/03/2019 1051 11/25/19 0559 11/26/19 0406  NA 130* 136 132*  K 4.3 4.9 4.5  CL 102 106 106  CO2 19* 21* 16*  GLUCOSE 113* 125* 123*  BUN 35* 33* 42*  CREATININE 1.49* 1.24 1.25*  CALCIUM 8.1* 8.4* 8.2*  MG  --  2.8* 2.8*   GFR: Estimated Creatinine Clearance: 69 mL/min (A) (by C-G formula based on SCr of 1.25 mg/dL (H)). Liver Function Tests: Recent Labs  Lab 11/19/2019 1051 11/25/19 0559 11/26/19 0406  AST 42* 42* 44*  ALT 50* 45* 45*  ALKPHOS 57 54 52  BILITOT 1.1 1.1 0.8  PROT 7.1 6.5 6.3*  ALBUMIN 3.8 3.6 3.3*   No results for input(s): LIPASE, AMYLASE in the last 168 hours. No results for input(s): AMMONIA in the last 168 hours. Coagulation Profile: No results for input(s): INR, PROTIME in the last 168 hours. Cardiac Enzymes: No results for input(s): CKTOTAL,  CKMB, CKMBINDEX, TROPONINI in the last 168 hours. BNP (last 3 results) No results for input(s): PROBNP in the last 8760 hours. HbA1C: Recent Labs    11/25/19 0559  HGBA1C 5.9*   CBG: Recent Labs  Lab 11/25/19 1620 11/25/19 2114 11/26/19 0803  GLUCAP 141* 131* 110*   Lipid Profile: Recent Labs    11/27/2019 1310  TRIG 54   Thyroid Function Tests: No results for input(s): TSH, T4TOTAL, FREET4, T3FREE, THYROIDAB in the last 72 hours. Anemia Panel: Recent Labs    11/25/19 0559 11/26/19 0406  FERRITIN 882* 991*      Radiology Studies: I have reviewed all of the imaging during this hospital visit personally     Scheduled Meds: . albuterol  2 puff Inhalation Q6H  . alfuzosin  10 mg  Oral Q breakfast  . aspirin EC  81 mg Oral Daily  . atorvastatin  40 mg Oral Daily  . baricitinib  4 mg Oral Daily  . clopidogrel  75 mg Oral Daily  . cyclobenzaprine  10 mg Oral BID  . enoxaparin (LOVENOX) injection  40 mg Subcutaneous Q24H  . finasteride  5 mg Oral Daily  . insulin aspart  0-5 Units Subcutaneous QHS  . insulin aspart  0-9 Units Subcutaneous TID WC  . methylPREDNISolone (SOLU-MEDROL) injection  0.5 mg/kg Intravenous Q12H   Followed by  . [START ON 11/27/2019] predniSONE  50 mg Oral Daily  . metoprolol tartrate  5 mg Intravenous Q12H  . pantoprazole  40 mg Oral Daily  . sodium chloride flush  3 mL Intravenous Q12H   Continuous Infusions: . remdesivir 100 mg in NS 100 mL 100 mg (11/26/19 1038)     LOS: 2 days        Cellie Dardis Gerome Apley, MD

## 2019-11-26 NOTE — Progress Notes (Signed)
Pt is at 88-93% using HFNC at 15L. Pt deny SOB. Will continue to monitor the patient.

## 2019-11-27 ENCOUNTER — Inpatient Hospital Stay (HOSPITAL_COMMUNITY): Payer: Medicare Other

## 2019-11-27 LAB — COMPREHENSIVE METABOLIC PANEL
ALT: 45 U/L — ABNORMAL HIGH (ref 0–44)
AST: 47 U/L — ABNORMAL HIGH (ref 15–41)
Albumin: 3.3 g/dL — ABNORMAL LOW (ref 3.5–5.0)
Alkaline Phosphatase: 59 U/L (ref 38–126)
Anion gap: 10 (ref 5–15)
BUN: 44 mg/dL — ABNORMAL HIGH (ref 8–23)
CO2: 19 mmol/L — ABNORMAL LOW (ref 22–32)
Calcium: 8.6 mg/dL — ABNORMAL LOW (ref 8.9–10.3)
Chloride: 107 mmol/L (ref 98–111)
Creatinine, Ser: 1.24 mg/dL (ref 0.61–1.24)
GFR calc Af Amer: >60 mL/min (ref >60)
GFR calc non Af Amer: 60 mL/min — ABNORMAL LOW (ref >60)
Glucose, Bld: 115 mg/dL — ABNORMAL HIGH (ref 70–99)
Potassium: 5 mmol/L (ref 3.5–5.1)
Sodium: 136 mmol/L (ref 135–145)
Total Bilirubin: 1 mg/dL (ref 0.3–1.2)
Total Protein: 6.4 g/dL — ABNORMAL LOW (ref 6.5–8.1)

## 2019-11-27 LAB — GLUCOSE, CAPILLARY
Glucose-Capillary: 115 mg/dL — ABNORMAL HIGH (ref 70–99)
Glucose-Capillary: 148 mg/dL — ABNORMAL HIGH (ref 70–99)
Glucose-Capillary: 130 mg/dL — ABNORMAL HIGH (ref 70–99)
Glucose-Capillary: 132 mg/dL — ABNORMAL HIGH (ref 70–99)

## 2019-11-27 LAB — C-REACTIVE PROTEIN: CRP: 1.3 mg/dL — ABNORMAL HIGH (ref <1.0)

## 2019-11-27 LAB — D-DIMER, QUANTITATIVE: D-Dimer, Quant: 0.52 ug/mL-FEU — ABNORMAL HIGH (ref 0.00–0.50)

## 2019-11-27 LAB — FERRITIN: Ferritin: 792 ng/mL — ABNORMAL HIGH (ref 24–336)

## 2019-11-27 MED ORDER — FUROSEMIDE 10 MG/ML IJ SOLN
INTRAMUSCULAR | Status: AC
  Filled 2019-11-27: qty 2

## 2019-11-27 MED ORDER — FUROSEMIDE 10 MG/ML IJ SOLN
60.0000 mg | Freq: Once | INTRAMUSCULAR | Status: AC
  Administered 2019-11-27: 60 mg via INTRAVENOUS
  Filled 2019-11-27: qty 6

## 2019-11-27 NOTE — Care Management Important Message (Signed)
Important Message  Patient Details IM Letter given to the Patient Name: Ronald Olsen MRN: [id number] Date of Birth: April 03, 1950   Medicare Important Message Given:  Yes     Kerin Salen 11/27/2019, 9:47 AM

## 2019-11-27 NOTE — Progress Notes (Signed)
PROGRESS NOTE    Ronald Olsen  BHA:[id number] DOB: [birth year] DOA: 11/10/2019 PCP: Pcp, No    Brief Narrative:  Patient admitted to the hospital with working diagnosis of acute hypoxic respiratory failure due to SARS COVID-19 viral pneumonia.  69 year old male with a past medical history for hypertension, dyslipidemia who presents with dyspnea, malaise, fatigue and cough for about 10 days.  Acute worsening of his symptoms over the last 72 hours prior to hospitalization.  Multiple sick contacts at home, he has not been vaccinated.  Due to severe symptoms he called EMS, his oxygen saturation was in the 70s and he was placed on a 10 L nonrebreather mask and transported to the hospital.  On his initial physical examination he was afebrile, but tachycardic and tachypneic, requiring 6 L/min of supplemental oxygen per nasal cannula.  His lungs had no wheezing but positive tachypnea, heart S1-S2, present, tachycardic, abdomen soft, no lower extremity edema.  Chest radiograph with bilateral infiltrates at the lower lobes, peripherally, more left than right.   Assessment & Plan:   Principal Problem:   Acute hypoxemic respiratory failure due to COVID-19 [facility]) Active Problems:   CAD (coronary artery disease)   Essential hypertension   Hyponatremia   Hyperlipidemia    1.  Acute hypoxic respiratory failure due to SARS COVID-19 viral pneumonia.  RR: 20  Pulse oxymetry: 93%  Fi02: 15 L/ min HFNC   COVID-19 Labs  Recent Labs    11/22/2019 1050 12/02/2019 1051 11/25/19 0559 11/26/19 0406 11/27/19 0418  DDIMER   < > 1.67* 1.15* 0.66* 0.52*  FERRITIN   < >  --  882* 991* 792*  LDH  --  375*  --   --   --   CRP   < >  --  7.1* 3.4* 1.3*   < > = values in this interval not displayed.    Lab Results  Component Value Date   SARSCOV2NAA POSITIVE (A) 11/09/2019   Patient continue on high flow oxygen 15 L/ min. Inflammatory markers are trending down. Follow up chest film this am with  increased vascular congestion, and proximal interstitial infiltrates, suggesting non cardiogenic pulmonary edema. Add 60 mg IV furosemide x1 to target negative fluid balance.   Continue medical therapy with remdesivir #4/5, barcitinib and high dose systemic corticosteroids with methylprednisolone.  Bronchodilator therapy with albuterol and combivent, antitussive agents and airway clearing techniques. Out of bed to chair and prone position as tolerated, 4 H at the time for 16 H as tolerated. Continue to follow on inflammatory markers.  2. HTN/ CAD. Holding antihypertensive medications, continue with antiplatelet therapy with asa and clopidogrel. Patient is chest pain free.   3. AKI on CKD stage 2/ Hyponatremia/ non anion gap metabolic acidosis. Stable renal function with serum cr at 1.2. Today trail of diuresis with furosemide 60 mg IV x1, follow on urine output. Strict in and out.   4. Controlled T2DM/ dyslipidemia.  On insulin sliding scale for glucose cover and monitoring.  On atorvastatin.    Patient continue to be at high risk for worsening hypoxemic respiratory failure.   Status is: Inpatient  Remains inpatient appropriate because:IV treatments appropriate due to intensity of illness or inability to take PO   Dispo:  Patient From: Home  Planned Disposition: Home with Health Care Svc  Expected discharge date:   Medically stable for discharge: No   DVT prophylaxis: Enoxaparin   Code Status:   full  Family Communication:  Subjective: Patient continue to have dyspnea, worse with minimal efforts, has been trying to lay prone, no nausea or vomiting, no chest pain.   Objective: Vitals:   11/26/19 0800 11/26/19 1315 11/26/19 2210 11/27/19 0558  BP:  116/72 119/77 104/63  Pulse:  80 78 70  Resp:  18 18 20   Temp:  97.7 F (36.5 C) 97.7 F (36.5 C) 98.2 F (36.8 C)  TempSrc:      SpO2: (!) 87% 99% 96% 93%  Weight:      Height:        Intake/Output Summary (Last  24 hours) at 11/27/2019 0825 Last data filed at 11/26/2019 1600 Gross per 24 hour  Intake --  Output 1000 ml  Net -1000 ml   Filed Weights   11/27/2019 1418  Weight: 99.8 kg    Examination:   General: Not in pain, positive dyspnea at rest, deconditioned.  Neurology: Awake and alert, non focal  E ENT: mild pallor, no icterus, oral mucosa moist Cardiovascular: No JVD. S1-S2 present, rhythmic, no gallops, rubs, or murmurs. No lower extremity edema. Pulmonary: positive breath sounds bilaterally,, no wheezing Gastrointestinal. Abdomen soft and non tender Skin. No rashes Musculoskeletal: no joint deformities     Data Reviewed: I have personally reviewed following labs and imaging studies  CBC: Recent Labs  Lab 11/05/2019 1051 11/25/19 0559 11/26/19 0406  WBC 9.8 8.0 13.6*  NEUTROABS 7.8* 5.7 10.6*  HGB 14.6 13.2 13.4  HCT 43.0 40.5 40.0  MCV 91.7 94.0 92.2  PLT 196 209 881   Basic Metabolic Panel: Recent Labs  Lab 12/02/2019 1051 11/25/19 0559 11/26/19 0406 11/27/19 0418  NA 130* 136 132* 136  K 4.3 4.9 4.5 5.0  CL 102 106 106 107  CO2 19* 21* 16* 19*  GLUCOSE 113* 125* 123* 115*  BUN 35* 33* 42* 44*  CREATININE 1.49* 1.24 1.25* 1.24  CALCIUM 8.1* 8.4* 8.2* 8.6*  MG  --  2.8* 2.8*  --    GFR: Estimated Creatinine Clearance: 69.6 mL/min (by C-G formula based on SCr of 1.24 mg/dL). Liver Function Tests: Recent Labs  Lab 11/18/2019 1051 11/25/19 0559 11/26/19 0406 11/27/19 0418  AST 42* 42* 44* 47*  ALT 50* 45* 45* 45*  ALKPHOS 57 54 52 59  BILITOT 1.1 1.1 0.8 1.0  PROT 7.1 6.5 6.3* 6.4*  ALBUMIN 3.8 3.6 3.3* 3.3*   No results for input(s): LIPASE, AMYLASE in the last 168 hours. No results for input(s): AMMONIA in the last 168 hours. Coagulation Profile: No results for input(s): INR, PROTIME in the last 168 hours. Cardiac Enzymes: No results for input(s): CKTOTAL, CKMB, CKMBINDEX, TROPONINI in the last 168 hours. BNP (last 3 results) No results for  input(s): PROBNP in the last 8760 hours. HbA1C: Recent Labs    11/25/19 0559  HGBA1C 5.9*   CBG: Recent Labs  Lab 11/26/19 0803 11/26/19 1122 11/26/19 1625 11/26/19 2107 11/27/19 0757  GLUCAP 110* 137* 134* 146* 115*   Lipid Profile: Recent Labs    11/10/2019 1310  TRIG 54   Thyroid Function Tests: No results for input(s): TSH, T4TOTAL, FREET4, T3FREE, THYROIDAB in the last 72 hours. Anemia Panel: Recent Labs    11/26/19 0406 11/27/19 0418  FERRITIN 991* 792*      Radiology Studies: I have reviewed all of the imaging during this hospital visit personally     Scheduled Meds: . alfuzosin  10 mg Oral Q breakfast  . aspirin EC  81 mg Oral Daily  .  atorvastatin  40 mg Oral Daily  . baricitinib  4 mg Oral Daily  . chlorpheniramine-HYDROcodone  5 mL Oral Q12H  . clopidogrel  75 mg Oral Daily  . cyclobenzaprine  10 mg Oral BID  . enoxaparin (LOVENOX) injection  40 mg Subcutaneous Q24H  . finasteride  5 mg Oral Daily  . insulin aspart  0-5 Units Subcutaneous QHS  . insulin aspart  0-9 Units Subcutaneous TID WC  . Ipratropium-Albuterol  1 puff Inhalation Q6H  . methylPREDNISolone (SOLU-MEDROL) injection  60 mg Intravenous Q12H  . metoprolol tartrate  5 mg Intravenous Q12H  . pantoprazole  40 mg Oral Daily  . sodium chloride flush  3 mL Intravenous Q12H   Continuous Infusions: . remdesivir 100 mg in NS 100 mL 100 mg (11/26/19 1038)     LOS: 3 days        Zafir Schauer Gerome Apley, MD

## 2019-11-28 LAB — COMPREHENSIVE METABOLIC PANEL
ALT: 52 U/L — ABNORMAL HIGH (ref 0–44)
AST: 44 U/L — ABNORMAL HIGH (ref 15–41)
Albumin: 3.5 g/dL (ref 3.5–5.0)
Alkaline Phosphatase: 66 U/L (ref 38–126)
Anion gap: 11 (ref 5–15)
BUN: 50 mg/dL — ABNORMAL HIGH (ref 8–23)
CO2: 19 mmol/L — ABNORMAL LOW (ref 22–32)
Calcium: 8.8 mg/dL — ABNORMAL LOW (ref 8.9–10.3)
Chloride: 103 mmol/L (ref 98–111)
Creatinine, Ser: 1.33 mg/dL — ABNORMAL HIGH (ref 0.61–1.24)
GFR calc Af Amer: >60 mL/min (ref >60)
GFR calc non Af Amer: 55 mL/min — ABNORMAL LOW (ref >60)
Glucose, Bld: 122 mg/dL — ABNORMAL HIGH (ref 70–99)
Potassium: 5.1 mmol/L (ref 3.5–5.1)
Sodium: 133 mmol/L — ABNORMAL LOW (ref 135–145)
Total Bilirubin: 1 mg/dL (ref 0.3–1.2)
Total Protein: 6.7 g/dL (ref 6.5–8.1)

## 2019-11-28 LAB — GLUCOSE, CAPILLARY
Glucose-Capillary: 87 mg/dL (ref 70–99)
Glucose-Capillary: 74 mg/dL (ref 70–99)
Glucose-Capillary: 128 mg/dL — ABNORMAL HIGH (ref 70–99)
Glucose-Capillary: 139 mg/dL — ABNORMAL HIGH (ref 70–99)

## 2019-11-28 LAB — FERRITIN: Ferritin: 729 ng/mL — ABNORMAL HIGH (ref 24–336)

## 2019-11-28 LAB — C-REACTIVE PROTEIN: CRP: 1.5 mg/dL — ABNORMAL HIGH (ref <1.0)

## 2019-11-28 LAB — D-DIMER, QUANTITATIVE: D-Dimer, Quant: 0.46 ug{FEU}/mL (ref 0.00–0.50)

## 2019-11-28 MED ORDER — BARICITINIB 2 MG PO TABS
2.0000 mg | ORAL_TABLET | Freq: Every day | ORAL | Status: DC
  Administered 2019-11-29 – 2019-11-30 (×2): 2 mg via ORAL
  Filled 2019-11-28 (×2): qty 1

## 2019-11-28 NOTE — Progress Notes (Signed)
PROGRESS NOTE    Ronald Olsen  UDJ:[id number] DOB: March 08, 1950 DOA: 11/13/2019 PCP: Pcp, No    Brief Narrative:  Patient admitted to the hospital with working diagnosis of acute hypoxic respiratory failure due to SARS COVID-19 viral pneumonia.  69 year old male with a past medical history for hypertension, dyslipidemia who presents with dyspnea, malaise, fatigue and cough for about 10 days. Acute worsening of his symptoms over the last 72 hours prior to hospitalization. Multiple sick contacts at home, he has not been vaccinated. Due to severe symptoms he called EMS, his oxygen saturation was in the 70s and he was placed on a 10 L nonrebreather mask and transported to the hospital. On his initial physical examination he was afebrile, but tachycardic and tachypneic, requiring 6 L/min of supplemental oxygen per nasal cannula. His lungs had no wheezing but positive tachypnea, heart S1-S2, present, tachycardic, abdomen soft, no lower extremity edema.  Chest radiograph with bilateral infiltrates at the lower lobes, peripherally, more left than right.  Patient with persistent high oxygen requirements per HFNC, added furosemide x1 for non cardiogenic pulmonary edema.    Assessment & Plan:   Principal Problem:   Acute hypoxemic respiratory failure due to COVID-19 [facility]) Active Problems:   CAD (coronary artery disease)   Essential hypertension   Hyponatremia   Hyperlipidemia   1.Acute hypoxic respiratory failure due to SARS COVID-19 viral pneumonia.  RR: 20  Pulse oxymetry: 92 to 96%  Fi02: 15 L/min per HFNC  COVID-19 Labs  Recent Labs    11/26/19 0406 11/27/19 0418 11/28/19 0401  DDIMER 0.66* 0.52* 0.46  FERRITIN 991* 792* 729*  CRP 3.4* 1.3* 1.5*    Lab Results  Component Value Date   SARSCOV2NAA POSITIVE (A) 11/06/2019    Patient's dyspnea has been improving,inflammatory markers are stable. Has been out of bed to the chair with physical therapy.    Oxygenation is more than 88%, will do a trial of 13 L/ min per HFNC today. For now will hold on further diuresis.  Continue medical therapy with systemic steroids (methylprednisolone 60 mg IV q12) and baricitinib. Completed today #5/5 of Remdesivir.  On bronchodilator therapy, antitussive agents and airway clearing techniques. Having difficulty staying prone but tolerating well lateral decubitus.   2. HTN/ CAD. On dual antiplatelet therapy with asa and clopidogrel. Blood pressure controlled. Not on antihypertensive medications  3. AKI on CKD stage 2/ Hyponatremia/ non anion gap metabolic acidosis. Urine output over last 24 H 2,900 ml, with improvement of dyspnea. Renal function stable with serum cr at 1,33 with K at 5,1 and bicarbonate at 19.  Hold on diuresis for now and will follow up on renal function and electrolytes in am.   4. Controlled T2DM/ dyslipidemia. Continue with insulin sliding scale for glucose cover and monitoring. Patient is tolerating po well.   Patient continue to be at high risk for worsening hypoxic respiratory failure.   Status is: Inpatient  Remains inpatient appropriate because:IV treatments appropriate due to intensity of illness or inability to take PO   Dispo:  Patient From: Home  Planned Disposition: Home with Health Care Svc  Expected discharge date:   Medically stable for discharge: No    DVT prophylaxis: Enoxaparin   Code Status:   full  Family Communication:  I spoke over the phone with the patient's  about patient's  condition, plan of care, prognosis and all questions were addressed.    Subjective: Patient with stable dyspnea, worse with movement, has been working with incentive  spirometer and flutter valve.   Objective: Vitals:   11/27/19 1334 11/27/19 2019 11/28/19 0538 11/28/19 0926  BP: 103/71 122/75 104/60 105/61  Pulse: 92 87 74 92  Resp: (!) 22 20 20    Temp: 98.4 F (36.9 C) 97.8 F (36.6 C) 97.8 F (36.6 C)   TempSrc: Oral   Oral   SpO2: (!) 85% 93% 92% 96%  Weight:      Height:        Intake/Output Summary (Last 24 hours) at 11/28/2019 1324 Last data filed at 11/28/2019 0855 Gross per 24 hour  Intake 944 ml  Output 2700 ml  Net -1756 ml   Filed Weights   11/25/2019 1418  Weight: 99.8 kg    Examination:   General: Not in pain or dyspnea, deconditioned  Neurology: Awake and alert, non focal  E ENT: mild pallor, no icterus, oral mucosa moist Cardiovascular: No JVD. S1-S2 present, rhythmic, no gallops, rubs, or murmurs. No lower extremity edema. Pulmonary: positive breath sounds bilaterally,no wheezing Gastrointestinal. Abdomen soft and non tender Skin. No rashes Musculoskeletal: no joint deformities     Data Reviewed: I have personally reviewed following labs and imaging studies  CBC: Recent Labs  Lab 12/03/2019 1051 11/25/19 0559 11/26/19 0406  WBC 9.8 8.0 13.6*  NEUTROABS 7.8* 5.7 10.6*  HGB 14.6 13.2 13.4  HCT 43.0 40.5 40.0  MCV 91.7 94.0 92.2  PLT 196 209 401   Basic Metabolic Panel: Recent Labs  Lab 11/09/2019 1051 11/25/19 0559 11/26/19 0406 11/27/19 0418 11/28/19 0401  NA 130* 136 132* 136 133*  K 4.3 4.9 4.5 5.0 5.1  CL 102 106 106 107 103  CO2 19* 21* 16* 19* 19*  GLUCOSE 113* 125* 123* 115* 122*  BUN 35* 33* 42* 44* 50*  CREATININE 1.49* 1.24 1.25* 1.24 1.33*  CALCIUM 8.1* 8.4* 8.2* 8.6* 8.8*  MG  --  2.8* 2.8*  --   --    GFR: Estimated Creatinine Clearance: 64.9 mL/min (A) (by C-G formula based on SCr of 1.33 mg/dL (H)). Liver Function Tests: Recent Labs  Lab 11/16/2019 1051 11/25/19 0559 11/26/19 0406 11/27/19 0418 11/28/19 0401  AST 42* 42* 44* 47* 44*  ALT 50* 45* 45* 45* 52*  ALKPHOS 57 54 52 59 66  BILITOT 1.1 1.1 0.8 1.0 1.0  PROT 7.1 6.5 6.3* 6.4* 6.7  ALBUMIN 3.8 3.6 3.3* 3.3* 3.5   No results for input(s): LIPASE, AMYLASE in the last 168 hours. No results for input(s): AMMONIA in the last 168 hours. Coagulation Profile: No results for  input(s): INR, PROTIME in the last 168 hours. Cardiac Enzymes: No results for input(s): CKTOTAL, CKMB, CKMBINDEX, TROPONINI in the last 168 hours. BNP (last 3 results) No results for input(s): PROBNP in the last 8760 hours. HbA1C: No results for input(s): HGBA1C in the last 72 hours. CBG: Recent Labs  Lab 11/27/19 0757 11/27/19 1122 11/27/19 1641 11/27/19 2020 11/28/19 0746  GLUCAP 115* 148* 130* 132* 87   Lipid Profile: No results for input(s): CHOL, HDL, LDLCALC, TRIG, CHOLHDL, LDLDIRECT in the last 72 hours. Thyroid Function Tests: No results for input(s): TSH, T4TOTAL, FREET4, T3FREE, THYROIDAB in the last 72 hours. Anemia Panel: Recent Labs    11/27/19 0418 11/28/19 0401  FERRITIN 792* 729*      Radiology Studies: I have reviewed all of the imaging during this hospital visit personally     Scheduled Meds: . alfuzosin  10 mg Oral Q breakfast  . aspirin EC  81 mg  Oral Daily  . atorvastatin  40 mg Oral Daily  . baricitinib  4 mg Oral Daily  . chlorpheniramine-HYDROcodone  5 mL Oral Q12H  . clopidogrel  75 mg Oral Daily  . cyclobenzaprine  10 mg Oral BID  . enoxaparin (LOVENOX) injection  40 mg Subcutaneous Q24H  . finasteride  5 mg Oral Daily  . insulin aspart  0-5 Units Subcutaneous QHS  . insulin aspart  0-9 Units Subcutaneous TID WC  . Ipratropium-Albuterol  1 puff Inhalation Q6H  . methylPREDNISolone (SOLU-MEDROL) injection  60 mg Intravenous Q12H  . metoprolol tartrate  5 mg Intravenous Q12H  . pantoprazole  40 mg Oral Daily  . sodium chloride flush  3 mL Intravenous Q12H   Continuous Infusions: . remdesivir 100 mg in NS 100 mL 100 mg (11/27/19 1019)     LOS: 4 days        Tal Kempker Gerome Apley, MD

## 2019-11-29 LAB — COMPREHENSIVE METABOLIC PANEL
ALT: 50 U/L — ABNORMAL HIGH (ref 0–44)
AST: 35 U/L (ref 15–41)
Albumin: 3.4 g/dL — ABNORMAL LOW (ref 3.5–5.0)
Alkaline Phosphatase: 66 U/L (ref 38–126)
Anion gap: 8 (ref 5–15)
BUN: 48 mg/dL — ABNORMAL HIGH (ref 8–23)
CO2: 24 mmol/L (ref 22–32)
Calcium: 8.9 mg/dL (ref 8.9–10.3)
Chloride: 103 mmol/L (ref 98–111)
Creatinine, Ser: 1.25 mg/dL — ABNORMAL HIGH (ref 0.61–1.24)
GFR calc Af Amer: >60 mL/min (ref >60)
GFR calc non Af Amer: 59 mL/min — ABNORMAL LOW (ref >60)
Glucose, Bld: 110 mg/dL — ABNORMAL HIGH (ref 70–99)
Potassium: 5.6 mmol/L — ABNORMAL HIGH (ref 3.5–5.1)
Sodium: 135 mmol/L (ref 135–145)
Total Bilirubin: 0.9 mg/dL (ref 0.3–1.2)
Total Protein: 6.9 g/dL (ref 6.5–8.1)

## 2019-11-29 LAB — CULTURE, BLOOD (ROUTINE X 2)
Culture: NO GROWTH
Culture: NO GROWTH
Special Requests: ADEQUATE
Special Requests: ADEQUATE

## 2019-11-29 LAB — D-DIMER, QUANTITATIVE: D-Dimer, Quant: 0.59 ug/mL-FEU — ABNORMAL HIGH (ref 0.00–0.50)

## 2019-11-29 LAB — FERRITIN: Ferritin: 727 ng/mL — ABNORMAL HIGH (ref 24–336)

## 2019-11-29 LAB — GLUCOSE, CAPILLARY
Glucose-Capillary: 92 mg/dL (ref 70–99)
Glucose-Capillary: 152 mg/dL — ABNORMAL HIGH (ref 70–99)
Glucose-Capillary: 139 mg/dL — ABNORMAL HIGH (ref 70–99)
Glucose-Capillary: 154 mg/dL — ABNORMAL HIGH (ref 70–99)

## 2019-11-29 LAB — C-REACTIVE PROTEIN: CRP: 5.8 mg/dL — ABNORMAL HIGH (ref <1.0)

## 2019-11-29 MED ORDER — ALPRAZOLAM 1 MG PO TABS
1.0000 mg | ORAL_TABLET | Freq: Three times a day (TID) | ORAL | Status: DC | PRN
  Administered 2019-11-29 – 2019-12-08 (×13): 1 mg via ORAL
  Filled 2019-11-29 (×13): qty 1

## 2019-11-29 MED ORDER — METHYLPREDNISOLONE SODIUM SUCC 125 MG IJ SOLR
60.0000 mg | Freq: Three times a day (TID) | INTRAMUSCULAR | Status: DC
  Administered 2019-11-29 – 2019-12-02 (×10): 60 mg via INTRAVENOUS
  Filled 2019-11-29 (×10): qty 2

## 2019-11-29 MED ORDER — SALINE SPRAY 0.65 % NA SOLN
1.0000 | NASAL | Status: DC | PRN
  Administered 2019-11-29 – 2019-12-06 (×2): 1 via NASAL
  Filled 2019-11-29: qty 44

## 2019-11-29 NOTE — Progress Notes (Signed)
PROGRESS NOTE    Ronald Olsen  IOE:[id number] DOB: 12/19/1950 DOA: 11/14/2019 PCP: Pcp, No    Brief Narrative:  Patient admitted to the hospital with working diagnosis of acute hypoxic respiratory failure due to SARS COVID-19 viral pneumonia.  69 year old male with a past medical history for hypertension, dyslipidemia who presents with dyspnea, malaise, fatigue and cough for about 10 days. Acute worsening of his symptoms over the last 72 hours prior to hospitalization. Multiple sick contacts at home, he has not been vaccinated. Due to severe symptoms he called EMS, his oxygen saturation was in the 70s and he was placed on a 10 L nonrebreather mask and transported to the hospital. On his initial physical examination he was afebrile, but tachycardic and tachypneic, requiring 6 L/min of supplemental oxygen per nasal cannula. His lungs had no wheezing but positive tachypnea, heart S1-S2, present, tachycardic, abdomen soft, no lower extremity edema.  Chest radiograph with bilateral infiltrates at the lower lobes, peripherally, more left than right.  Patient with persistent high oxygen requirements per HFNC, added furosemide x1 for non cardiogenic pulmonary edema.    Assessment & Plan:   Principal Problem:   Acute hypoxemic respiratory failure due to COVID-19 [facility]) Active Problems:   CAD (coronary artery disease)   Essential hypertension   Hyponatremia   Hyperlipidemia   1.Acute hypoxic respiratory failure due to SARS COVID-19 viral pneumonia. Sp Remdesivir #5/5  RR: 23 Pulse oxymetry: 93%   Fi02: 15 L/ min HFNC and NRBM  COVID-19 Labs  Recent Labs    11/27/19 0418 11/28/19 0401 11/29/19 0558  DDIMER 0.52* 0.46 0.59*  FERRITIN 792* 729* 727*  CRP 1.3* 1.5* 5.8*    Lab Results  Component Value Date   SARSCOV2NAA POSITIVE (A) 11/08/2019    Patient with worsening oxygenation, now using a NRBM, inflammatory markers continue to be elevated.  Aggressive  medical therapy with systemic steroids (methylprednisolone 60 mg IV q12) and baricitinib. Continue with bronchodilator therapy, antitussive agents and airway clearing techniques. Lateral decubitus as tolerated.     2. HTN/ CAD.Continue with dual antiplatelet therapy with asa and clopidogrel.  3. AKI on CKD stage 2/ Hyponatremia/ non anion gap metabolic acidosis/hyperkalemia. Renal function with serum cr at 1,25 with K up to 5,6 and serum bicarbonate at 24. Continue to hold on further diuresis for now.  Add 3 doses of sodium zirconium and follow up on renal function in am.   4. Controlled T2DM/ dyslipidemia.Fasting glucose is 110 continue withinsulin sliding scale for glucose cover and monitoring. Patient is tolerating po well.    Patient continue to be at high risk for worsening hypoxemic respiratory failure.   Status is: Inpatient  Remains inpatient appropriate because:IV treatments appropriate due to intensity of illness or inability to take PO   Dispo:  Patient From: Home  Planned Disposition: Home with Health Care Svc  Expected discharge date:   Medically stable for discharge: No    DVT prophylaxis: Enoxaparin   Code Status:   full  Family Communication:  I spoke over the phone with the patient's wife about patient's  condition, plan of care, prognosis and all questions were addressed.     Subjective: Patient reports stable dyspnea. Per nursing patient with rapid oxygen desaturation with minimal efforts. No nausea or vomiting.   Objective: Vitals:   11/28/19 1330 11/28/19 2137 11/29/19 0525 11/29/19 0600  BP: (!) 104/59 (!) 150/93 114/72   Pulse: 99 96 71   Resp: 20 16 (!) 23   Temp:  98.8 F (37.1 C) 98.3 F (36.8 C) 97.7 F (36.5 C)   TempSrc: Oral Oral    SpO2: 93% 96% (!) 88% 93%  Weight:      Height:        Intake/Output Summary (Last 24 hours) at 11/29/2019 0946 Last data filed at 11/29/2019 3903 Gross per 24 hour  Intake 0 ml  Output 1175 ml    Net -1175 ml   Filed Weights   12/02/2019 1418  Weight: 99.8 kg    Examination:   General: deconditioned and ill looking appearing  Neurology: Awake and alert, non focal  E ENT: no pallor, no icterus, oral mucosa moist Cardiovascular: No JVD. S1-S2 present, rhythmic, no gallops, rubs, or murmurs. No lower extremity edema. Pulmonary: positive breath sounds bilaterally, no wheezing, rhonchi or rales. Gastrointestinal. Abdomen soft and non tender Skin. No rashes Musculoskeletal: no joint deformities     Data Reviewed: I have personally reviewed following labs and imaging studies  CBC: Recent Labs  Lab 11/14/2019 1051 11/25/19 0559 11/26/19 0406  WBC 9.8 8.0 13.6*  NEUTROABS 7.8* 5.7 10.6*  HGB 14.6 13.2 13.4  HCT 43.0 40.5 40.0  MCV 91.7 94.0 92.2  PLT 196 209 009   Basic Metabolic Panel: Recent Labs  Lab 11/25/19 0559 11/26/19 0406 11/27/19 0418 11/28/19 0401 11/29/19 0558  NA 136 132* 136 133* 135  K 4.9 4.5 5.0 5.1 5.6*  CL 106 106 107 103 103  CO2 21* 16* 19* 19* 24  GLUCOSE 125* 123* 115* 122* 110*  BUN 33* 42* 44* 50* 48*  CREATININE 1.24 1.25* 1.24 1.33* 1.25*  CALCIUM 8.4* 8.2* 8.6* 8.8* 8.9  MG 2.8* 2.8*  --   --   --    GFR: Estimated Creatinine Clearance: 69 mL/min (A) (by C-G formula based on SCr of 1.25 mg/dL (H)). Liver Function Tests: Recent Labs  Lab 11/25/19 0559 11/26/19 0406 11/27/19 0418 11/28/19 0401 11/29/19 0558  AST 42* 44* 47* 44* 35  ALT 45* 45* 45* 52* 50*  ALKPHOS 54 52 59 66 66  BILITOT 1.1 0.8 1.0 1.0 0.9  PROT 6.5 6.3* 6.4* 6.7 6.9  ALBUMIN 3.6 3.3* 3.3* 3.5 3.4*   No results for input(s): LIPASE, AMYLASE in the last 168 hours. No results for input(s): AMMONIA in the last 168 hours. Coagulation Profile: No results for input(s): INR, PROTIME in the last 168 hours. Cardiac Enzymes: No results for input(s): CKTOTAL, CKMB, CKMBINDEX, TROPONINI in the last 168 hours. BNP (last 3 results) No results for input(s): PROBNP  in the last 8760 hours. HbA1C: No results for input(s): HGBA1C in the last 72 hours. CBG: Recent Labs  Lab 11/28/19 0746 11/28/19 1112 11/28/19 1623 11/28/19 2138 11/29/19 0729  GLUCAP 87 74 128* 139* 92   Lipid Profile: No results for input(s): CHOL, HDL, LDLCALC, TRIG, CHOLHDL, LDLDIRECT in the last 72 hours. Thyroid Function Tests: No results for input(s): TSH, T4TOTAL, FREET4, T3FREE, THYROIDAB in the last 72 hours. Anemia Panel: Recent Labs    11/28/19 0401 11/29/19 0558  FERRITIN 729* 727*      Radiology Studies: I have reviewed all of the imaging during this hospital visit personally     Scheduled Meds: . alfuzosin  10 mg Oral Q breakfast  . aspirin EC  81 mg Oral Daily  . atorvastatin  40 mg Oral Daily  . baricitinib  2 mg Oral Daily  . chlorpheniramine-HYDROcodone  5 mL Oral Q12H  . clopidogrel  75 mg Oral Daily  .  cyclobenzaprine  10 mg Oral BID  . enoxaparin (LOVENOX) injection  40 mg Subcutaneous Q24H  . finasteride  5 mg Oral Daily  . insulin aspart  0-5 Units Subcutaneous QHS  . insulin aspart  0-9 Units Subcutaneous TID WC  . Ipratropium-Albuterol  1 puff Inhalation Q6H  . methylPREDNISolone (SOLU-MEDROL) injection  60 mg Intravenous Q12H  . metoprolol tartrate  5 mg Intravenous Q12H  . pantoprazole  40 mg Oral Daily  . sodium chloride flush  3 mL Intravenous Q12H   Continuous Infusions:   LOS: 5 days        Aleeha Boline Gerome Apley, MD

## 2019-11-30 LAB — BASIC METABOLIC PANEL
Anion gap: 11 (ref 5–15)
BUN: 44 mg/dL — ABNORMAL HIGH (ref 8–23)
CO2: 20 mmol/L — ABNORMAL LOW (ref 22–32)
Calcium: 8.9 mg/dL (ref 8.9–10.3)
Chloride: 102 mmol/L (ref 98–111)
Creatinine, Ser: 1.05 mg/dL (ref 0.61–1.24)
GFR calc Af Amer: >60 mL/min (ref >60)
GFR calc non Af Amer: >60 mL/min (ref >60)
Glucose, Bld: 174 mg/dL — ABNORMAL HIGH (ref 70–99)
Potassium: 4.9 mmol/L (ref 3.5–5.1)
Sodium: 133 mmol/L — ABNORMAL LOW (ref 135–145)

## 2019-11-30 LAB — GLUCOSE, CAPILLARY
Glucose-Capillary: 133 mg/dL — ABNORMAL HIGH (ref 70–99)
Glucose-Capillary: 177 mg/dL — ABNORMAL HIGH (ref 70–99)
Glucose-Capillary: 178 mg/dL — ABNORMAL HIGH (ref 70–99)
Glucose-Capillary: 123 mg/dL — ABNORMAL HIGH (ref 70–99)

## 2019-11-30 LAB — FERRITIN: Ferritin: 662 ng/mL — ABNORMAL HIGH (ref 24–336)

## 2019-11-30 LAB — D-DIMER, QUANTITATIVE: D-Dimer, Quant: 0.63 ug/mL-FEU — ABNORMAL HIGH (ref 0.00–0.50)

## 2019-11-30 LAB — C-REACTIVE PROTEIN: CRP: 1.9 mg/dL — ABNORMAL HIGH (ref <1.0)

## 2019-11-30 MED ORDER — IPRATROPIUM-ALBUTEROL 20-100 MCG/ACT IN AERS
1.0000 | INHALATION_SPRAY | Freq: Four times a day (QID) | RESPIRATORY_TRACT | Status: DC
  Administered 2019-11-30 – 2019-12-09 (×37): 1 via RESPIRATORY_TRACT
  Filled 2019-11-30: qty 4

## 2019-11-30 MED ORDER — BARICITINIB 2 MG PO TABS
4.0000 mg | ORAL_TABLET | Freq: Every day | ORAL | Status: AC
  Administered 2019-12-01 – 2019-12-07 (×7): 4 mg via ORAL
  Filled 2019-11-30 (×7): qty 2

## 2019-11-30 NOTE — Progress Notes (Signed)
I personally spoke w/ Dr. Herbert Moors about patient and his oxygen demands.

## 2019-11-30 NOTE — Plan of Care (Signed)
  Problem: Education: Goal: Knowledge of risk factors and measures for prevention of condition will improve Outcome: Progressing   Problem: Coping: Goal: Psychosocial and spiritual needs will be supported Outcome: Progressing   Problem: Respiratory: Goal: Will maintain a patent airway Outcome: Progressing Goal: Complications related to the disease process, condition or treatment will be avoided or minimized Outcome: Progressing   

## 2019-11-30 NOTE — Progress Notes (Addendum)
At the beginning of shift patient is on 15L of HFNC and 15L NRB. Patient sats are 85-93%. Patient sats drop when he moves or sits up to eat. Notified charge nurse Abigail Butts. Waiting for MD to make rounds to see patient.

## 2019-11-30 NOTE — Progress Notes (Signed)
PROGRESS NOTE    Ronald Olsen  OFB:[id number] DOB: April 01, 1950 DOA: 11/25/2019 PCP: Pcp, No    Brief Narrative:  Patient admitted to the hospital with working diagnosis of acute hypoxic respiratory failure due to SARS COVID-19 viral pneumonia.  69 year old male with a past medical history for hypertension, dyslipidemia who presents with dyspnea, malaise, fatigue and cough for about 10 days. Acute worsening of his symptoms over the last 72 hours prior to hospitalization. Multiple sick contacts at home, he has not been vaccinated. Due to severe symptoms he called EMS, his oxygen saturation was in the 70s and he was placed on a 10 L nonrebreather mask and transported to the hospital. On his initial physical examination he was afebrile, but tachycardic and tachypneic, requiring 6 L/min of supplemental oxygen per nasal cannula. His lungs had no wheezing but positive tachypnea, heart S1-S2, present, tachycardic, abdomen soft, no lower extremity edema.  Chest radiograph with bilateral infiltrates at the lower lobes, peripherally, more left than right.  Patient with persistent high oxygen requirements per HFNC, added furosemide x1 for non cardiogenic pulmonary edema.  Worsening oxygen requirements, now on 15l/min per HFNC plus NRBM, with desaturation with movement.   Assessment & Plan:   Principal Problem:   Acute hypoxemic respiratory failure due to COVID-19 [facility]) Active Problems:   CAD (coronary artery disease)   Essential hypertension   Hyponatremia   Hyperlipidemia   1.Acute hypoxic respiratory failure due to SARS COVID-19 viral pneumonia. Sp Remdesivir #5/5  RR: 18 Pulse oxymetry: 91%  Fi02: 15 L/min per HFNC plus NRBM  COVID-19 Labs  Recent Labs    11/28/19 0401 11/29/19 0558 11/30/19 1133  DDIMER 0.46 0.59* 0.63*  FERRITIN 729* 727* 662*  CRP 1.5* 5.8* 1.9*    Lab Results  Component Value Date   SARSCOV2NAA POSITIVE (A) 11/21/2019    Persistent  elevation in inflammatory markers and increased oxygen requirements, continue need NRBM on top of HFNC. Rapid desaturation with movement.    Continue with aggressive medical therapy with baricitinib. High dose systemic steroids with methylprednisolone 60 mg IV q8. Bronchodilator therapy, antitussive agents and airway clearing techniques.  Encourage prone position for 4 H at the time to target 16 H per day.   Keep oxygen saturation more than 88%, continue to follow up inflammatory markers.    2. HTN/ CAD.On dual antiplatelet therapy with asa and clopidogrel.Holding antihypertensive medications, to prevent hypotension. Hold on metoprolol. At home on lisinopril.   3. AKI on CKD stage 2/ Hyponatremia/ non anion gap metabolic acidosis/hyperkalemia. Serum cr down to 1,0 with K at 4,9 and bicarbonate at 20. Continue close follow up on renal function and electrolytes. Encourage po intake, avoid IV fluids in the setting of acute viral pneumonia.    4. Controlled T2DM/ dyslipidemia.Fasting glucose is 174, Oninsulin sliding scale for glucose cover and monitoring. Continue with atorvastatin.   5. Anxiety. Continue alprazolam as needed for anxiety.   Patient continue to be at high risk for worsening respiratory failure.  Status is: Inpatient  Remains inpatient appropriate because:IV treatments appropriate due to intensity of illness or inability to take PO   Dispo:  Patient From: Home  Planned Disposition: Home with Health Care Svc  Expected discharge date:   Medically stable for discharge: No    DVT prophylaxis: Enoxaparin   Code Status:   full  Family Communication:  I spoke over the phone with the patient's   about patient's  condition, plan of care, prognosis and all questions were  addressed.      Subjective: Patient continue to have rapid oxygen desaturation with minimal movement, positive dyspnea, no nausea or vomiting, no chest pain.   Objective: Vitals:   11/30/19  0600 11/30/19 0811 11/30/19 0813 11/30/19 0919  BP:    125/89  Pulse:    78  Resp:      Temp:      TempSrc:      SpO2: 92% 91% 91%   Weight:      Height:        Intake/Output Summary (Last 24 hours) at 11/30/2019 1055 Last data filed at 11/30/2019 0900 Gross per 24 hour  Intake 1140 ml  Output 450 ml  Net 690 ml   Filed Weights   12/04/2019 1418  Weight: 99.8 kg    Examination:   General: Not in pain or dyspnea, deconditioned  Neurology: Awake and alert, non focal  E ENT: mild pallor, no icterus, oral mucosa moist Cardiovascular: No JVD. S1-S2 present, rhythmic, no gallops, rubs, or murmurs. No lower extremity edema. Pulmonary: positive breath sounds bilaterally, bilateral rales Gastrointestinal. Abdomen soft and non tender Skin. No rashes Musculoskeletal: no joint deformities     Data Reviewed: I have personally reviewed following labs and imaging studies  CBC: Recent Labs  Lab 11/19/2019 1051 11/25/19 0559 11/26/19 0406  WBC 9.8 8.0 13.6*  NEUTROABS 7.8* 5.7 10.6*  HGB 14.6 13.2 13.4  HCT 43.0 40.5 40.0  MCV 91.7 94.0 92.2  PLT 196 209 026   Basic Metabolic Panel: Recent Labs  Lab 11/25/19 0559 11/26/19 0406 11/27/19 0418 11/28/19 0401 11/29/19 0558  NA 136 132* 136 133* 135  K 4.9 4.5 5.0 5.1 5.6*  CL 106 106 107 103 103  CO2 21* 16* 19* 19* 24  GLUCOSE 125* 123* 115* 122* 110*  BUN 33* 42* 44* 50* 48*  CREATININE 1.24 1.25* 1.24 1.33* 1.25*  CALCIUM 8.4* 8.2* 8.6* 8.8* 8.9  MG 2.8* 2.8*  --   --   --    GFR: Estimated Creatinine Clearance: 69 mL/min (A) (by C-G formula based on SCr of 1.25 mg/dL (H)). Liver Function Tests: Recent Labs  Lab 11/25/19 0559 11/26/19 0406 11/27/19 0418 11/28/19 0401 11/29/19 0558  AST 42* 44* 47* 44* 35  ALT 45* 45* 45* 52* 50*  ALKPHOS 54 52 59 66 66  BILITOT 1.1 0.8 1.0 1.0 0.9  PROT 6.5 6.3* 6.4* 6.7 6.9  ALBUMIN 3.6 3.3* 3.3* 3.5 3.4*   No results for input(s): LIPASE, AMYLASE in the last 168  hours. No results for input(s): AMMONIA in the last 168 hours. Coagulation Profile: No results for input(s): INR, PROTIME in the last 168 hours. Cardiac Enzymes: No results for input(s): CKTOTAL, CKMB, CKMBINDEX, TROPONINI in the last 168 hours. BNP (last 3 results) No results for input(s): PROBNP in the last 8760 hours. HbA1C: No results for input(s): HGBA1C in the last 72 hours. CBG: Recent Labs  Lab 11/29/19 0729 11/29/19 1124 11/29/19 1609 11/29/19 2154 11/30/19 0801  GLUCAP 92 152* 139* 154* 133*   Lipid Profile: No results for input(s): CHOL, HDL, LDLCALC, TRIG, CHOLHDL, LDLDIRECT in the last 72 hours. Thyroid Function Tests: No results for input(s): TSH, T4TOTAL, FREET4, T3FREE, THYROIDAB in the last 72 hours. Anemia Panel: Recent Labs    11/28/19 0401 11/29/19 0558  FERRITIN 729* 727*      Radiology Studies: I have reviewed all of the imaging during this hospital visit personally     Scheduled Meds: . alfuzosin  10 mg Oral Q breakfast  . aspirin EC  81 mg Oral Daily  . atorvastatin  40 mg Oral Daily  . baricitinib  2 mg Oral Daily  . chlorpheniramine-HYDROcodone  5 mL Oral Q12H  . clopidogrel  75 mg Oral Daily  . cyclobenzaprine  10 mg Oral BID  . enoxaparin (LOVENOX) injection  40 mg Subcutaneous Q24H  . finasteride  5 mg Oral Daily  . insulin aspart  0-5 Units Subcutaneous QHS  . insulin aspart  0-9 Units Subcutaneous TID WC  . Ipratropium-Albuterol  1 puff Inhalation QID  . methylPREDNISolone (SOLU-MEDROL) injection  60 mg Intravenous Q8H  . metoprolol tartrate  5 mg Intravenous Q12H  . pantoprazole  40 mg Oral Daily  . sodium chloride flush  3 mL Intravenous Q12H   Continuous Infusions:   LOS: 6 days        Kaitland Lewellyn Gerome Apley, MD

## 2019-12-01 LAB — COMPREHENSIVE METABOLIC PANEL
ALT: 38 U/L (ref 0–44)
AST: 24 U/L (ref 15–41)
Albumin: 3 g/dL — ABNORMAL LOW (ref 3.5–5.0)
Alkaline Phosphatase: 59 U/L (ref 38–126)
Anion gap: 10 (ref 5–15)
BUN: 42 mg/dL — ABNORMAL HIGH (ref 8–23)
CO2: 23 mmol/L (ref 22–32)
Calcium: 8.9 mg/dL (ref 8.9–10.3)
Chloride: 101 mmol/L (ref 98–111)
Creatinine, Ser: 1.29 mg/dL — ABNORMAL HIGH (ref 0.61–1.24)
GFR calc Af Amer: >60 mL/min (ref >60)
GFR calc non Af Amer: 57 mL/min — ABNORMAL LOW (ref >60)
Glucose, Bld: 132 mg/dL — ABNORMAL HIGH (ref 70–99)
Potassium: 6 mmol/L — ABNORMAL HIGH (ref 3.5–5.1)
Sodium: 134 mmol/L — ABNORMAL LOW (ref 135–145)
Total Bilirubin: 0.7 mg/dL (ref 0.3–1.2)
Total Protein: 6.4 g/dL — ABNORMAL LOW (ref 6.5–8.1)

## 2019-12-01 LAB — GLUCOSE, CAPILLARY
Glucose-Capillary: 121 mg/dL — ABNORMAL HIGH (ref 70–99)
Glucose-Capillary: 142 mg/dL — ABNORMAL HIGH (ref 70–99)
Glucose-Capillary: 132 mg/dL — ABNORMAL HIGH (ref 70–99)
Glucose-Capillary: 135 mg/dL — ABNORMAL HIGH (ref 70–99)

## 2019-12-01 LAB — FERRITIN: Ferritin: 717 ng/mL — ABNORMAL HIGH (ref 24–336)

## 2019-12-01 LAB — C-REACTIVE PROTEIN: CRP: 1 mg/dL — ABNORMAL HIGH (ref <1.0)

## 2019-12-01 LAB — D-DIMER, QUANTITATIVE: D-Dimer, Quant: 0.67 ug/mL-FEU — ABNORMAL HIGH (ref 0.00–0.50)

## 2019-12-01 MED ORDER — SODIUM ZIRCONIUM CYCLOSILICATE 10 G PO PACK
10.0000 g | PACK | Freq: Three times a day (TID) | ORAL | Status: AC
  Administered 2019-12-01 (×3): 10 g via ORAL
  Filled 2019-12-01 (×3): qty 1

## 2019-12-01 NOTE — Progress Notes (Signed)
PROGRESS NOTE    Ronald Olsen  FBP:[id number] DOB: 1950-07-16 DOA: 11/17/2019 PCP: Pcp, No    Brief Narrative:  Patient admitted to the hospital with working diagnosis of acute hypoxic respiratory failure due to SARS COVID-19 viral pneumonia.  69 year old male with a past medical history for hypertension, dyslipidemia who presents with dyspnea, malaise, fatigue and cough for about 10 days. Acute worsening of his symptoms over the last 72 hours prior to hospitalization. Multiple sick contacts at home, he has not been vaccinated. Due to severe symptoms he called EMS, his oxygen saturation was in the 70s and he was placed on a 10 L nonrebreather mask and transported to the hospital. On his initial physical examination he was afebrile, but tachycardic and tachypneic, requiring 6 L/min of supplemental oxygen per nasal cannula. His lungs had no wheezing but positive tachypnea, heart S1-S2, present, tachycardic, abdomen soft, no lower extremity edema.  Chest radiograph with bilateral infiltrates at the lower lobes, peripherally, more left than right.  Patient with persistent high oxygen requirements per HFNC, added furosemide x1 for non cardiogenic pulmonary edema.  Worsening oxygen requirements, now on 15l/min per HFNC plus NRBM, with desaturation with movement.    Assessment & Plan:   Principal Problem:   Acute hypoxemic respiratory failure due to COVID-19 [facility]) Active Problems:   CAD (coronary artery disease)   Essential hypertension   Hyponatremia   Hyperlipidemia   1.Acute hypoxic respiratory failure due to SARS COVID-19 viral pneumonia.Sp Remdesivir #5/5  RR: 16  Pulse oxymetry: 88%  Fi02: 15 L/min HFNC plus NRBM  COVID-19 Labs  Recent Labs    11/29/19 0558 11/30/19 1133 12/01/19 0440  DDIMER 0.59* 0.63* 0.67*  FERRITIN 727* 662* 717*  CRP 5.8* 1.9* 1.0*    Lab Results  Component Value Date   SARSCOV2NAA POSITIVE (A) 11/21/2019   Patient with  persistent high oxygen requirements, unable to wean of NRBM, oxygen desaturation down to 78%. Inflammatory markers, continue to be elevated but slightly trending down.   On aggressive medical therapy with baricitinib and high dose systemic steroids with methylprednisolone. Continue with bronchodilator therapy, antitussive agents and airway clearing techniques.  Patient very weak and deconditioned.   Continue to follow with inflammatory markers, out of bed to chair tid with meals, not able to prone due to generalized weakness. Consult PT and OT.    2. HTN/ CAD.Continue with dual antiplatelet therapy with asa and clopidogrel.Continue to hold on blood pressure medications.  3. AKI on CKD stage 2/ Hyponatremia/ non anion gap metabolic acidosis/hyperkalemia.  renal function with serum cr at 1,29, K up to 6,0 and bicarbonate at 23. Add three doses of 10 mg sodium zirconium and follow up on renal function in am. Hold on IV fluids for now. Patient is tolerating po well.   4. Controlled T2DM/ dyslipidemia.Fasting glucose is 132, continue with insulin sliding scale for glucose cover and monitoring. On atorvastatin.   5. Anxiety. On alprazolam as needed for anxiety.   Patient continue to be at high risk for worsening respiratory failure   Status is: Inpatient  Remains inpatient appropriate because:IV treatments appropriate due to intensity of illness or inability to take PO   Dispo:  Patient From: Home  Planned Disposition: Home with Health Care Svc  Expected discharge date: 12/08/19  Medically stable for discharge: No    DVT prophylaxis: Enoxaparin   Code Status:   full  Family Communication:   I spoke over the phone with the patient's wife about patient's  condition, plan of care, prognosis and all questions were addressed.    Subjective: Patient continue to be very weak and deconditioned, positive oxygen desaturation with movement, no nausea or vomiting.    Objective: Vitals:   11/30/19 2002 11/30/19 2023 12/01/19 0527 12/01/19 0816  BP:  122/84 (!) 96/55   Pulse:  95    Resp:  16 16   Temp:  98.3 F (36.8 C) 98.5 F (36.9 C)   TempSrc:  Oral Axillary   SpO2: 94% (!) 88% (!) 88% 94%  Weight:      Height:        Intake/Output Summary (Last 24 hours) at 12/01/2019 0937 Last data filed at 12/01/2019 0528 Gross per 24 hour  Intake 1260 ml  Output 1150 ml  Net 110 ml   Filed Weights   11/13/2019 1418  Weight: 99.8 kg    Examination:   General: Not in pain, positive dyspnea at rest, deconditioned and ill looking appearing.  Neurology: Awake and alert, non focal  E ENT: no pallor, no icterus, oral mucosa moist Cardiovascular: No JVD. S1-S2 present, rhythmic, no gallops, rubs, or murmurs. No lower extremity edema. Pulmonary: positive breath sounds bilaterally, bilateral rales.  Gastrointestinal. Abdomen soft and non tender Skin. No rashes Musculoskeletal: no joint deformities     Data Reviewed: I have personally reviewed following labs and imaging studies  CBC: Recent Labs  Lab 12/04/2019 1051 11/25/19 0559 11/26/19 0406  WBC 9.8 8.0 13.6*  NEUTROABS 7.8* 5.7 10.6*  HGB 14.6 13.2 13.4  HCT 43.0 40.5 40.0  MCV 91.7 94.0 92.2  PLT 196 209 062   Basic Metabolic Panel: Recent Labs  Lab 11/25/19 0559 11/25/19 0559 11/26/19 0406 11/26/19 0406 11/27/19 0418 11/28/19 0401 11/29/19 0558 11/30/19 1133 12/01/19 0440  NA 136   < > 132*   < > 136 133* 135 133* 134*  K 4.9   < > 4.5   < > 5.0 5.1 5.6* 4.9 6.0*  CL 106   < > 106   < > 107 103 103 102 101  CO2 21*   < > 16*   < > 19* 19* 24 20* 23  GLUCOSE 125*   < > 123*   < > 115* 122* 110* 174* 132*  BUN 33*   < > 42*   < > 44* 50* 48* 44* 42*  CREATININE 1.24   < > 1.25*   < > 1.24 1.33* 1.25* 1.05 1.29*  CALCIUM 8.4*   < > 8.2*   < > 8.6* 8.8* 8.9 8.9 8.9  MG 2.8*  --  2.8*  --   --   --   --   --   --    < > = values in this interval not displayed.    GFR: Estimated Creatinine Clearance: 66.9 mL/min (A) (by C-G formula based on SCr of 1.29 mg/dL (H)). Liver Function Tests: Recent Labs  Lab 11/26/19 0406 11/27/19 0418 11/28/19 0401 11/29/19 0558 12/01/19 0440  AST 44* 47* 44* 35 24  ALT 45* 45* 52* 50* 38  ALKPHOS 52 59 66 66 59  BILITOT 0.8 1.0 1.0 0.9 0.7  PROT 6.3* 6.4* 6.7 6.9 6.4*  ALBUMIN 3.3* 3.3* 3.5 3.4* 3.0*   No results for input(s): LIPASE, AMYLASE in the last 168 hours. No results for input(s): AMMONIA in the last 168 hours. Coagulation Profile: No results for input(s): INR, PROTIME in the last 168 hours. Cardiac Enzymes: No results for input(s): CKTOTAL, CKMB, CKMBINDEX,  TROPONINI in the last 168 hours. BNP (last 3 results) No results for input(s): PROBNP in the last 8760 hours. HbA1C: No results for input(s): HGBA1C in the last 72 hours. CBG: Recent Labs  Lab 11/30/19 0801 11/30/19 1206 11/30/19 1647 11/30/19 2026 12/01/19 0801  GLUCAP 133* 177* 178* 123* 121*   Lipid Profile: No results for input(s): CHOL, HDL, LDLCALC, TRIG, CHOLHDL, LDLDIRECT in the last 72 hours. Thyroid Function Tests: No results for input(s): TSH, T4TOTAL, FREET4, T3FREE, THYROIDAB in the last 72 hours. Anemia Panel: Recent Labs    11/30/19 1133 12/01/19 0440  FERRITIN 662* 717*      Radiology Studies: I have reviewed all of the imaging during this hospital visit personally     Scheduled Meds: . alfuzosin  10 mg Oral Q breakfast  . aspirin EC  81 mg Oral Daily  . atorvastatin  40 mg Oral Daily  . baricitinib  4 mg Oral Daily  . chlorpheniramine-HYDROcodone  5 mL Oral Q12H  . clopidogrel  75 mg Oral Daily  . cyclobenzaprine  10 mg Oral BID  . finasteride  5 mg Oral Daily  . insulin aspart  0-5 Units Subcutaneous QHS  . insulin aspart  0-9 Units Subcutaneous TID WC  . Ipratropium-Albuterol  1 puff Inhalation QID  . methylPREDNISolone (SOLU-MEDROL) injection  60 mg Intravenous Q8H  . pantoprazole  40 mg  Oral Daily  . sodium chloride flush  3 mL Intravenous Q12H   Continuous Infusions:   LOS: 7 days        Calloway Andrus Gerome Apley, MD

## 2019-12-02 LAB — COMPREHENSIVE METABOLIC PANEL
ALT: 38 U/L (ref 0–44)
AST: 24 U/L (ref 15–41)
Albumin: 3 g/dL — ABNORMAL LOW (ref 3.5–5.0)
Alkaline Phosphatase: 57 U/L (ref 38–126)
Anion gap: 8 (ref 5–15)
BUN: 48 mg/dL — ABNORMAL HIGH (ref 8–23)
CO2: 24 mmol/L (ref 22–32)
Calcium: 8.8 mg/dL — ABNORMAL LOW (ref 8.9–10.3)
Chloride: 101 mmol/L (ref 98–111)
Creatinine, Ser: 1.24 mg/dL (ref 0.61–1.24)
GFR calc Af Amer: >60 mL/min (ref >60)
GFR calc non Af Amer: 60 mL/min — ABNORMAL LOW (ref >60)
Glucose, Bld: 134 mg/dL — ABNORMAL HIGH (ref 70–99)
Potassium: 5.2 mmol/L — ABNORMAL HIGH (ref 3.5–5.1)
Sodium: 133 mmol/L — ABNORMAL LOW (ref 135–145)
Total Bilirubin: 0.6 mg/dL (ref 0.3–1.2)
Total Protein: 6.2 g/dL — ABNORMAL LOW (ref 6.5–8.1)

## 2019-12-02 LAB — D-DIMER, QUANTITATIVE: D-Dimer, Quant: 0.83 ug/mL-FEU — ABNORMAL HIGH (ref 0.00–0.50)

## 2019-12-02 LAB — GLUCOSE, CAPILLARY
Glucose-Capillary: 114 mg/dL — ABNORMAL HIGH (ref 70–99)
Glucose-Capillary: 173 mg/dL — ABNORMAL HIGH (ref 70–99)
Glucose-Capillary: 212 mg/dL — ABNORMAL HIGH (ref 70–99)
Glucose-Capillary: 149 mg/dL — ABNORMAL HIGH (ref 70–99)

## 2019-12-02 LAB — C-REACTIVE PROTEIN: CRP: 0.7 mg/dL (ref <1.0)

## 2019-12-02 LAB — FERRITIN: Ferritin: 664 ng/mL — ABNORMAL HIGH (ref 24–336)

## 2019-12-02 MED ORDER — METHYLPREDNISOLONE SODIUM SUCC 125 MG IJ SOLR
60.0000 mg | Freq: Two times a day (BID) | INTRAMUSCULAR | Status: DC
  Administered 2019-12-03 – 2019-12-07 (×8): 60 mg via INTRAVENOUS
  Filled 2019-12-02 (×8): qty 2

## 2019-12-02 MED ORDER — ENSURE ENLIVE PO LIQD
237.0000 mL | Freq: Two times a day (BID) | ORAL | Status: DC
  Administered 2019-12-02 – 2019-12-09 (×11): 237 mL via ORAL

## 2019-12-02 NOTE — Plan of Care (Signed)
Plan of care reviewed and discussed with the patient. 

## 2019-12-02 NOTE — Progress Notes (Signed)
Occupational Therapy Evaluation  Patient with functional deficits listed below impacting safety and independence with self care. Patient on 15L HFNC and NRB, desat to 82% with min G assist to recliner chair for safety due to mild unsteadiness. Cue PLB and recover to 87% within ~84min. Educate patient in ther ex with green theraband completed x10 reps and min cues for recovery breaks as patient fluctuates between 82-87%. Recommend continued acute OT services to maximize patient activity tolerance, balance, safety and further educate regarding breathing techniques and energy conservation strategies in order to D/C to venue listed below.    12/02/19 1421  OT Visit Information  Last OT Received On 12/02/19  Assistance Needed +1  History of Present Illness Patient is a 69 year old male past medical history for hypertension, dyslipidemia who presents with dyspnea, malaise, fatigue and cough for about 10 days. Patient diagnosed with COVID  Precautions  Precautions Fall  Precaution Comments monitor vitals  Restrictions  Weight Bearing Restrictions No  Home Living  Family/patient expects to be discharged to: Private residence  Living Arrangements Spouse/significant other  Available Help at Discharge Family  Type of [place] to enter  Entrance Stairs-Number of Steps 8  [place] Two level;Other (Comment) (mother in law lives in basement)  Bathroom Shower/Tub Walk-in shower  [place] height  Home Equipment None  Prior Function  Level of Independence Independent  Communication  Communication No difficulties  Pain Assessment  Pain Assessment No/denies pain  Cognition  Arousal/Alertness Awake/alert  Behavior During Therapy WFL for tasks assessed/performed  Overall Cognitive Status Within Functional Limits for tasks assessed  Upper Extremity Assessment  Upper Extremity Assessment Generalized weakness  Lower Extremity  Assessment  Lower Extremity Assessment Defer to PT evaluation  Cervical / Trunk Assessment  Cervical / Trunk Assessment Normal  ADL  Overall ADL's  Needs assistance/impaired  Grooming Set up;Sitting  Upper Body Bathing Set up;Sitting  Lower Body Bathing Min guard;Sitting/lateral leans;Sit to/from stand  Upper Body Dressing  Set up;Sitting  Lower Body Dressing Min guard;Sitting/lateral leans;Sit to/from Chiropractor Details (indicate cue type and reason) to recliner, min G for safety as patient mildly unsteady  Actuary Min guard;Sit to/from stand  Functional mobility during ADLs Min guard  Bed Mobility  Overal bed mobility Modified Independent  Transfers  Overall transfer level Needs assistance  Equipment used None  Transfers Sit to/from Stand;Stand Pivot Transfers  Sit to Stand Supervision  Stand pivot transfers Min guard  General transfer comment no physical assist to power up to standing, min G for transfer to recliner due to mild unsteadiness  Balance  Overall balance assessment Mild deficits observed, not formally tested  General Comments  General comments (skin integrity, edema, etc.) pt on NRB 15L HFNC desat to 82% with transfer with mod cues for pursed lip breathing and ~66min recovery. Patient fluctuate between 82-87% with seated ther ex with min cues for recovery. patient in high 80s at end of session with RN aware  Exercises  Exercises Other exercises  Other Exercises  Other Exercises elbow flexion + extension, diagonal pulls, shoulder flexion, horizontal abduction and shoulder abduction x10 reps green theraband. educate patient in pursed lip breathing techniques   OT - End of Session  Equipment Utilized During Treatment Oxygen  Activity Tolerance Patient tolerated treatment well  Patient left in chair;with call bell/phone within reach  Nurse Communication Other (comment) (vitals)  OT  Assessment  OT Recommendation/Assessment Patient needs continued OT Services  OT Visit Diagnosis Muscle weakness (generalized) (M62.81);Other abnormalities of gait and mobility (R26.89)  OT Problem List Decreased strength;Decreased activity tolerance;Impaired balance (sitting and/or standing);Cardiopulmonary status limiting activity  OT Plan  OT Frequency (ACUTE ONLY) Min 2X/week  OT Treatment/Interventions (ACUTE ONLY) Self-care/ADL training;Therapeutic exercise;Energy conservation;Patient/family education;Therapeutic activities;Balance training  AM-PAC OT "6 Clicks" Daily Activity Outcome Measure (Version 2)  Help from another person eating meals? 4  Help from another person taking care of personal grooming? 3  Help from another person toileting, which includes using toliet, bedpan, or urinal? 3  Help from another person bathing (including washing, rinsing, drying)? 3  Help from another person to put on and taking off regular upper body clothing? 3  Help from another person to put on and taking off regular lower body clothing? 3  6 Click Score 19  OT Recommendation  Follow Up Recommendations No OT follow up  OT Equipment Tub/shower seat  Individuals Consulted  Consulted and Agree with Results and Recommendations Patient  Acute Rehab OT Goals  Patient Stated Goal go home  OT Goal Formulation With patient  Time For Goal Achievement 12/16/19  Potential to Achieve Goals Good  OT Time Calculation  OT Start Time (ACUTE ONLY) 0957  OT Stop Time (ACUTE ONLY) 1024  OT Time Calculation (min) 27 min  OT General Charges  $OT Visit 1 Visit  OT Evaluation  $OT Eval Low Complexity 1 Low  OT Treatments  $Therapeutic Exercise 8-22 mins  Written Expression  Dominant Hand Right   Delbert Phenix OT OT pager: [phone]

## 2019-12-02 NOTE — Progress Notes (Signed)
Upon entering room patients 02 sat was 84 % on 13 LPM NRB and HFNC, bumped 02 to 15 LPM sats now @ 90%. No distress noted. Patient is deep breathing. Will continue to monitor.

## 2019-12-02 NOTE — Care Management Important Message (Signed)
Important Message  Patient Details IM Letter given to the Patient Name: Ronald Olsen MRN: [id number] Date of Birth: October 23, 1950   Medicare Important Message Given:  Yes     Kerin Salen 12/02/2019, 10:30 AM

## 2019-12-02 NOTE — Progress Notes (Signed)
PROGRESS NOTE    Ronald Olsen  JTT:[id number] DOB: [birth year] DOA: 11/16/2019 PCP: Pcp, No    Brief Narrative:  Patient admitted to the hospital with working diagnosis of acute hypoxic respiratory failure due to SARS COVID-19 viral pneumonia.  69 year old male with a past medical history for hypertension, dyslipidemia who presents with dyspnea, malaise, fatigue and cough for about 10 days. Acute worsening of his symptoms over the last 72 hours prior to hospitalization. Multiple sick contacts at home, he has not been vaccinated. Due to severe symptoms he called EMS, his oxygen saturation was in the 70s and he was placed on a 10 L nonrebreather mask and transported to the hospital. On his initial physical examination he was afebrile, but tachycardic and tachypneic, requiring 6 L/min of supplemental oxygen per nasal cannula. His lungs had no wheezing but positive tachypnea, heart S1-S2, present, tachycardic, abdomen soft, no lower extremity edema.  Chest radiograph with bilateral infiltrates at the lower lobes, peripherally, more left than right.  He has been placed on aggressive medical therapy with high dose systemic steroids, IV remdesivir and oral baricitinib.   Patient with persistent high oxygen requirements despite diuresis, for suspected non cardiogenic pulmonary edema.  Worsening oxygen requirements, up to 15 L/min per HFNC plus NRBM. Rapid oxygen desaturation with minimal movements.   Assessment & Plan:   Principal Problem:   Acute hypoxemic respiratory failure due to COVID-19 [facility] Active Problems:   CAD (coronary artery disease)   Essential hypertension   Hyponatremia   Hyperlipidemia   1.Acute hypoxic respiratory failure due to SARS COVID-19 viral pneumonia.Sp Remdesivir #5/5  RR: 20 Pulse oxymetry: 90 % Fi02: 15 L/min plus NRBM  COVID-19 Labs  Recent Labs    11/30/19 1133 12/01/19 0440 12/02/19 0344  DDIMER 0.63* 0.67* 0.83*  FERRITIN 662*  717* 664*  CRP 1.9* 1.0* 0.7    Lab Results  Component Value Date   SARSCOV2NAA POSITIVE (A) 11/07/2019    Inflammatory markers slowly trending down, but continue to be elevated. Unable to decrease Fi02 due to persistent hypoxemia.   Continue with baricitinib #9/14 , decrease steroids to 60 mg IV and continue with bronchodilator therapy, antitussive agents along with airway clearing techniques.   Encourage to get out of bed to chair tid with meals, has been difficult to stay prone, but has been able to stay lateral decubitus. Continue PT and OT.   Follow with inflammatory markers, keep oxygen saturation more than 82%. Monitor for mentation and work of breathing.   2. HTN/ CAD.On dual antiplatelet therapy with asa and clopidogrel.Blood pressure has been controlled.  3. AKI on CKD stage 2/ Hyponatremia/ non anion gap metabolic acidosis/hyperkalemia. K down to 5,2 after sodium zirconium, renal function with stable serum cr at 1,24 with bicarbonate at 24.  Continue to follow up renal function and electrolytes, patient is tolerating po well.    4. Controlled T2DM/ dyslipidemia.On insulin sliding scale for glucose cover and monitoring. His glucose has been controlled.  Continue with atorvastatin.   5. Anxiety. Tolerating well alprazolam as needed for anxiety.  Patient continue to be at high risk for worsening hypoxemic respiratory failure.   Status is: Inpatient  Remains inpatient appropriate because:IV treatments appropriate due to intensity of illness or inability to take PO   Dispo:  Patient From: Home  Planned Disposition: Home with Health Care Svc  Expected discharge date: 12/08/19  Medically stable for discharge: No    DVT prophylaxis: Enoxaparin   Code Status:   full  Family Communication:  I spoke over the phone with the patient's wife about patient's  condition, plan of care, prognosis and all questions were addressed.    Nutrition Status: Nutrition  Problem: Increased nutrient needs Etiology: acute illness (COVID-19 infection) Signs/Symptoms: estimated needs Interventions: Ensure Enlive (each supplement provides 350kcal and 20 grams of protein)     Subjective: Patient continue to be very weak and deconditioned, this am out of bed to chair. Dyspnea seems to be stable, continue with high flow nasal cannula and non rebreather mask/   Objective: Vitals:   12/01/19 2118 12/02/19 0310 12/02/19 0408 12/02/19 0854  BP:   114/75   Pulse:   91   Resp:   20   Temp:   98.2 F (36.8 C)   TempSrc:      SpO2: 94% 90% (!) 87% 90%  Weight:      Height:        Intake/Output Summary (Last 24 hours) at 12/02/2019 1032 Last data filed at 12/02/2019 0854 Gross per 24 hour  Intake 360 ml  Output 2050 ml  Net -1690 ml   Filed Weights   12/01/2019 1418  Weight: 99.8 kg    Examination:   General: Not in pain. Positive dyspnea at rest.  Neurology: Awake and alert, non focal  E ENT: no pallor, no icterus, oral mucosa moist Cardiovascular: No JVD. S1-S2 present, rhythmic, no gallops, rubs, or murmurs. No lower extremity edema. Pulmonary: positive breath sounds bilaterally, no rhonchi but bilateral rales. Gastrointestinal. Abdomen soft and non tender Skin. No rashes Musculoskeletal: no joint deformities     Data Reviewed: I have personally reviewed following labs and imaging studies  CBC: Recent Labs  Lab 11/26/19 0406  WBC 13.6*  NEUTROABS 10.6*  HGB 13.4  HCT 40.0  MCV 92.2  PLT 500   Basic Metabolic Panel: Recent Labs  Lab 11/26/19 0406 11/27/19 0418 11/28/19 0401 11/29/19 0558 11/30/19 1133 12/01/19 0440 12/02/19 0344  NA 132*   < > 133* 135 133* 134* 133*  K 4.5   < > 5.1 5.6* 4.9 6.0* 5.2*  CL 106   < > 103 103 102 101 101  CO2 16*   < > 19* 24 20* 23 24  GLUCOSE 123*   < > 122* 110* 174* 132* 134*  BUN 42*   < > 50* 48* 44* 42* 48*  CREATININE 1.25*   < > 1.33* 1.25* 1.05 1.29* 1.24  CALCIUM 8.2*   < > 8.8*  8.9 8.9 8.9 8.8*  MG 2.8*  --   --   --   --   --   --    < > = values in this interval not displayed.   GFR: Estimated Creatinine Clearance: 69.6 mL/min (by C-G formula based on SCr of 1.24 mg/dL). Liver Function Tests: Recent Labs  Lab 11/27/19 0418 11/28/19 0401 11/29/19 0558 12/01/19 0440 12/02/19 0344  AST 47* 44* 35 24 24  ALT 45* 52* 50* 38 38  ALKPHOS 59 66 66 59 57  BILITOT 1.0 1.0 0.9 0.7 0.6  PROT 6.4* 6.7 6.9 6.4* 6.2*  ALBUMIN 3.3* 3.5 3.4* 3.0* 3.0*   No results for input(s): LIPASE, AMYLASE in the last 168 hours. No results for input(s): AMMONIA in the last 168 hours. Coagulation Profile: No results for input(s): INR, PROTIME in the last 168 hours. Cardiac Enzymes: No results for input(s): CKTOTAL, CKMB, CKMBINDEX, TROPONINI in the last 168 hours. BNP (last 3 results) No results for input(s): PROBNP  in the last 8760 hours. HbA1C: No results for input(s): HGBA1C in the last 72 hours. CBG: Recent Labs  Lab 12/01/19 0801 12/01/19 1108 12/01/19 1642 12/01/19 2107 12/02/19 0722  GLUCAP 121* 142* 132* 135* 114*   Lipid Profile: No results for input(s): CHOL, HDL, LDLCALC, TRIG, CHOLHDL, LDLDIRECT in the last 72 hours. Thyroid Function Tests: No results for input(s): TSH, T4TOTAL, FREET4, T3FREE, THYROIDAB in the last 72 hours. Anemia Panel: Recent Labs    12/01/19 0440 12/02/19 0344  FERRITIN 717* 664*      Radiology Studies: I have reviewed all of the imaging during this hospital visit personally     Scheduled Meds: . alfuzosin  10 mg Oral Q breakfast  . aspirin EC  81 mg Oral Daily  . atorvastatin  40 mg Oral Daily  . baricitinib  4 mg Oral Daily  . chlorpheniramine-HYDROcodone  5 mL Oral Q12H  . clopidogrel  75 mg Oral Daily  . cyclobenzaprine  10 mg Oral BID  . feeding supplement (ENSURE ENLIVE)  237 mL Oral BID BM  . finasteride  5 mg Oral Daily  . insulin aspart  0-5 Units Subcutaneous QHS  . insulin aspart  0-9 Units Subcutaneous  TID WC  . Ipratropium-Albuterol  1 puff Inhalation QID  . methylPREDNISolone (SOLU-MEDROL) injection  60 mg Intravenous Q8H  . pantoprazole  40 mg Oral Daily  . sodium chloride flush  3 mL Intravenous Q12H   Continuous Infusions:   LOS: 8 days        Kallin Henk Gerome Apley, MD

## 2019-12-02 NOTE — Progress Notes (Signed)
°   12/02/19 1800  Assess: MEWS Score  Temp 97.8 F (36.6 C)  BP 121/79  Pulse Rate (!) 104  Resp 20  SpO2 (!) 88 %  O2 Device Partial Rebreather Mask;HFNC  O2 Flow Rate (L/min) 13 L/min  Assess: MEWS Score  MEWS Temp 0  MEWS Systolic 0  MEWS Pulse 1  MEWS RR 0  MEWS LOC 0  MEWS Score 1  MEWS Score Color Green  Assess: if the MEWS score is Yellow or Red  Were vital signs taken at a resting state? Yes  Focused Assessment No change from prior assessment  Early Detection of Sepsis Score *See Row Information* Low  MEWS guidelines implemented *See Row Information* No, previously yellow, continue vital signs every 4 hours  Document  Patient Outcome Stabilized after interventions  Progress note created (see row info) Yes

## 2019-12-02 NOTE — Progress Notes (Signed)
PT Cancellation Note  Patient Details Name: Ronald Olsen MRN: [id number] DOB: Oct 31, 1950   Cancelled Treatment:    Reason Eval/Treat Not Completed: Medical issues which prohibited therapy (RN requested PT be held due to pt's poor respiratory status. Will follow.)  Philomena Doheny PT 12/02/2019  Acute Rehabilitation Services Pager [phone] Office [phone]

## 2019-12-02 NOTE — Progress Notes (Signed)
Initial Nutrition Assessment  DOCUMENTATION CODES:   Obesity unspecified  INTERVENTION:   -Ensure Enlive po BID, each supplement provides 350 kcal and 20 grams of protein  NUTRITION DIAGNOSIS:   Increased nutrient needs related to acute illness (COVID-19 infection) as evidenced by estimated needs.  GOAL:   Patient will meet greater than or equal to 90% of their needs  MONITOR:   PO intake, Supplement acceptance, Labs, Weight trends, I & O's  REASON FOR ASSESSMENT:   Consult Assessment of nutrition requirement/status  ASSESSMENT:   69 year old male with a past medical history for hypertension, dyslipidemia who presents with dyspnea, malaise, fatigue and cough for about 10 days.  Acute worsening of his symptoms over the last 72 hours prior to hospitalization.  Multiple sick contacts at home, he has not been vaccinated.Patient admitted to the hospital with working diagnosis of acute hypoxic respiratory failure due to SARS COVID-19 viral pneumonia.  Patient has been positive for COVID-19 since 9/20.   Patient currently consuming 25-75% of meals. Denies any nausea or vomiting.  Given increased needs from activ COVID-19 infection, will order Ensure supplements.  Per weight records, pt's weight is trending up.  Medications reviewed.  Labs reviewed: CBGs: 114-135 Low Na Elevated K  NUTRITION - FOCUSED PHYSICAL EXAM:  Unable to complete  Diet Order:   Diet Order            Diet heart healthy/carb modified Room service appropriate? Yes; Fluid consistency: Thin  Diet effective now                 EDUCATION NEEDS:   No education needs have been identified at this time  Skin:  Skin Assessment: Reviewed RN Assessment  Last BM:  9/24  Height:   Ht Readings from Last 1 Encounters:  11/25/2019 5\' 11"  (1.803 m)    Weight:   Wt Readings from Last 1 Encounters:  11/30/2019 99.8 kg    BMI:  Body mass index is 30.68 kg/m.  Estimated Nutritional Needs:   Kcal:   2300-2500  Protein:  100-115g  Fluid:  2L/day  Clayton Bibles, MS, RD, LDN Inpatient Clinical Dietitian Contact information available via Amion

## 2019-12-02 NOTE — Progress Notes (Signed)
   12/02/19 1549  Assess: MEWS Score  Temp 98.1 F (36.7 C)  BP 124/81  Pulse Rate 94  Resp 20  SpO2 95 %  Assess: MEWS Score  MEWS Temp 0  MEWS Systolic 0  MEWS Pulse 0  MEWS RR 0  MEWS LOC 0  MEWS Score 0  MEWS Score Color Green  Assess: if the MEWS score is Yellow or Red  Were vital signs taken at a resting state? Yes  Focused Assessment No change from prior assessment  Treat  Pain Scale 0-10  Pain Score 0  Document  Patient Outcome Stabilized after interventions  Progress note created (see row info) Yes

## 2019-12-03 ENCOUNTER — Inpatient Hospital Stay (HOSPITAL_COMMUNITY): Payer: Medicare Other

## 2019-12-03 LAB — D-DIMER, QUANTITATIVE: D-Dimer, Quant: 1.55 ug/mL-FEU — ABNORMAL HIGH (ref 0.00–0.50)

## 2019-12-03 LAB — GLUCOSE, CAPILLARY
Glucose-Capillary: 78 mg/dL (ref 70–99)
Glucose-Capillary: 131 mg/dL — ABNORMAL HIGH (ref 70–99)
Glucose-Capillary: 115 mg/dL — ABNORMAL HIGH (ref 70–99)
Glucose-Capillary: 175 mg/dL — ABNORMAL HIGH (ref 70–99)

## 2019-12-03 LAB — CBC WITH DIFFERENTIAL/PLATELET
Abs Immature Granulocytes: 1.88 10*3/uL — ABNORMAL HIGH (ref 0.00–0.07)
Basophils Absolute: 0.2 10*3/uL — ABNORMAL HIGH (ref 0.0–0.1)
Basophils Relative: 1 %
Eosinophils Absolute: 0.1 10*3/uL (ref 0.0–0.5)
Eosinophils Relative: 0 %
HCT: 43.2 % (ref 39.0–52.0)
Hemoglobin: 14.8 g/dL (ref 13.0–17.0)
Immature Granulocytes: 7 %
Lymphocytes Relative: 5 %
Lymphs Abs: 1.5 10*3/uL (ref 0.7–4.0)
MCH: 31.1 pg (ref 26.0–34.0)
MCHC: 34.3 g/dL (ref 30.0–36.0)
MCV: 90.8 fL (ref 80.0–100.0)
Monocytes Absolute: 1 10*3/uL (ref 0.1–1.0)
Monocytes Relative: 4 %
Neutro Abs: 23.9 10*3/uL — ABNORMAL HIGH (ref 1.7–7.7)
Neutrophils Relative %: 83 %
Platelets: 400 10*3/uL (ref 150–400)
RBC: 4.76 MIL/uL (ref 4.22–5.81)
RDW: 12.2 % (ref 11.5–15.5)
WBC: 28.6 10*3/uL — ABNORMAL HIGH (ref 4.0–10.5)
nRBC: 0 % (ref 0.0–0.2)

## 2019-12-03 LAB — COMPREHENSIVE METABOLIC PANEL
ALT: 41 U/L (ref 0–44)
AST: 26 U/L (ref 15–41)
Albumin: 3 g/dL — ABNORMAL LOW (ref 3.5–5.0)
Alkaline Phosphatase: 61 U/L (ref 38–126)
Anion gap: 8 (ref 5–15)
BUN: 45 mg/dL — ABNORMAL HIGH (ref 8–23)
CO2: 26 mmol/L (ref 22–32)
Calcium: 8.7 mg/dL — ABNORMAL LOW (ref 8.9–10.3)
Chloride: 100 mmol/L (ref 98–111)
Creatinine, Ser: 1.19 mg/dL (ref 0.61–1.24)
GFR calc Af Amer: >60 mL/min (ref >60)
GFR calc non Af Amer: >60 mL/min (ref >60)
Glucose, Bld: 95 mg/dL (ref 70–99)
Potassium: 4.8 mmol/L (ref 3.5–5.1)
Sodium: 134 mmol/L — ABNORMAL LOW (ref 135–145)
Total Bilirubin: 0.8 mg/dL (ref 0.3–1.2)
Total Protein: 6.2 g/dL — ABNORMAL LOW (ref 6.5–8.1)

## 2019-12-03 LAB — C-REACTIVE PROTEIN: CRP: 0.6 mg/dL (ref <1.0)

## 2019-12-03 LAB — FERRITIN: Ferritin: 740 ng/mL — ABNORMAL HIGH (ref 24–336)

## 2019-12-03 MED ORDER — FUROSEMIDE 10 MG/ML IJ SOLN
40.0000 mg | Freq: Once | INTRAMUSCULAR | Status: AC
  Administered 2019-12-03: 40 mg via INTRAVENOUS
  Filled 2019-12-03: qty 4

## 2019-12-03 MED ORDER — IOHEXOL 350 MG/ML SOLN
100.0000 mL | Freq: Once | INTRAVENOUS | Status: AC | PRN
  Administered 2019-12-03: 100 mL via INTRAVENOUS

## 2019-12-03 MED ORDER — ENOXAPARIN SODIUM 40 MG/0.4ML ~~LOC~~ SOLN
40.0000 mg | SUBCUTANEOUS | Status: DC
  Administered 2019-12-03 – 2019-12-08 (×6): 40 mg via SUBCUTANEOUS
  Filled 2019-12-03 (×6): qty 0.4

## 2019-12-03 NOTE — Progress Notes (Signed)
   12/03/19 2130  Assess: MEWS Score  Temp 97.9 F (36.6 C)  BP (!) 99/57  Pulse Rate (!) 110  Resp 17  Level of Consciousness Alert  SpO2 96 %  O2 Device HFNC;Non-rebreather Mask  O2 Flow Rate (L/min) 13 L/min  Assess: MEWS Score  MEWS Temp 0  MEWS Systolic 1  MEWS Pulse 1  MEWS RR 0  MEWS LOC 0  MEWS Score 2  MEWS Score Color Yellow  Assess: if the MEWS score is Yellow or Red  Were vital signs taken at a resting state? Yes  Focused Assessment No change from prior assessment  Early Detection of Sepsis Score *See Row Information* Medium  MEWS guidelines implemented *See Row Information* No, previously yellow, continue vital signs every 4 hours  Treat  MEWS Interventions Administered scheduled meds/treatments  Pain Scale 0-10  Pain Score 0  Take Vital Signs  Increase Vital Sign Frequency  Yellow: Q 2hr X 2 then Q 4hr X 2, if remains yellow, continue Q 4hrs  Escalate  MEWS: Escalate Yellow: discuss with charge nurse/RN and consider discussing with provider and RRT  Notify: Charge Nurse/RN  Name of Charge Nurse/RN Notified Tom  Date Charge Nurse/RN Notified 12/03/19  Time Charge Nurse/RN Notified 2100  patient had been previous yellow since 1445 Patient is asymptomatic and resting,  Patient did had a dose of 40 mg lasix  IV at 1815. Will continue mews protocols

## 2019-12-03 NOTE — Progress Notes (Signed)
PROGRESS NOTE    Ronald Olsen  HFW:[id number] DOB: 10-Jan-1951 DOA: 11/23/2019 PCP: Pcp, No    Brief Narrative:  69 year old male with past medical history for hypertension, dyslipidemia who presents with dyspnea, malaise, fatigue and cough for about 10 days. Acute worsening of his symptoms over the last 72 hours prior to hospitalization. Multiple sick contacts at home, he has not been vaccinated. Due to severe symptoms he called EMS, his oxygen saturation was in the 70s and he was placed on a 10 L nonrebreather mask and transported to the hospital. On his initial physical examination he was afebrile, but tachycardic and tachypneic, requiring 6 L/min of supplemental oxygen per nasal cannula. His lungs had no wheezing but positive tachypnea, heart S1-S2, present, tachycardic, abdomen soft, no lower extremity edema. Chest radiograph with bilateral infiltrates at the lower lobes, peripherally, more left than right.  He has been placed on aggressive medical therapy with high dose systemic steroids, IV remdesivir and oral baricitinib. Patient with persistent high oxygen requirements despite diuresis, for suspected non cardiogenic pulmonary edema.   Assessment & Plan:   Principal Problem:   Acute hypoxemic respiratory failure due to COVID-19 [facility]) Active Problems:   CAD (coronary artery disease)   Essential hypertension   Hyponatremia   Hyperlipidemia  Acute hypoxemic respiratory failure due to COVID-19 virus pneumonia: Continue to monitor due to significant symptoms  chest physiotherapy, incentive spirometry, deep breathing exercises, sputum induction, mucolytic's and bronchodilators. Supplemental oxygen to keep saturations more than 90%. Covid directed therapy with , steroids, remains on high-dose Solu-Medrol remdesivir, finished 5 days of therapy actemra, on baricitinib day 10/14 Due to severity of symptoms, patient will need daily inflammatory markers, chest x-rays,  liver function test to monitor and direct COVID-19 therapies. With persistent hypoxemia and elevated D-dimer, will check CTA of the chest to look for any pulmonary embolism as well as extent of pneumonia.  COVID-19 Labs  Recent Labs    12/01/19 0440 12/02/19 0344 12/03/19 0335  DDIMER 0.67* 0.83* 1.55*  FERRITIN 717* 664* 740*  CRP 1.0* 0.7 0.6    Lab Results  Component Value Date   SARSCOV2NAA POSITIVE (A) 11/29/2019   SpO2: 92 % O2 Flow Rate (L/min): 13 L/min FiO2 (%): 100 %  Hypertension: Blood pressure stable.  History of coronary artery disease: On dual antiplatelet therapy with aspirin and Plavix. Continue.  Acute kidney injury on chronic kidney disease stage II with hyponatremia and non-anion gap metabolic acidosis and hyperkalemia: Renal function stable.  Type 2 diabetes: On sliding scale insulin while on steroids.  DVT prophylaxis: Lovenox   Code Status: Full code Family Communication: wife , left message upon not on the table all call you back thank you Disposition Plan: Status is: Inpatient  Remains inpatient appropriate because:Inpatient level of care appropriate due to severity of illness   Dispo:  Patient From: Home  Planned Disposition: Home with Health Care Svc  Expected discharge date: 12/08/19  Medically stable for discharge: No          Consultants:   None  Procedures:   None  Antimicrobials:   Remdesivir, finished therapy   Subjective: Patient seen and examined.  No overnight events.  He still remains on high flow oxygen.  He feels okay at rest.  Able to maintain oxygenation on 13 L.  Denies any chest pain or palpitations.  Not mobilized much.  Objective: Vitals:   12/02/19 1800 12/02/19 2128 12/03/19 0125 12/03/19 1445  BP: 121/79 127/85 113/76 93/70  Pulse: Marland Kitchen)  104 (!) 102 87 (!) 103  Resp: 20 20 20 16   Temp: 97.8 F (36.6 C) 99 F (37.2 C) 99.1 F (37.3 C) 98.1 F (36.7 C)  TempSrc: Oral  Oral   SpO2: (!) 88% 96%  98% 92%  Weight:      Height:        Intake/Output Summary (Last 24 hours) at 12/03/2019 1448 Last data filed at 12/03/2019 1409 Gross per 24 hour  Intake 360 ml  Output 1350 ml  Net -990 ml   Filed Weights   11/23/2019 1418  Weight: 99.8 kg    Examination:  General exam: Appears acutely and chronically sick looking, in mild respiratory distress. Respiratory system: No added sounds.  Some conducted airway noise. Cardiovascular system: S1 & S2 heard, RRR. No JVD, murmurs, rubs, gallops or clicks. No pedal edema. Gastrointestinal system: Abdomen is nondistended, soft and nontender. No organomegaly or masses felt. Normal bowel sounds heard. Central nervous system: Alert and oriented. No focal neurological deficits. Extremities: Symmetric 5 x 5 power. Skin: No rashes, lesions or ulcers Psychiatry: Judgement and insight appear normal. Mood & affect appropriate.     Data Reviewed: I have personally reviewed following labs and imaging studies  CBC: Recent Labs  Lab 12/03/19 1013  WBC 28.6*  NEUTROABS 23.9*  HGB 14.8  HCT 43.2  MCV 90.8  PLT 676   Basic Metabolic Panel: Recent Labs  Lab 11/29/19 0558 11/30/19 1133 12/01/19 0440 12/02/19 0344 12/03/19 0335  NA 135 133* 134* 133* 134*  K 5.6* 4.9 6.0* 5.2* 4.8  CL 103 102 101 101 100  CO2 24 20* 23 24 26   GLUCOSE 110* 174* 132* 134* 95  BUN 48* 44* 42* 48* 45*  CREATININE 1.25* 1.05 1.29* 1.24 1.19  CALCIUM 8.9 8.9 8.9 8.8* 8.7*   GFR: Estimated Creatinine Clearance: 72.5 mL/min (by C-G formula based on SCr of 1.19 mg/dL). Liver Function Tests: Recent Labs  Lab 11/28/19 0401 11/29/19 0558 12/01/19 0440 12/02/19 0344 12/03/19 0335  AST 44* 35 24 24 26   ALT 52* 50* 38 38 41  ALKPHOS 66 66 59 57 61  BILITOT 1.0 0.9 0.7 0.6 0.8  PROT 6.7 6.9 6.4* 6.2* 6.2*  ALBUMIN 3.5 3.4* 3.0* 3.0* 3.0*   No results for input(s): LIPASE, AMYLASE in the last 168 hours. No results for input(s): AMMONIA in the last 168  hours. Coagulation Profile: No results for input(s): INR, PROTIME in the last 168 hours. Cardiac Enzymes: No results for input(s): CKTOTAL, CKMB, CKMBINDEX, TROPONINI in the last 168 hours. BNP (last 3 results) No results for input(s): PROBNP in the last 8760 hours. HbA1C: No results for input(s): HGBA1C in the last 72 hours. CBG: Recent Labs  Lab 12/02/19 1121 12/02/19 1552 12/02/19 2125 12/03/19 0751 12/03/19 1111  GLUCAP 173* 212* 149* 78 131*   Lipid Profile: No results for input(s): CHOL, HDL, LDLCALC, TRIG, CHOLHDL, LDLDIRECT in the last 72 hours. Thyroid Function Tests: No results for input(s): TSH, T4TOTAL, FREET4, T3FREE, THYROIDAB in the last 72 hours. Anemia Panel: Recent Labs    12/02/19 0344 12/03/19 0335  FERRITIN 664* 740*   Sepsis Labs: No results for input(s): PROCALCITON, LATICACIDVEN in the last 168 hours.  Recent Results (from the past 240 hour(s))  SARS Coronavirus 2 by RT PCR (hospital order, performed in [facility] hospital lab) Nasopharyngeal Nasopharyngeal Swab     Status: Abnormal   Collection Time: 12/02/2019 10:51 AM   Specimen: Nasopharyngeal Swab  Result Value Ref Range  Status   SARS Coronavirus 2 POSITIVE (A) NEGATIVE Final    Comment: RESULT CALLED TO, READ BACK BY AND VERIFIED WITH: WEST,S. RN @1234  ON 9.20.2021 BY NMCCOY (NOTE) SARS-CoV-2 target nucleic acids are DETECTED  SARS-CoV-2 RNA is generally detectable in upper respiratory specimens  during the acute phase of infection.  Positive results are indicative  of the presence of the identified virus, but do not rule out bacterial infection or co-infection with other pathogens not detected by the test.  Clinical correlation with patient history and  other diagnostic information is necessary to determine patient infection status.  The expected result is negative.  Fact Sheet for Patients:   StrictlyIdeas.no   Fact Sheet for Healthcare Providers:     BankingDealers.co.za    This test is not yet approved or cleared by the [place] FDA and  has been authorized for detection and/or diagnosis of SARS-CoV-2 by FDA under an Emergency Use Authorization (EUA).  This EUA will remain in effect (meaning th is test can be used) for the duration of  the COVID-19 declaration under Section 564(b)(1) of the Act, 21 U.S.C. section 360-bbb-3(b)(1), unless the authorization is terminated or revoked sooner.  Performed at [facility], [place] [address], [place], [place] [address]   Blood Culture (routine x 2)     Status: None   Collection Time: 11/18/2019 10:51 AM   Specimen: BLOOD RIGHT HAND  Result Value Ref Range Status   Specimen Description   Final    BLOOD RIGHT HAND Performed at [facility], [place] [address], [place], [place] [address]    Special Requests   Final    BOTTLES DRAWN AEROBIC AND ANAEROBIC Blood Culture adequate volume Performed at [facility] [address] [place], [place] [address]    Culture   Final    NO GROWTH 5 DAYS Performed at [facility], [place] [address] [place], [place] [address]    Report Status 11/29/2019 FINAL  Final  Blood Culture (routine x 2)     Status: None   Collection Time: 11/28/2019  1:10 PM   Specimen: BLOOD  Result Value Ref Range Status   Specimen Description   Final    BLOOD LEFT WRIST Performed at [facility] [address] [place], [place] [address]    Special Requests   Final    BOTTLES DRAWN AEROBIC AND ANAEROBIC Blood Culture adequate volume Performed at [place] [address] [place], [place] [address]    Culture   Final    NO GROWTH 5 DAYS Performed at [facility], [place] [address], [place], [place] [address]    Report Status 11/29/2019 FINAL  Final         Radiology Studies: No results found.      Scheduled Meds: . alfuzosin  10 mg Oral Q  breakfast  . aspirin EC  81 mg Oral Daily  . atorvastatin  40 mg Oral Daily  . baricitinib  4 mg Oral Daily  . chlorpheniramine-HYDROcodone  5 mL Oral Q12H  . clopidogrel  75 mg Oral Daily  . cyclobenzaprine  10 mg Oral BID  . enoxaparin (LOVENOX) injection  40 mg Subcutaneous Q24H  . feeding supplement (ENSURE ENLIVE)  237 mL Oral BID BM  . finasteride  5 mg Oral Daily  . insulin aspart  0-5 Units Subcutaneous QHS  . insulin aspart  0-9 Units Subcutaneous TID WC  . Ipratropium-Albuterol  1 puff Inhalation QID  . methylPREDNISolone (SOLU-MEDROL) injection  60 mg Intravenous Q12H  . pantoprazole  40 mg Oral Daily  . sodium chloride flush  3 mL Intravenous Q12H   Continuous Infusions:   LOS: 9 days    Time spent: 35 minutes    Barb Merino, MD [facility] Pager [phone]

## 2019-12-03 NOTE — Evaluation (Addendum)
Physical Therapy Evaluation Patient Details Name: Ronald Olsen MRN: [id number] DOB: 24-Feb-1951 Today's Date: 12/03/2019   History of Present Illness  Patient is a 69 year old male past medical history for hypertension, dyslipidemia who presents with dyspnea, malaise, fatigue and cough for about 10 days. Patient diagnosed with COVID  Clinical Impression  Pt admitted with above diagnosis. Pt transferred from bed to recliner without an assistive device, no loss of balance. SaO2 69% on 13L HFNC NRB mask during transfer.  After 3 minutes seated rest, SaO2 up to 77%.  4/4 dyspnea with minimal activity. Reviewed use of incentive spirometer.  Pt currently with functional limitations due to the deficits listed below (see PT Problem List). Pt will benefit from skilled PT to increase their independence and safety with mobility to allow discharge to the venue listed below.       Follow Up Recommendations Home health PT    Equipment Recommendations  Other (comment) (rollator for energy conservation)    Recommendations for Other Services       Precautions / Restrictions Precautions Precautions: Fall Precaution Comments: monitor vitals      Mobility  Bed Mobility Overal bed mobility: Modified Independent             General bed mobility comments: used rails, increased time/effort  Transfers Overall transfer level: Needs assistance Equipment used: None Transfers: Sit to/from [facility] Sit to Stand: Min guard Stand pivot transfers: Min guard       General transfer comment: no physical assist to power up to standing, min G for transfer to recliner due to mild unsteadiness; SaO2 69% on 13L HFNC NRB with SPT  Ambulation/Gait             General Gait Details: deferred 2* hypoxia and 4/4 dyspnea with minimal activity  Stairs            Wheelchair Mobility    Modified Rankin (Stroke Patients Only)       Balance Overall balance assessment: Mild  deficits observed, not formally tested   Sitting balance-Leahy Scale: Good     Standing balance support: During functional activity Standing balance-Leahy Scale: Good                               Pertinent Vitals/Pain Pain Assessment: 0-10 Pain Score: 3  Pain Location: headache Pain Descriptors / Indicators: Aching Pain Intervention(s): Limited activity within patient's tolerance;Monitored during session;Patient requesting pain meds-RN notified    Home Living Family/patient expects to be discharged to:: Private residence Living Arrangements: Spouse/significant other Available Help at Discharge: Family;Available 24 hours/day Type of Home: House Home Access: Stairs to enter Entrance Stairs-Rails: Psychiatric nurse of Steps: 8 Home Layout: Two level;Other (Comment) (mother in law lives in basement) Home Equipment: None      Prior Function Level of Independence: Independent               Hand Dominance   Dominant Hand: Right    Extremity/Trunk Assessment   Upper Extremity Assessment Upper Extremity Assessment: Defer to OT evaluation    Lower Extremity Assessment Lower Extremity Assessment: Overall WFL for tasks assessed    Cervical / Trunk Assessment Cervical / Trunk Assessment: Normal  Communication   Communication: No difficulties  Cognition Arousal/Alertness: Awake/alert Behavior During Therapy: WFL for tasks assessed/performed Overall Cognitive Status: Within Functional Limits for tasks assessed  General Comments      Exercises     Assessment/Plan    PT Assessment Patient needs continued PT services  PT Problem List Cardiopulmonary status limiting activity;Decreased mobility;Decreased activity tolerance       PT Treatment Interventions Gait training;Therapeutic exercise;Therapeutic activities;Functional mobility training;Patient/family education    PT Goals  (Current goals can be found in the Care Plan section)  Acute Rehab PT Goals Patient Stated Goal: go home PT Goal Formulation: With patient Time For Goal Achievement: 12/17/19 Potential to Achieve Goals: Good    Frequency Min 3X/week   Barriers to discharge        Co-evaluation               AM-PAC PT "6 Clicks" Mobility  Outcome Measure Help needed turning from your back to your side while in a flat bed without using bedrails?: A Little Help needed moving from lying on your back to sitting on the side of a flat bed without using bedrails?: A Little Help needed moving to and from a bed to a chair (including a wheelchair)?: A Little Help needed standing up from a chair using your arms (e.g., wheelchair or bedside chair)?: A Little Help needed to walk in hospital room?: Total Help needed climbing 3-5 steps with a railing? : Total 6 Click Score: 14    End of Session Equipment Utilized During Treatment: Oxygen Activity Tolerance: Treatment limited secondary to medical complications (Comment) (hypoxia with activity) Patient left: in chair;with call bell/phone within reach;with nursing/sitter in room Nurse Communication: Mobility status PT Visit Diagnosis: Difficulty in walking, not elsewhere classified (R26.2)    Time: 1610-9604 PT Time Calculation (min) (ACUTE ONLY): 12 min   Charges:   PT Evaluation $PT Eval Low Complexity: 1 Low         Philomena Doheny PT 12/03/2019  Acute Rehabilitation Services Pager [phone] Office [phone]

## 2019-12-04 LAB — COMPREHENSIVE METABOLIC PANEL
ALT: 41 U/L (ref 0–44)
AST: 25 U/L (ref 15–41)
Albumin: 3 g/dL — ABNORMAL LOW (ref 3.5–5.0)
Alkaline Phosphatase: 68 U/L (ref 38–126)
Anion gap: 10 (ref 5–15)
BUN: 48 mg/dL — ABNORMAL HIGH (ref 8–23)
CO2: 23 mmol/L (ref 22–32)
Calcium: 8.8 mg/dL — ABNORMAL LOW (ref 8.9–10.3)
Chloride: 98 mmol/L (ref 98–111)
Creatinine, Ser: 1.19 mg/dL (ref 0.61–1.24)
GFR calc Af Amer: >60 mL/min (ref >60)
GFR calc non Af Amer: >60 mL/min (ref >60)
Glucose, Bld: 157 mg/dL — ABNORMAL HIGH (ref 70–99)
Potassium: 5 mmol/L (ref 3.5–5.1)
Sodium: 131 mmol/L — ABNORMAL LOW (ref 135–145)
Total Bilirubin: 0.9 mg/dL (ref 0.3–1.2)
Total Protein: 6.6 g/dL (ref 6.5–8.1)

## 2019-12-04 LAB — D-DIMER, QUANTITATIVE: D-Dimer, Quant: 1.72 ug/mL-FEU — ABNORMAL HIGH (ref 0.00–0.50)

## 2019-12-04 LAB — GLUCOSE, CAPILLARY
Glucose-Capillary: 152 mg/dL — ABNORMAL HIGH (ref 70–99)
Glucose-Capillary: 185 mg/dL — ABNORMAL HIGH (ref 70–99)
Glucose-Capillary: 178 mg/dL — ABNORMAL HIGH (ref 70–99)

## 2019-12-04 LAB — C-REACTIVE PROTEIN: CRP: 13.6 mg/dL — ABNORMAL HIGH (ref <1.0)

## 2019-12-04 LAB — FERRITIN: Ferritin: 916 ng/mL — ABNORMAL HIGH (ref 24–336)

## 2019-12-04 NOTE — Progress Notes (Signed)
Occupational Therapy Progress Note  Patient agreeable to try and ambulate in room. Initially on 13L HFNC and NRB. With bed mobility pt desaturate to low 80s "I don't feel like I'm getting enough air" when switched to portable tanks. Increase patient to 15L and increased time to recover to 85%. Patient stood EOB with min A for safety for less than 18min before stating "I need to sit back down" and desaturate to low 70s. Patient with increased anxiety, require max cues for pursed lip breathing. Able to recover to high 70s before transfer to recliner as patient's bed linens soiled. Min A for safety/stability due to tremulous, pt desat to low 70s. Cues for breathing strategies and increased time to recover to high 70s, RN made aware and present in room at end of treatment.    12/04/19 1500  OT Visit Information  Last OT Received On 12/04/19  Assistance Needed +1  PT/OT/SLP Co-Evaluation/Treatment Yes  Reason for Co-Treatment Complexity of the patient's impairments (multi-system involvement);To address functional/ADL transfers  OT goals addressed during session ADL's and self-care  History of Present Illness Patient is a 69 year old male past medical history for hypertension, dyslipidemia who presents with dyspnea, malaise, fatigue and cough for about 10 days. Patient diagnosed with COVID  Precautions  Precautions Fall  Precaution Comments monitor vitals, HFNC and partial NRB bumped from 13 to 15L with activity  Pain Assessment  Pain Assessment Faces  Faces Pain Scale 0  Cognition  Arousal/Alertness Awake/alert  Behavior During Therapy Anxious  Overall Cognitive Status Within Functional Limits for tasks assessed  General Comments becomes more anxious with activity/desat  ADL  Overall ADL's  Needs assistance/impaired  Toilet Transfer Minimal assistance;Stand-pivot  Toilet Transfer Details (indicate cue type and reason) to recliner, min A for stability due to tremulous and decreased activity  tolerance  Functional mobility during ADLs Minimal assistance  General ADL Comments patient with limited activity tolerance due to cardiopulmonary status and increased anxiety with desaturation, max cues for pursed lip breathing techniques. increased time for seated recovery  Bed Mobility  Overal bed mobility Modified Independent  Balance  Overall balance assessment Needs assistance  Sitting-balance support Feet supported  Sitting balance-Leahy Scale Good  Standing balance support During functional activity  Standing balance-Leahy Scale Fair  Standing balance comment static standing no UE support, min HHA to chair due to mild unsteady/tremulous  Restrictions  Weight Bearing Restrictions No  Transfers  Overall transfer level Needs assistance  Equipment used 1 person hand held assist  Transfers Sit to/from Stand;Stand Pivot Transfers  Sit to Stand Min guard  Stand pivot transfers Min assist  General transfer comment min A to chair due to decreased activity tolerance, mild unsteady/tremulous  General Comments  General comments (skin integrity, edema, etc.) pt initially on 13L HFNC and partial NRB with bed mobility desaturates to low 80s "I feel like I'm not getting enough air" increase to 15L. patient desaturate in standing to low 70s, requires max cues for pursed lip breathing and use of flutter valvue to recover to high 70s RN aware/present in room at end of session  OT - End of Session  Equipment Utilized During Treatment Oxygen  Activity Tolerance Patient limited by fatigue  Patient left in chair;with call bell/phone within reach;with nursing/sitter in room  Nurse Communication Other (comment) (vitals)  OT Assessment/Plan  OT Plan Discharge plan remains appropriate  OT Visit Diagnosis Muscle weakness (generalized) (M62.81);Other abnormalities of gait and mobility (R26.89)  OT Frequency (ACUTE ONLY) Min 2X/week  Follow Up Recommendations No OT follow up  OT Equipment Tub/shower seat   AM-PAC OT "6 Clicks" Daily Activity Outcome Measure (Version 2)  Help from another person eating meals? 4  Help from another person taking care of personal grooming? 3  Help from another person toileting, which includes using toliet, bedpan, or urinal? 3  Help from another person bathing (including washing, rinsing, drying)? 3  Help from another person to put on and taking off regular upper body clothing? 3  Help from another person to put on and taking off regular lower body clothing? 3  6 Click Score 19  OT Goal Progression  Progress towards OT goals Progressing toward goals  Acute Rehab OT Goals  Patient Stated Goal go home  OT Goal Formulation With patient  Time For Goal Achievement 12/16/19  Potential to Achieve Goals Good  ADL Goals  Pt Will Perform Lower Body Dressing with modified independence  Pt Will Transfer to Toilet with modified independence;ambulating;regular height toilet  Pt/caregiver will Perform Home Exercise Program Increased strength;Both right and left upper extremity;With written HEP provided;Independently  Additional ADL Goal #1 Patient will identify 3 energy conservation strategies to implement at home in order to maximize safety and indepedence with daily routine.  Additional ADL Goal #2 Patient will implement pursed lip breathing strategies in 2/3 trials of theraputic activity with no verbal cues.  OT Time Calculation  OT Start Time (ACUTE ONLY) 1359  OT Stop Time (ACUTE ONLY) 1423  OT Time Calculation (min) 24 min  OT General Charges  $OT Visit 1 Visit  OT Treatments  $Self Care/Home Management  8-22 mins   Delbert Phenix OT OT pager: [phone]

## 2019-12-04 NOTE — Progress Notes (Signed)
Physical Therapy Treatment Patient Details Name: Ronald Olsen MRN: [id number] DOB: 1950/04/14 Today's Date: 12/04/2019    History of Present Illness Patient is a 69 year old male past medical history for hypertension, dyslipidemia who presents with dyspnea, malaise, fatigue and cough for about 10 days. Patient diagnosed with COVID    PT Comments    Patient received on 13 [place] and 15 L NRB. Resting SPO2 on mid  90's. Dropping to mid  70's with sitting and standing. Unable to progress beyond transfer to recliner.  Patient remains markedly compromised for oxygenation during minimal activity. Patient does give it his best. Continue progressive activity as tolerated.  Follow Up Recommendations  Home health PT     Equipment Recommendations  Other (comment)    Recommendations for Other Services       Precautions / Restrictions Precautions Precautions: Fall Precaution Comments: monitor vitals, HFNC and partial NRB bumped from 13 to 15L with activity Restrictions Weight Bearing Restrictions: No    Mobility  Bed Mobility Overal bed mobility: Modified Independent             General bed mobility comments: used rails, increased time/effort  Transfers Overall transfer level: Needs assistance Equipment used: 1 person hand held assist Transfers: Sit to/from [facility] Sit to Stand: Min guard Stand pivot transfers: Min assist       General transfer comment: min A to chair due to decreased activity tolerance, mild unsteady/tremulous  Ambulation/Gait             General Gait Details: deferred 2* hypoxia and 4/4 dyspnea with minimal activity   Stairs             Wheelchair Mobility    Modified Rankin (Stroke Patients Only)       Balance Overall balance assessment: Needs assistance Sitting-balance support: Feet supported Sitting balance-Leahy Scale: Good     Standing balance support: During functional activity Standing  balance-Leahy Scale: Fair Standing balance comment: static standing no UE support, min HHA to chair due to mild unsteady/tremulous                            Cognition Arousal/Alertness: Awake/alert Behavior During Therapy: Anxious Overall Cognitive Status: Within Functional Limits for tasks assessed                                 General Comments: becomes more anxious with activity/desat      Exercises      General Comments General comments (skin integrity, edema, etc.): pt initially on 13L HFNC and partial NRB with bed mobility desaturates to low 80s "I feel like I'm not getting enough air" increase to 15L. patient desaturate in standing to low 70s, requires max cues for pursed lip breathing and use of flutter valvue to recover to high 70s RN aware/present in room at end of session      Pertinent Vitals/Pain Pain Assessment: No/denies pain Faces Pain Scale: No hurt    Home Living                      Prior Function            PT Goals (current goals can now be found in the care plan section) Acute Rehab PT Goals Patient Stated Goal: go home Progress towards PT goals: Progressing toward goals    Frequency  Min 3X/week      PT Plan Current plan remains appropriate    Co-evaluation PT/OT/SLP Co-Evaluation/Treatment: Yes Reason for Co-Treatment: For patient/therapist safety;Complexity of the patient's impairments (multi-system involvement) PT goals addressed during session: Mobility/safety with mobility OT goals addressed during session: ADL's and self-care      AM-PAC PT "6 Clicks" Mobility   Outcome Measure  Help needed turning from your back to your side while in a flat bed without using bedrails?: A Little Help needed moving from lying on your back to sitting on the side of a flat bed without using bedrails?: A Little Help needed moving to and from a bed to a chair (including a wheelchair)?: A Little Help needed  standing up from a chair using your arms (e.g., wheelchair or bedside chair)?: A Little Help needed to walk in hospital room?: Total Help needed climbing 3-5 steps with a railing? : Total 6 Click Score: 14    End of Session Equipment Utilized During Treatment: Oxygen Activity Tolerance: Treatment limited secondary to medical complications (Comment) Patient left: in chair;with call bell/phone within reach;with nursing/sitter in room Nurse Communication: Mobility status PT Visit Diagnosis: Difficulty in walking, not elsewhere classified (R26.2)     Time: 5537-4827 PT Time Calculation (min) (ACUTE ONLY): 24 min  Charges:  $Therapeutic Activity: 8-22 mins                     [place] Pager [phone] Office [phone]    Claretha Cooper 12/04/2019, 5:17 PM

## 2019-12-04 NOTE — Progress Notes (Signed)
PROGRESS NOTE    Ronald Olsen  OZD:[id number] DOB: Nov 09, 1950 DOA: 11/28/2019 PCP: Pcp, No    Brief Narrative:  69 year old male with past medical history for hypertension, dyslipidemia who presents with dyspnea, malaise, fatigue and cough for about 10 days. Acute worsening of his symptoms over the last 72 hours prior to hospitalization. Multiple sick contacts at home, he has not been vaccinated. Due to severe symptoms he called EMS, his oxygen saturation was in the 70s and he was placed on a 10 L nonrebreather mask and transported to the hospital. On his initial physical examination he was afebrile, but tachycardic and tachypneic, requiring 6 L/min of supplemental oxygen per nasal cannula. His lungs had no wheezing but positive tachypnea, heart S1-S2, present, tachycardic, abdomen soft, no lower extremity edema. Chest radiograph with bilateral infiltrates at the lower lobes, peripherally, more left than right.  He has been placed on aggressive medical therapy with high dose systemic steroids, IV remdesivir and oral baricitinib. Patient with persistent high oxygen requirements despite maximum therapies. Assessment & Plan:   Principal Problem:   Acute hypoxemic respiratory failure due to COVID-19 [facility]) Active Problems:   CAD (coronary artery disease)   Essential hypertension   Hyponatremia   Hyperlipidemia  Acute hypoxemic respiratory failure due to COVID-19 virus pneumonia: Continue to monitor due to significant symptoms, remains on significantly high oxygen requirement. chest physiotherapy, incentive spirometry, deep breathing exercises, sputum induction, mucolytic's and bronchodilators. Supplemental oxygen to keep saturations more than 85 %. Covid directed therapy with , steroids, remains on high-dose Solu-Medrol remdesivir, finished 5 days of therapy actemra, on baricitinib day 11/14 Due to severity of symptoms, patient will need daily inflammatory markers, chest  x-rays, liver function test to monitor and direct COVID-19 therapies. CTA 9/29 with extensive pneumonia, no PE. We will try intermittent Lasix for diuresis.  COVID-19 Labs  Recent Labs    12/02/19 0344 12/03/19 0335 12/04/19 0338  DDIMER 0.83* 1.55* 1.72*  FERRITIN 664* 740* 916*  CRP 0.7 0.6 13.6*    Lab Results  Component Value Date   SARSCOV2NAA POSITIVE (A) 11/08/2019   SpO2: 93 % O2 Flow Rate (L/min): 13 L/min FiO2 (%): 100 %  Hypertension: Blood pressure stable.  History of coronary artery disease: On dual antiplatelet therapy with aspirin and Plavix. Continue.  Acute kidney injury on chronic kidney disease stage II with hyponatremia and non-anion gap metabolic acidosis and hyperkalemia: Renal function stable.  Type 2 diabetes: On sliding scale insulin while on steroids.  DVT prophylaxis: Lovenox   Code Status: Full code Family Communication: Wife on the phone 9/29 Disposition Plan: Status is: Inpatient  Remains inpatient appropriate because:Inpatient level of care appropriate due to severity of illness   Dispo:  Patient From: Home  Planned Disposition: Home with Health Care Svc  Expected discharge date: 12/08/19  Medically stable for discharge: No          Consultants:   None  Procedures:   None  Antimicrobials:   Remdesivir, finished therapy   Subjective: Seen and examined.  No overnight events.  Still on 13 L of oxygen with some supplemental oxygen well eating or other activities.  He feels okay and got some rest at night.  Objective: Vitals:   12/04/19 0021 12/04/19 0554 12/04/19 0933 12/04/19 1016  BP: 100/67 103/78  118/84  Pulse: 98 88  (!) 107  Resp: 18 17  (!) 22  Temp: 98.2 F (36.8 C) 98.1 F (36.7 C)  97.9 F (36.6 C)  TempSrc:  Oral   Oral  SpO2: 96% 91% (!) 89% 93%  Weight:      Height:        Intake/Output Summary (Last 24 hours) at 12/04/2019 1210 Last data filed at 12/04/2019 0851 Gross per 24 hour  Intake  --  Output 2100 ml  Net -2100 ml   Filed Weights   11/09/2019 1418  Weight: 99.8 kg    Examination:  General exam: In moderate respiratory distress, sick looking. Respiratory system: No added sounds.  Mostly conducted airway sounds. Cardiovascular system: S1 & S2 heard, RRR. No JVD, murmurs, rubs, gallops or clicks. No pedal edema. Gastrointestinal system: Abdomen is nondistended, soft and nontender. No organomegaly or masses felt. Normal bowel sounds heard. Central nervous system: Alert and oriented. No focal neurological deficits. Extremities: Symmetric 5 x 5 power. Skin: No rashes, lesions or ulcers Psychiatry: Judgement and insight appear normal. Mood & affect appropriate.     Data Reviewed: I have personally reviewed following labs and imaging studies  CBC: Recent Labs  Lab 12/03/19 1013  WBC 28.6*  NEUTROABS 23.9*  HGB 14.8  HCT 43.2  MCV 90.8  PLT 765   Basic Metabolic Panel: Recent Labs  Lab 11/30/19 1133 12/01/19 0440 12/02/19 0344 12/03/19 0335 12/04/19 0338  NA 133* 134* 133* 134* 131*  K 4.9 6.0* 5.2* 4.8 5.0  CL 102 101 101 100 98  CO2 20* 23 24 26 23   GLUCOSE 174* 132* 134* 95 157*  BUN 44* 42* 48* 45* 48*  CREATININE 1.05 1.29* 1.24 1.19 1.19  CALCIUM 8.9 8.9 8.8* 8.7* 8.8*   GFR: Estimated Creatinine Clearance: 72.5 mL/min (by C-G formula based on SCr of 1.19 mg/dL). Liver Function Tests: Recent Labs  Lab 11/29/19 0558 12/01/19 0440 12/02/19 0344 12/03/19 0335 12/04/19 0338  AST 35 24 24 26 25   ALT 50* 38 38 41 41  ALKPHOS 66 59 57 61 68  BILITOT 0.9 0.7 0.6 0.8 0.9  PROT 6.9 6.4* 6.2* 6.2* 6.6  ALBUMIN 3.4* 3.0* 3.0* 3.0* 3.0*   No results for input(s): LIPASE, AMYLASE in the last 168 hours. No results for input(s): AMMONIA in the last 168 hours. Coagulation Profile: No results for input(s): INR, PROTIME in the last 168 hours. Cardiac Enzymes: No results for input(s): CKTOTAL, CKMB, CKMBINDEX, TROPONINI in the last 168  hours. BNP (last 3 results) No results for input(s): PROBNP in the last 8760 hours. HbA1C: No results for input(s): HGBA1C in the last 72 hours. CBG: Recent Labs  Lab 12/03/19 1111 12/03/19 1614 12/03/19 2219 12/04/19 0753 12/04/19 1143  GLUCAP 131* 115* 175* 152* 185*   Lipid Profile: No results for input(s): CHOL, HDL, LDLCALC, TRIG, CHOLHDL, LDLDIRECT in the last 72 hours. Thyroid Function Tests: No results for input(s): TSH, T4TOTAL, FREET4, T3FREE, THYROIDAB in the last 72 hours. Anemia Panel: Recent Labs    12/03/19 0335 12/04/19 0338  FERRITIN 740* 916*   Sepsis Labs: No results for input(s): PROCALCITON, LATICACIDVEN in the last 168 hours.  Recent Results (from the past 240 hour(s))  Blood Culture (routine x 2)     Status: None   Collection Time: 11/08/2019  1:10 PM   Specimen: BLOOD  Result Value Ref Range Status   Specimen Description   Final    BLOOD LEFT WRIST Performed at [facility] [address], [place], [place] [address]    Special Requests   Final    BOTTLES DRAWN AEROBIC AND ANAEROBIC Blood Culture adequate volume Performed at  [facility], [place] [address], [place], [place] [address]    Culture   Final    NO GROWTH 5 DAYS Performed at [facility], [place] [address] [place], [place] [address]    Report Status 11/29/2019 FINAL  Final         Radiology Studies: CT ANGIO CHEST PE W OR WO CONTRAST  Result Date: 12/03/2019 CLINICAL DATA:  69 year old male with acutely worsening cough and fatigue. High clinical concern for pulmonary embolism and COVID. EXAM: CT ANGIOGRAPHY CHEST WITH CONTRAST TECHNIQUE: Multidetector CT imaging of the chest was performed using the standard protocol during bolus administration of intravenous contrast. Multiplanar CT image reconstructions and MIPs were obtained to evaluate the vascular anatomy. CONTRAST:  173mL OMNIPAQUE IOHEXOL 350 MG/ML SOLN COMPARISON:  None. FINDINGS:  Cardiovascular: Satisfactory opacification of the pulmonary arteries to the segmental level. No evidence of pulmonary embolism. Normal heart size. No pericardial effusion. Mediastinum/Nodes: Unremarkable CT appearance of the thyroid gland. No suspicious mediastinal or hilar adenopathy. No soft tissue mediastinal mass. The thoracic esophagus is unremarkable. Lungs/Pleura: Multifocal regions of geographic ground-glass attenuation airspace opacity scattered throughout all lobes of both lungs. Additionally, there are areas of volume loss and subsegmental atelectasis in the bilateral lower lobes. Moderate respiratory motion artifact. No evidence of pulmonary edema, pleural effusion or pneumothorax. Upper Abdomen: No acute abnormality. Surgical changes of prior cholecystectomy. Colonic interposition. Musculoskeletal: No acute fracture or aggressive appearing lytic or blastic osseous lesion. Review of the MIP images confirms the above findings. IMPRESSION: 1. Negative for acute pulmonary embolus. 2. Multifocal geographic regions of ground-glass attenuation airspace opacity scattered throughout all lobes of both lungs. Findings are most consistent with an acute infectious/inflammatory process such as COVID/viral pneumonia. Electronically Signed   By: Jacqulynn Cadet M.D.   On: 12/03/2019 15:54        Scheduled Meds: . alfuzosin  10 mg Oral Q breakfast  . aspirin EC  81 mg Oral Daily  . atorvastatin  40 mg Oral Daily  . baricitinib  4 mg Oral Daily  . chlorpheniramine-HYDROcodone  5 mL Oral Q12H  . clopidogrel  75 mg Oral Daily  . cyclobenzaprine  10 mg Oral BID  . enoxaparin (LOVENOX) injection  40 mg Subcutaneous Q24H  . feeding supplement (ENSURE ENLIVE)  237 mL Oral BID BM  . finasteride  5 mg Oral Daily  . insulin aspart  0-5 Units Subcutaneous QHS  . insulin aspart  0-9 Units Subcutaneous TID WC  . Ipratropium-Albuterol  1 puff Inhalation QID  . methylPREDNISolone (SOLU-MEDROL) injection  60 mg  Intravenous Q12H  . pantoprazole  40 mg Oral Daily  . sodium chloride flush  3 mL Intravenous Q12H   Continuous Infusions:   LOS: 10 days    Time spent: 35 minutes    Barb Merino, MD [facility] Pager [phone]

## 2019-12-05 DIAGNOSIS — J9601 Acute respiratory failure with hypoxia: Secondary | ICD-10-CM | POA: Diagnosis not present

## 2019-12-05 DIAGNOSIS — U071 COVID-19: Secondary | ICD-10-CM | POA: Diagnosis not present

## 2019-12-05 LAB — COMPREHENSIVE METABOLIC PANEL
ALT: 54 U/L — ABNORMAL HIGH (ref 0–44)
AST: 33 U/L (ref 15–41)
Albumin: 2.8 g/dL — ABNORMAL LOW (ref 3.5–5.0)
Alkaline Phosphatase: 63 U/L (ref 38–126)
Anion gap: 11 (ref 5–15)
BUN: 45 mg/dL — ABNORMAL HIGH (ref 8–23)
CO2: 21 mmol/L — ABNORMAL LOW (ref 22–32)
Calcium: 8.7 mg/dL — ABNORMAL LOW (ref 8.9–10.3)
Chloride: 98 mmol/L (ref 98–111)
Creatinine, Ser: 1.04 mg/dL (ref 0.61–1.24)
GFR calc Af Amer: >60 mL/min (ref >60)
GFR calc non Af Amer: >60 mL/min (ref >60)
Glucose, Bld: 143 mg/dL — ABNORMAL HIGH (ref 70–99)
Potassium: 5.1 mmol/L (ref 3.5–5.1)
Sodium: 130 mmol/L — ABNORMAL LOW (ref 135–145)
Total Bilirubin: 0.9 mg/dL (ref 0.3–1.2)
Total Protein: 6.3 g/dL — ABNORMAL LOW (ref 6.5–8.1)

## 2019-12-05 LAB — CBC WITH DIFFERENTIAL/PLATELET
Abs Immature Granulocytes: 1.43 10*3/uL — ABNORMAL HIGH (ref 0.00–0.07)
Basophils Absolute: 0.1 10*3/uL (ref 0.0–0.1)
Basophils Relative: 0 %
Eosinophils Absolute: 0 10*3/uL (ref 0.0–0.5)
Eosinophils Relative: 0 %
HCT: 42.2 % (ref 39.0–52.0)
Hemoglobin: 14.3 g/dL (ref 13.0–17.0)
Immature Granulocytes: 4 %
Lymphocytes Relative: 2 %
Lymphs Abs: 0.7 10*3/uL (ref 0.7–4.0)
MCH: 31.2 pg (ref 26.0–34.0)
MCHC: 33.9 g/dL (ref 30.0–36.0)
MCV: 91.9 fL (ref 80.0–100.0)
Monocytes Absolute: 1.2 10*3/uL — ABNORMAL HIGH (ref 0.1–1.0)
Monocytes Relative: 3 %
Neutro Abs: 35.6 10*3/uL — ABNORMAL HIGH (ref 1.7–7.7)
Neutrophils Relative %: 91 %
Platelets: 338 10*3/uL (ref 150–400)
RBC: 4.59 MIL/uL (ref 4.22–5.81)
RDW: 12.4 % (ref 11.5–15.5)
WBC: 39.1 10*3/uL — ABNORMAL HIGH (ref 4.0–10.5)
nRBC: 0 % (ref 0.0–0.2)

## 2019-12-05 LAB — C-REACTIVE PROTEIN: CRP: 7.36 mg/dL — ABNORMAL HIGH (ref <1.0)

## 2019-12-05 LAB — PROCALCITONIN: Procalcitonin: <0.1 ng/mL

## 2019-12-05 LAB — D-DIMER, QUANTITATIVE: D-Dimer, Quant: 1.64 ug/mL-FEU — ABNORMAL HIGH (ref 0.00–0.50)

## 2019-12-05 LAB — FERRITIN: Ferritin: 1091 ng/mL — ABNORMAL HIGH (ref 24–336)

## 2019-12-05 MED ORDER — FUROSEMIDE 10 MG/ML IJ SOLN
40.0000 mg | Freq: Every day | INTRAMUSCULAR | Status: DC
  Administered 2019-12-06 – 2019-12-07 (×2): 40 mg via INTRAVENOUS
  Filled 2019-12-05 (×2): qty 4

## 2019-12-05 NOTE — Progress Notes (Signed)
PROGRESS NOTE    Ronald Olsen  DJT:[id number] DOB: 10-Mar-1950 DOA: 11/18/2019 PCP: Pcp, No    Brief Narrative: 69 year old male with past medical history for hypertension, dyslipidemia who presented with dyspnea, malaise, fatigue and cough for about 10 days. Acute worsening of his symptoms over the last 72 hours prior to hospitalization. Multiple sick contacts at home, he has not been vaccinated. Due to severe symptoms he called EMS, his oxygen saturation was in the 70s and he was placed on a 10 L nonrebreather mask and transported to the hospital. On his initial physical examination he was afebrile, but tachycardic and tachypneic, requiring 6 L/min of supplemental oxygen per nasal cannula. His lungs had no wheezing but positive tachypnea, heart S1-S2, present, tachycardic, abdomen soft, no lower extremity edema. Chest radiograph with bilateral infiltrates at the lower lobes, peripherally, more left than right.  He has been placed on aggressive medical therapy with high dose systemic steroids, IV remdesivir and oral baricitinib. Patient with persistent high oxygen requirements despite maximum therapies. Assessment & Plan:   Principal Problem:   Acute hypoxemic respiratory failure due to COVID-19 [facility]) Active Problems:   CAD (coronary artery disease)   Essential hypertension   Hyponatremia   Hyperlipidemia  Acute hypoxemic respiratory failure due to COVID-19 virus pneumonia: Continue to monitor due to significant symptoms, remains on significantly high oxygen requirement. chest physiotherapy, incentive spirometry, deep breathing exercises, sputum induction, mucolytic's and bronchodilators. Supplemental oxygen to keep saturations more than 85 %. Covid directed therapy with , steroids, remains on high-dose Solu-Medrol remdesivir, finished 5 days of therapy actemra, on baricitinib day 12/14 Due to severity of symptoms, patient will need daily inflammatory markers, chest  x-rays, liver function test to monitor and direct COVID-19 therapies. CTA 9/29 with extensive pneumonia, no PE. We will try intermittent Lasix for diuresis.  COVID-19 Labs  Recent Labs    12/03/19 0335 12/04/19 0338 12/05/19 0335  DDIMER 1.55* 1.72* 1.64*  FERRITIN 740* 916* 1,091*  CRP 0.6 13.6* 7.36*    Lab Results  Component Value Date   SARSCOV2NAA POSITIVE (A) 11/12/2019   SpO2: 90 % O2 Flow Rate (L/min): 13 L/min FiO2 (%): 100 %  Hypertension: Blood pressure stable.  History of coronary artery disease: On dual antiplatelet therapy with aspirin and Plavix. Continue.  Acute kidney injury on chronic kidney disease stage II with hyponatremia and non-anion gap metabolic acidosis and hyperkalemia: Renal function stable.  Type 2 diabetes: On sliding scale insulin while on steroids.  Leukocytosis: Patient has very high WBC count today.  Procalcitonin is less than 0.1.  On high-dose IV steroids.  No evidence of superadded bacterial infection.  We will continue to monitor with daily CBC and procalcitonin.  Hold off on any antibiotics today.  DVT prophylaxis: Lovenox   Code Status: Full code Family Communication: Wife on the phone. Disposition Plan: Status is: Inpatient  Remains inpatient appropriate because:Inpatient level of care appropriate due to severity of illness   Dispo:  Patient From: Home  Planned Disposition: Home with Health Care Svc  Expected discharge date: 12/08/19  Medically stable for discharge: No          Consultants:   None  Procedures:   None  Antimicrobials:   Remdesivir, finished therapy   Subjective: Seen and examined.  No overnight events.  Remains on high flow oxygen.  He knows he is still critical, however he feels in good spirit.  Objective: Vitals:   12/05/19 0016 12/05/19 0504 12/05/19 0935 12/05/19 1349  BP: 112/74 101/60 123/68 122/80  Pulse: 95 91 (!) 110 (!) 113  Resp: 20 16 19    Temp: 98 F (36.7 C) 98.2 F  (36.8 C) 97.8 F (36.6 C) 99.2 F (37.3 C)  TempSrc:  Oral Oral Oral  SpO2: 90% 95% 90% 90%  Weight:      Height:        Intake/Output Summary (Last 24 hours) at 12/05/2019 1535 Last data filed at 12/05/2019 1300 Gross per 24 hour  Intake 200 ml  Output 875 ml  Net -675 ml   Filed Weights   11/23/2019 1418  Weight: 99.8 kg    Examination:  General exam: In moderate respiratory distress, hypoxic and tachypneic on talking and eating. Respiratory system: No added sounds.  Mostly conducted airway sounds. Cardiovascular system: Tachycardic.  S1-S2 heard. Gastrointestinal system: Abdomen is nondistended, soft and nontender. No organomegaly or masses felt. Normal bowel sounds heard. Central nervous system: Alert and oriented. No focal neurological deficits. Extremities: Symmetric 5 x 5 power. Skin: No rashes, lesions or ulcers Psychiatry: Judgement and insight appear normal. Mood & affect appropriate.     Data Reviewed: I have personally reviewed following labs and imaging studies  CBC: Recent Labs  Lab 12/03/19 1013 12/05/19 0335  WBC 28.6* 39.1*  NEUTROABS 23.9* 35.6*  HGB 14.8 14.3  HCT 43.2 42.2  MCV 90.8 91.9  PLT 400 161   Basic Metabolic Panel: Recent Labs  Lab 12/01/19 0440 12/02/19 0344 12/03/19 0335 12/04/19 0338 12/05/19 0335  NA 134* 133* 134* 131* 130*  K 6.0* 5.2* 4.8 5.0 5.1  CL 101 101 100 98 98  CO2 23 24 26 23  21*  GLUCOSE 132* 134* 95 157* 143*  BUN 42* 48* 45* 48* 45*  CREATININE 1.29* 1.24 1.19 1.19 1.04  CALCIUM 8.9 8.8* 8.7* 8.8* 8.7*   GFR: Estimated Creatinine Clearance: 83 mL/min (by C-G formula based on SCr of 1.04 mg/dL). Liver Function Tests: Recent Labs  Lab 12/01/19 0440 12/02/19 0344 12/03/19 0335 12/04/19 0338 12/05/19 0335  AST 24 24 26 25  33  ALT 38 38 41 41 54*  ALKPHOS 59 57 61 68 63  BILITOT 0.7 0.6 0.8 0.9 0.9  PROT 6.4* 6.2* 6.2* 6.6 6.3*  ALBUMIN 3.0* 3.0* 3.0* 3.0* 2.8*   No results for input(s): LIPASE,  AMYLASE in the last 168 hours. No results for input(s): AMMONIA in the last 168 hours. Coagulation Profile: No results for input(s): INR, PROTIME in the last 168 hours. Cardiac Enzymes: No results for input(s): CKTOTAL, CKMB, CKMBINDEX, TROPONINI in the last 168 hours. BNP (last 3 results) No results for input(s): PROBNP in the last 8760 hours. HbA1C: No results for input(s): HGBA1C in the last 72 hours. CBG: Recent Labs  Lab 12/03/19 1614 12/03/19 2219 12/04/19 0753 12/04/19 1143 12/04/19 1651  GLUCAP 115* 175* 152* 185* 178*   Lipid Profile: No results for input(s): CHOL, HDL, LDLCALC, TRIG, CHOLHDL, LDLDIRECT in the last 72 hours. Thyroid Function Tests: No results for input(s): TSH, T4TOTAL, FREET4, T3FREE, THYROIDAB in the last 72 hours. Anemia Panel: Recent Labs    12/04/19 0338 12/05/19 0335  FERRITIN 916* 1,091*   Sepsis Labs: Recent Labs  Lab 12/05/19 0335  PROCALCITON <0.10    No results found for this or any previous visit (from the past 240 hour(s)).       Radiology Studies: No results found.      Scheduled Meds: . alfuzosin  10 mg Oral Q breakfast  . aspirin EC  81 mg Oral Daily  . atorvastatin  40 mg Oral Daily  . baricitinib  4 mg Oral Daily  . chlorpheniramine-HYDROcodone  5 mL Oral Q12H  . clopidogrel  75 mg Oral Daily  . cyclobenzaprine  10 mg Oral BID  . enoxaparin (LOVENOX) injection  40 mg Subcutaneous Q24H  . feeding supplement (ENSURE ENLIVE)  237 mL Oral BID BM  . finasteride  5 mg Oral Daily  . furosemide  40 mg Intravenous Daily  . insulin aspart  0-5 Units Subcutaneous QHS  . insulin aspart  0-9 Units Subcutaneous TID WC  . Ipratropium-Albuterol  1 puff Inhalation QID  . methylPREDNISolone (SOLU-MEDROL) injection  60 mg Intravenous Q12H  . pantoprazole  40 mg Oral Daily  . sodium chloride flush  3 mL Intravenous Q12H   Continuous Infusions:   LOS: 11 days    Time spent: 35 minutes    Barb Merino, MD [facility] Pager [phone]

## 2019-12-05 NOTE — Progress Notes (Signed)
Patient's heart rate continues to remain above 111. He is avg. between 111 and 130 when doing his exercise bands. The night nurse reported he did the same throughtout the night. He is maintaining 82 - 91 O2 saturation. does not report experiencing any type of discomfort other than the breathing. Recommendations requested from LIP.Marland Kitchen

## 2019-12-05 DEATH — deceased

## 2019-12-06 ENCOUNTER — Inpatient Hospital Stay (HOSPITAL_COMMUNITY): Payer: Medicare Other

## 2019-12-06 DIAGNOSIS — U071 COVID-19: Secondary | ICD-10-CM | POA: Diagnosis not present

## 2019-12-06 DIAGNOSIS — J9601 Acute respiratory failure with hypoxia: Secondary | ICD-10-CM | POA: Diagnosis not present

## 2019-12-06 LAB — CBC WITH DIFFERENTIAL/PLATELET
Abs Immature Granulocytes: 1.01 10*3/uL — ABNORMAL HIGH (ref 0.00–0.07)
Basophils Absolute: 0.1 10*3/uL (ref 0.0–0.1)
Basophils Relative: 0 %
Eosinophils Absolute: 0 10*3/uL (ref 0.0–0.5)
Eosinophils Relative: 0 %
HCT: 41.8 % (ref 39.0–52.0)
Hemoglobin: 13.9 g/dL (ref 13.0–17.0)
Immature Granulocytes: 3 %
Lymphocytes Relative: 2 %
Lymphs Abs: 0.6 10*3/uL — ABNORMAL LOW (ref 0.7–4.0)
MCH: 30.9 pg (ref 26.0–34.0)
MCHC: 33.3 g/dL (ref 30.0–36.0)
MCV: 92.9 fL (ref 80.0–100.0)
Monocytes Absolute: 1.2 10*3/uL — ABNORMAL HIGH (ref 0.1–1.0)
Monocytes Relative: 4 %
Neutro Abs: 27.1 10*3/uL — ABNORMAL HIGH (ref 1.7–7.7)
Neutrophils Relative %: 91 %
Platelets: 293 10*3/uL (ref 150–400)
RBC: 4.5 MIL/uL (ref 4.22–5.81)
RDW: 12.3 % (ref 11.5–15.5)
WBC: 30 10*3/uL — ABNORMAL HIGH (ref 4.0–10.5)
nRBC: 0 % (ref 0.0–0.2)

## 2019-12-06 LAB — COMPREHENSIVE METABOLIC PANEL
ALT: 62 U/L — ABNORMAL HIGH (ref 0–44)
AST: 26 U/L (ref 15–41)
Albumin: 2.8 g/dL — ABNORMAL LOW (ref 3.5–5.0)
Alkaline Phosphatase: 66 U/L (ref 38–126)
Anion gap: 9 (ref 5–15)
BUN: 42 mg/dL — ABNORMAL HIGH (ref 8–23)
CO2: 25 mmol/L (ref 22–32)
Calcium: 8.9 mg/dL (ref 8.9–10.3)
Chloride: 98 mmol/L (ref 98–111)
Creatinine, Ser: 0.98 mg/dL (ref 0.61–1.24)
GFR calc Af Amer: >60 mL/min (ref >60)
GFR calc non Af Amer: >60 mL/min (ref >60)
Glucose, Bld: 145 mg/dL — ABNORMAL HIGH (ref 70–99)
Potassium: 5.3 mmol/L — ABNORMAL HIGH (ref 3.5–5.1)
Sodium: 132 mmol/L — ABNORMAL LOW (ref 135–145)
Total Bilirubin: 1 mg/dL (ref 0.3–1.2)
Total Protein: 6.5 g/dL (ref 6.5–8.1)

## 2019-12-06 LAB — C-REACTIVE PROTEIN: CRP: 10 mg/dL — ABNORMAL HIGH (ref <1.0)

## 2019-12-06 LAB — PROCALCITONIN: Procalcitonin: <0.1 ng/mL

## 2019-12-06 LAB — D-DIMER, QUANTITATIVE: D-Dimer, Quant: 1.28 ug/mL-FEU — ABNORMAL HIGH (ref 0.00–0.50)

## 2019-12-06 LAB — FERRITIN: Ferritin: 1299 ng/mL — ABNORMAL HIGH (ref 24–336)

## 2019-12-06 NOTE — Progress Notes (Signed)
PROGRESS NOTE    Ronald Olsen  BJY:[id number] DOB: 08/25/1950 DOA: 11/21/2019 PCP: Pcp, No    Brief Narrative: 69 year old male with past medical history for hypertension, dyslipidemia who presented with dyspnea, malaise, fatigue and cough for about 10 days. Acute worsening of his symptoms over the last 72 hours prior to hospitalization. Multiple sick contacts at home, he has not been vaccinated. Due to severe symptoms he called EMS, his oxygen saturation was in the 70s and he was placed on a 10 L nonrebreather mask and transported to the hospital. On his initial physical examination he was afebrile, but tachycardic and tachypneic, requiring 6 L/min of supplemental oxygen per nasal cannula. His lungs had no wheezing but positive tachypnea, heart S1-S2, present, tachycardic, abdomen soft, no lower extremity edema. Chest radiograph with bilateral infiltrates at the lower lobes, peripherally, more left than right.  He has been placed on aggressive medical therapy with high dose systemic steroids, IV remdesivir and oral baricitinib. Patient with persistent high oxygen requirements despite maximum therapies. Assessment & Plan:   Principal Problem:   Acute hypoxemic respiratory failure due to COVID-19 [facility]) Active Problems:   CAD (coronary artery disease)   Essential hypertension   Hyponatremia   Hyperlipidemia  Acute hypoxemic respiratory failure due to COVID-19 virus pneumonia: Continue to monitor due to significant symptoms, remains on significantly high oxygen requirement. chest physiotherapy, incentive spirometry, deep breathing exercises, sputum induction, mucolytic's and bronchodilators. Mobility as tolerated. Supplemental oxygen to keep saturations more than 82 %. Covid directed therapy with , steroids, remains on high-dose Solu-Medrol remdesivir, finished 5 days of therapy actemra, on baricitinib day 13/14 Due to severity of symptoms, patient will need daily  inflammatory markers, chest x-rays, liver function test to monitor and direct COVID-19 therapies. CTA 9/29 with extensive pneumonia, no PE. Trying Lasix. Chest x-ray today ordered and reviewed, shows extensive bilateral parenchymal damage.  COVID-19 Labs  Recent Labs    12/04/19 0338 12/05/19 0335 12/06/19 0543  DDIMER 1.72* 1.64* 1.28*  FERRITIN 916* 1,091* 1,299*  CRP 13.6* 7.36* 10.0*    Lab Results  Component Value Date   SARSCOV2NAA POSITIVE (A) 11/25/2019   SpO2: 90 % O2 Flow Rate (L/min): 15 L/min FiO2 (%): 100 %  Hypertension: Blood pressure stable.  History of coronary artery disease: On dual antiplatelet therapy with aspirin and Plavix. Continue.  Acute kidney injury on chronic kidney disease stage II with hyponatremia and non-anion gap metabolic acidosis and hyperkalemia: Renal function stable.  Type 2 diabetes: On sliding scale insulin while on steroids.  Leukocytosis: High WBC count, trending down.  Procalcitonin normal.  No evidence of superadded bacterial infection.    DVT prophylaxis: Lovenox   Code Status: Full code Family Communication: Wife on the phone call and updated. Disposition Plan: Status is: Inpatient  Remains inpatient appropriate because:Inpatient level of care appropriate due to severity of illness   Dispo:  Patient From: Home  Planned Disposition: Home with Health Care Svc  Expected discharge date: 12/08/19  Medically stable for discharge: No          Consultants:   None  Procedures:   None  Antimicrobials:   Remdesivir, finished therapy   Subjective: Patient seen and examined.  No overnight events.  Remains on high flow oxygen, he is on 15 L high flow as well as on nonrebreather. In the morning rounds, helped him from bed to chair.  Patient became acutely hypoxic and desatted to 60 with minimal mobility. It took him about 15 minutes  to improve his oxygen to 86% after mobility. Remains in very poor overall  clinical situation.  Discussed about possible need for heated high flow and transfer to progressive unit.  Also discussed about need for intubation may arrive. Patient desired to be intubated if needed.  Objective: Vitals:   12/06/19 0920 12/06/19 1000 12/06/19 1210 12/06/19 1416  BP:  109/63 (!) 117/102 114/71  Pulse: (!) 117 94 (!) 123 (!) 125  Resp: (!) 23 20 15 18   Temp:  98.1 F (36.7 C) 98.5 F (36.9 C) 99.2 F (37.3 C)  TempSrc:  Oral Oral   SpO2: (!) 83% 92% 92% 90%  Weight:      Height:        Intake/Output Summary (Last 24 hours) at 12/06/2019 1423 Last data filed at 12/06/2019 0900 Gross per 24 hour  Intake 120 ml  Output 800 ml  Net -680 ml   Filed Weights   12/04/2019 1418  Weight: 99.8 kg    Examination:  General exam: In moderate respiratory distress, hypoxic and tachypneic on mobility. Respiratory system: No added sounds.  No air entry at the bases. Cardiovascular system: Tachycardic.  S1-S2 heard. Gastrointestinal system: Abdomen is nondistended, soft and nontender. No organomegaly or masses felt. Normal bowel sounds heard. Central nervous system: Alert and oriented. No focal neurological deficits. Extremities: Symmetric 5 x 5 power. Skin: No rashes, lesions or ulcers Psychiatry: Judgement and insight appear normal. Mood & affect anxious.    Data Reviewed: I have personally reviewed following labs and imaging studies  CBC: Recent Labs  Lab 12/03/19 1013 12/05/19 0335 12/06/19 0543  WBC 28.6* 39.1* 30.0*  NEUTROABS 23.9* 35.6* 27.1*  HGB 14.8 14.3 13.9  HCT 43.2 42.2 41.8  MCV 90.8 91.9 92.9  PLT 400 338 366   Basic Metabolic Panel: Recent Labs  Lab 12/02/19 0344 12/03/19 0335 12/04/19 0338 12/05/19 0335 12/06/19 0543  NA 133* 134* 131* 130* 132*  K 5.2* 4.8 5.0 5.1 5.3*  CL 101 100 98 98 98  CO2 24 26 23  21* 25  GLUCOSE 134* 95 157* 143* 145*  BUN 48* 45* 48* 45* 42*  CREATININE 1.24 1.19 1.19 1.04 0.98  CALCIUM 8.8* 8.7* 8.8*  8.7* 8.9   GFR: Estimated Creatinine Clearance: 88 mL/min (by C-G formula based on SCr of 0.98 mg/dL). Liver Function Tests: Recent Labs  Lab 12/02/19 0344 12/03/19 0335 12/04/19 0338 12/05/19 0335 12/06/19 0543  AST 24 26 25  33 26  ALT 38 41 41 54* 62*  ALKPHOS 57 61 68 63 66  BILITOT 0.6 0.8 0.9 0.9 1.0  PROT 6.2* 6.2* 6.6 6.3* 6.5  ALBUMIN 3.0* 3.0* 3.0* 2.8* 2.8*   No results for input(s): LIPASE, AMYLASE in the last 168 hours. No results for input(s): AMMONIA in the last 168 hours. Coagulation Profile: No results for input(s): INR, PROTIME in the last 168 hours. Cardiac Enzymes: No results for input(s): CKTOTAL, CKMB, CKMBINDEX, TROPONINI in the last 168 hours. BNP (last 3 results) No results for input(s): PROBNP in the last 8760 hours. HbA1C: No results for input(s): HGBA1C in the last 72 hours. CBG: Recent Labs  Lab 12/03/19 1614 12/03/19 2219 12/04/19 0753 12/04/19 1143 12/04/19 1651  GLUCAP 115* 175* 152* 185* 178*   Lipid Profile: No results for input(s): CHOL, HDL, LDLCALC, TRIG, CHOLHDL, LDLDIRECT in the last 72 hours. Thyroid Function Tests: No results for input(s): TSH, T4TOTAL, FREET4, T3FREE, THYROIDAB in the last 72 hours. Anemia Panel: Recent Labs    12/05/19  0335 12/06/19 0543  FERRITIN 1,091* 1,299*   Sepsis Labs: Recent Labs  Lab 12/05/19 0335 12/06/19 0543  PROCALCITON <0.10 <0.10    No results found for this or any previous visit (from the past 240 hour(s)).       Radiology Studies: DG CHEST PORT 1 VIEW  Result Date: 12/06/2019 CLINICAL DATA:  Shortness of breath EXAM: PORTABLE CHEST 1 VIEW COMPARISON:  November 27, 2019, December 03, 2019 FINDINGS: The cardiomediastinal silhouette is unchanged in contour. No pleural effusion. No pneumothorax. Increased diffuse bilateral bilateral peripheral predominant heterogeneous opacities since November 27, 2019, consistent with the sequela of COVID-19 infection. Visualized abdomen is  unremarkable. Mild degenerative changes of the thoracic spine. IMPRESSION: Increased diffuse bilateral peripheral predominant heterogeneous opacities, consistent with the sequela of COVID-19 infection. Electronically Signed   By: Valentino Saxon MD   On: 12/06/2019 10:22        Scheduled Meds: . alfuzosin  10 mg Oral Q breakfast  . aspirin EC  81 mg Oral Daily  . atorvastatin  40 mg Oral Daily  . baricitinib  4 mg Oral Daily  . chlorpheniramine-HYDROcodone  5 mL Oral Q12H  . clopidogrel  75 mg Oral Daily  . cyclobenzaprine  10 mg Oral BID  . enoxaparin (LOVENOX) injection  40 mg Subcutaneous Q24H  . feeding supplement (ENSURE ENLIVE)  237 mL Oral BID BM  . finasteride  5 mg Oral Daily  . furosemide  40 mg Intravenous Daily  . insulin aspart  0-5 Units Subcutaneous QHS  . insulin aspart  0-9 Units Subcutaneous TID WC  . Ipratropium-Albuterol  1 puff Inhalation QID  . methylPREDNISolone (SOLU-MEDROL) injection  60 mg Intravenous Q12H  . pantoprazole  40 mg Oral Daily  . sodium chloride flush  3 mL Intravenous Q12H   Continuous Infusions:   LOS: 12 days    Time spent: 35 minutes    Barb Merino, MD [facility] Pager [phone]

## 2019-12-06 NOTE — Plan of Care (Signed)
Patient is alert and oriented. Patient had difficulty recovering from de-sating while up in the chair. Patient placed back in the bed and his O2 saturation went up to 97 and has maintained between 90-97 for the remainder of the shift. Patient refused bath and stated he would be willing to receive his bath doing the night hours. Patient has been calm, cooperative, and very responsive today. He continues to experience pain when coughing but reports that it has improved. Patient at all of his meals. Will report to [facility] RN  Problem: Clinical Measurements: Goal: Respiratory complications will improve Outcome: Not Progressing

## 2019-12-06 NOTE — Progress Notes (Signed)
Vitals rechecked after assisting the patient and educating

## 2019-12-06 NOTE — Progress Notes (Signed)
Occupational Therapy Treatment Patient Details Name: Ronald Olsen MRN: [id number] DOB: 11/03/1950 Today's Date: 12/06/2019    History of present illness Patient is a 69 year old male past medical history for hypertension, dyslipidemia who presents with dyspnea, malaise, fatigue and cough for about 10 days. Patient diagnosed with COVID   OT comments  Patient showing slow progress due to now requiring 100% FiO2 via HFNC as well as NRM and continued to desaturate to 81-82% with light bed level exercises.  Pt showed improved paced breathing with theraband BUE exercises and progressed to bridging in bed with pt was able to demo without assistance x7 reps.  Patient limited by poor activity tolerance and dependence on supplemental O2, along with deficits noted below. Pt continues to demonstrate fair to good rehab potential and would benefit from continued skilled OT to increase safety and independence with ADLs and functional transfers to allow pt to return home safely and reduce caregiver burden and fall risk.   Follow Up Recommendations  No OT follow up    Equipment Recommendations  Tub/shower seat    Recommendations for Other Services      Precautions / Restrictions Precautions Precautions: Fall Precaution Comments: monitor vitals, HFNC and partial NRB now at 100% FiO2 Restrictions Weight Bearing Restrictions: No                                                                ADL either performed or assessed with clinical judgement   ADL Overall ADL's : Needs assistance/impaired                                       General ADL Comments: Pt was seen at bed level this session due to resting SpO2 staying mid 80s and dropping to low 80s (81-82%) with light, bed level exercises. Pre-ADL activities prioritized.     Vision Baseline Vision/History: Wears glasses Wears Glasses: At all times Patient Visual Report: No change from baseline      Perception     Praxis      Cognition Arousal/Alertness: Awake/alert Behavior During Therapy: WFL for tasks assessed/performed Overall Cognitive Status: Within Functional Limits for tasks assessed            General Comments: Calm this session, and participating fully. asking questions about breathing during exercises, very engaged.        Exercises Shoulder Exercises Shoulder Flexion: Theraband;Both;5 reps;Supine Theraband Level (Shoulder Flexion): Level 2 (Red) Elbow Flexion: Both;10 reps;Supine;Theraband Theraband Level (Elbow Flexion): Level 2 (Red) Other Exercises Other Exercises: ER low, horizonal adduction:  BUEs x10 reps with med-ligh T-Band in supine. Other Exercises: Pt performed 7 reps bed level bridging with 1 second hold each rep. Other Exercises: Pt educated on PLB and diaphramatic breathing. Pt educated to use his own smart phone for [employer] on diaphramatic breathing to practice on his own. Pt educated on pacing breathing during exercises and able to demo back after practice.   Shoulder Instructions       General Comments      Pertinent Vitals/ Pain       Pain Assessment: No/denies pain  Home Living  Prior Functioning/Environment              Frequency  Min 2X/week        Progress Toward Goals  OT Goals(current goals can now be found in the care plan section)  Progress towards OT goals: Progressing toward goals  Acute Rehab OT Goals Patient Stated Goal: go home OT Goal Formulation: With patient Time For Goal Achievement: 12/16/19 Potential to Achieve Goals: [place] Discharge plan remains appropriate    Co-evaluation                 AM-PAC OT "6 Clicks" Daily Activity     Outcome Measure   Help from another person eating meals?: None Help from another person taking care of personal grooming?: A Little Help from another person toileting, which includes  using toliet, bedpan, or urinal?: A Little Help from another person bathing (including washing, rinsing, drying)?: A Little Help from another person to put on and taking off regular upper body clothing?: A Little Help from another person to put on and taking off regular lower body clothing?: A Little 6 Click Score: 19    End of Session Equipment Utilized During Treatment: Oxygen  OT Visit Diagnosis: Muscle weakness (generalized) (M62.81);Other abnormalities of gait and mobility (R26.89)   Activity Tolerance Patient limited by fatigue   Patient Left in bed;with call bell/phone within reach;with bed alarm set   Nurse Communication Other (comment) (Vitals and pt responses)        Time: 5053-9767 OT Time Calculation (min): 28 min  Charges: OT General Charges $OT Visit: 1 Visit OT Treatments $Therapeutic Exercise: 23-37 mins  Anderson [place], [place] Office: [phone] 12/06/2019    Julien Girt 12/06/2019, 3:32 PM

## 2019-12-07 ENCOUNTER — Inpatient Hospital Stay (HOSPITAL_COMMUNITY): Payer: Medicare Other

## 2019-12-07 DIAGNOSIS — J9601 Acute respiratory failure with hypoxia: Secondary | ICD-10-CM | POA: Diagnosis not present

## 2019-12-07 DIAGNOSIS — U071 COVID-19: Secondary | ICD-10-CM | POA: Diagnosis not present

## 2019-12-07 LAB — COMPREHENSIVE METABOLIC PANEL
ALT: 80 U/L — ABNORMAL HIGH (ref 0–44)
AST: 32 U/L (ref 15–41)
Albumin: 2.8 g/dL — ABNORMAL LOW (ref 3.5–5.0)
Alkaline Phosphatase: 70 U/L (ref 38–126)
Anion gap: 12 (ref 5–15)
BUN: 57 mg/dL — ABNORMAL HIGH (ref 8–23)
CO2: 26 mmol/L (ref 22–32)
Calcium: 9.2 mg/dL (ref 8.9–10.3)
Chloride: 96 mmol/L — ABNORMAL LOW (ref 98–111)
Creatinine, Ser: 1.06 mg/dL (ref 0.61–1.24)
GFR calc Af Amer: >60 mL/min (ref >60)
GFR calc non Af Amer: >60 mL/min (ref >60)
Glucose, Bld: 126 mg/dL — ABNORMAL HIGH (ref 70–99)
Potassium: 5 mmol/L (ref 3.5–5.1)
Sodium: 134 mmol/L — ABNORMAL LOW (ref 135–145)
Total Bilirubin: 0.8 mg/dL (ref 0.3–1.2)
Total Protein: 6.8 g/dL (ref 6.5–8.1)

## 2019-12-07 LAB — MRSA PCR SCREENING: MRSA by PCR: NEGATIVE

## 2019-12-07 LAB — CBC WITH DIFFERENTIAL/PLATELET
Abs Immature Granulocytes: 0.96 10*3/uL — ABNORMAL HIGH (ref 0.00–0.07)
Basophils Absolute: 0.1 10*3/uL (ref 0.0–0.1)
Basophils Relative: 0 %
Eosinophils Absolute: 0 10*3/uL (ref 0.0–0.5)
Eosinophils Relative: 0 %
HCT: 44.1 % (ref 39.0–52.0)
Hemoglobin: 14.5 g/dL (ref 13.0–17.0)
Immature Granulocytes: 3 %
Lymphocytes Relative: 2 %
Lymphs Abs: 0.6 10*3/uL — ABNORMAL LOW (ref 0.7–4.0)
MCH: 30.7 pg (ref 26.0–34.0)
MCHC: 32.9 g/dL (ref 30.0–36.0)
MCV: 93.4 fL (ref 80.0–100.0)
Monocytes Absolute: 1.7 10*3/uL — ABNORMAL HIGH (ref 0.1–1.0)
Monocytes Relative: 5 %
Neutro Abs: 32.2 10*3/uL — ABNORMAL HIGH (ref 1.7–7.7)
Neutrophils Relative %: 90 %
Platelets: 301 10*3/uL (ref 150–400)
RBC: 4.72 MIL/uL (ref 4.22–5.81)
RDW: 12.6 % (ref 11.5–15.5)
WBC: 35.6 10*3/uL — ABNORMAL HIGH (ref 4.0–10.5)
nRBC: 0 % (ref 0.0–0.2)

## 2019-12-07 LAB — C-REACTIVE PROTEIN
CRP: 9.5 mg/dL — ABNORMAL HIGH (ref <1.0)
CRP: 9.1 mg/dL — ABNORMAL HIGH (ref <1.0)

## 2019-12-07 LAB — D-DIMER, QUANTITATIVE: D-Dimer, Quant: 1.39 ug/mL-FEU — ABNORMAL HIGH (ref 0.00–0.50)

## 2019-12-07 LAB — GLUCOSE, CAPILLARY: Glucose-Capillary: 174 mg/dL — ABNORMAL HIGH (ref 70–99)

## 2019-12-07 LAB — FERRITIN: Ferritin: 1745 ng/mL — ABNORMAL HIGH (ref 24–336)

## 2019-12-07 MED ORDER — AZITHROMYCIN 250 MG PO TABS
500.0000 mg | ORAL_TABLET | Freq: Every day | ORAL | Status: DC
  Administered 2019-12-07 – 2019-12-09 (×3): 500 mg via ORAL
  Filled 2019-12-07 (×3): qty 2

## 2019-12-07 MED ORDER — METHYLPREDNISOLONE SODIUM SUCC 40 MG IJ SOLR
40.0000 mg | Freq: Two times a day (BID) | INTRAMUSCULAR | Status: DC
  Administered 2019-12-07 – 2019-12-10 (×5): 40 mg via INTRAVENOUS
  Filled 2019-12-07 (×6): qty 1

## 2019-12-07 MED ORDER — VANCOMYCIN HCL 2000 MG/400ML IV SOLN
2000.0000 mg | Freq: Once | INTRAVENOUS | Status: AC
  Administered 2019-12-07: 2000 mg via INTRAVENOUS
  Filled 2019-12-07: qty 400

## 2019-12-07 MED ORDER — SODIUM CHLORIDE 0.9 % IV SOLN
2.0000 g | INTRAVENOUS | Status: DC
  Administered 2019-12-07 – 2019-12-08 (×2): 2 g via INTRAVENOUS
  Filled 2019-12-07: qty 2
  Filled 2019-12-07: qty 20

## 2019-12-07 MED ORDER — VANCOMYCIN HCL 750 MG/150ML IV SOLN
750.0000 mg | Freq: Two times a day (BID) | INTRAVENOUS | Status: DC
  Administered 2019-12-07 – 2019-12-08 (×2): 750 mg via INTRAVENOUS
  Filled 2019-12-07 (×2): qty 150

## 2019-12-07 MED ORDER — SODIUM CHLORIDE 0.9 % IV BOLUS
500.0000 mL | Freq: Once | INTRAVENOUS | Status: AC
  Administered 2019-12-07: 500 mL via INTRAVENOUS

## 2019-12-07 NOTE — Progress Notes (Signed)
Noted Chest x-ray has been ordered.

## 2019-12-07 NOTE — Progress Notes (Signed)
Patient reevaluated with Red mews due to low bp, increased HR and increased RR  Exam: Patient looks and feels about the same. On high flow oxygen. Trying to eat dinner. BP responded to 250 ml fluid. CXR ordered and reviewed, shows more infiltrate on right side.  Patient is maximized on therapeutics, broadened antibiotics today. May need heated high flow, transfer to progressive care unit. Continue supportive care. Patient not needing any vasopressors or intubation yet, he is with intact mentation and able to communicate.

## 2019-12-07 NOTE — Progress Notes (Signed)
PROGRESS NOTE    Ronald Olsen  XKG:[id number] DOB: 10/13/1950 DOA: 11/08/2019 PCP: Pcp, No    Brief Narrative: 69 year old male with past medical history for hypertension, dyslipidemia who presented with dyspnea, malaise, fatigue and cough for about 10 days. Acute worsening of his symptoms over the last 72 hours prior to hospitalization. Multiple sick contacts at home, he has not been vaccinated. Due to severe symptoms he called EMS, his oxygen saturation was in the 70s and he was placed on a 10 L nonrebreather mask and transported to the hospital. On his initial physical examination he was afebrile, but tachycardic and tachypneic, requiring 6 L/min of supplemental oxygen per nasal cannula. His lungs had no wheezing but positive tachypnea, heart S1-S2, present, tachycardic, abdomen soft, no lower extremity edema. Chest radiograph with bilateral infiltrates at the lower lobes, peripherally, more left than right.  He has been placed on aggressive medical therapy with high dose systemic steroids, IV remdesivir and oral baricitinib. Patient with persistent high oxygen requirements despite maximum therapies. 10/1-10/3: Persistent high requirement of oxygen.  WBC count more than 35,000 with left shift.  Procalcitonin less than 0.1.  Repeat chest x-ray with persistent consolidation.  Assessment & Plan:   Principal Problem:   Acute hypoxemic respiratory failure due to COVID-19 [facility]) Active Problems:   CAD (coronary artery disease)   Essential hypertension   Hyponatremia   Hyperlipidemia  Acute hypoxemic respiratory failure due to COVID-19 virus pneumonia: Continue to monitor due to significant symptoms, remains on significantly high oxygen requirement. chest physiotherapy, incentive spirometry, deep breathing exercises, sputum induction, mucolytic's and bronchodilators. Mobility as tolerated. Supplemental oxygen to keep saturations more than 82 %. Covid directed therapy with  , steroids, remains on high-dose Solu-Medrol, will decrease dose to 40 mg twice a day. remdesivir, finished 5 days of therapy actemra, on baricitinib day 14 of 14.  Completed therapy today. Due to severity of symptoms, patient will need daily inflammatory markers, chest x-rays, liver function test to monitor and direct COVID-19 therapies. CTA 9/29 with extensive pneumonia, no PE. Trying Lasix. Chest x-ray shows extensive bilateral parenchymal damage. 10/3, with persistent worsening hypoxemia, worsening inflammatory markers and WBC count severely elevated.  Though his procalcitonin levels are low, will treat for superadded bacterial infection with broad-spectrum antibiotics to cover for post viral pneumonia with vancomycin, Rocephin and azithromycin. Sputum cultures. MRSA swab.  COVID-19 Labs  Recent Labs    12/05/19 0335 12/06/19 0543 12/07/19 0524  DDIMER 1.64* 1.28* 1.39*  FERRITIN 1,091* 1,299* 1,745*  CRP 7.36* 10.0* 9.5*    Lab Results  Component Value Date   SARSCOV2NAA POSITIVE (A) 11/11/2019   SpO2: (!) 88 % O2 Flow Rate (L/min): 15 L/min (plus 15 lpm nrb) FiO2 (%): 100 %  Hypertension: Blood pressure stable.  History of coronary artery disease: On dual antiplatelet therapy with aspirin and Plavix. Continue.  Acute kidney injury on chronic kidney disease stage II with hyponatremia and non-anion gap metabolic acidosis and hyperkalemia: Renal function stable.  Type 2 diabetes: On sliding scale insulin while on steroids.  DVT prophylaxis: Lovenox   Code Status: Full code Family Communication: Wife on the phone call and updated. Disposition Plan: Status is: Inpatient  Remains inpatient appropriate because:Inpatient level of care appropriate due to severity of illness   Dispo:  Patient From: Home  Planned Disposition: Home with Health Care Svc  Expected discharge date: 12/10/2019  medically stable for discharge: No          Consultants:  None  Procedures:   None  Antimicrobials:   Remdesivir, finished therapy  .  Rocephin 10/3--   Vancomycin 10/3 ---  Azithromycin 10/3 ---   Subjective: Patient seen and examined.  Overnight he did fairly okay at rest.  Does not tolerate any activities.  Occasionally loses breath even on turning sideways in the bed.  Denies any chest pain.  Unable to take deep breaths.  Objective: Vitals:   12/07/19 0754 12/07/19 1138 12/07/19 1147 12/07/19 1340  BP: 93/64 105/62  (!) 124/101  Pulse: 95 (!) 123 (!) 122 (!) 119  Resp: 14 (!) 22 (!) 21 16  Temp: (!) 97.5 F (36.4 C) 98.1 F (36.7 C)  97.7 F (36.5 C)  TempSrc: Oral     SpO2: (!) 87% (!) 85% (!) 83% (!) 88%  Weight:      Height:        Intake/Output Summary (Last 24 hours) at 12/07/2019 1351 Last data filed at 12/07/2019 1118 Gross per 24 hour  Intake 480 ml  Output 1400 ml  Net -920 ml   Filed Weights   11/10/2019 1418  Weight: 99.8 kg    Examination:  General exam: In moderate respiratory distress, hypoxic and tachypneic on mobility.  Anxious. Respiratory system: No added sounds.  No air entry at the bases. Cardiovascular system: No added sounds.  S1-S2 heard.  Tachycardic. Gastrointestinal system: Abdomen is nondistended, soft and nontender. No organomegaly or masses felt. Normal bowel sounds heard. Central nervous system: Alert and oriented. No focal neurological deficits. Extremities: Symmetric 5 x 5 power. Skin: No rashes, lesions or ulcers Psychiatry: Judgement and insight appear normal. Mood & affect anxious.    Data Reviewed: I have personally reviewed following labs and imaging studies  CBC: Recent Labs  Lab 12/03/19 1013 12/05/19 0335 12/06/19 0543 12/07/19 0524  WBC 28.6* 39.1* 30.0* 35.6*  NEUTROABS 23.9* 35.6* 27.1* 32.2*  HGB 14.8 14.3 13.9 14.5  HCT 43.2 42.2 41.8 44.1  MCV 90.8 91.9 92.9 93.4  PLT 400 338 293 831   Basic Metabolic Panel: Recent Labs  Lab 12/03/19 0335  12/04/19 0338 12/05/19 0335 12/06/19 0543 12/07/19 0524  NA 134* 131* 130* 132* 134*  K 4.8 5.0 5.1 5.3* 5.0  CL 100 98 98 98 96*  CO2 26 23 21* 25 26  GLUCOSE 95 157* 143* 145* 126*  BUN 45* 48* 45* 42* 57*  CREATININE 1.19 1.19 1.04 0.98 1.06  CALCIUM 8.7* 8.8* 8.7* 8.9 9.2   GFR: Estimated Creatinine Clearance: 81.4 mL/min (by C-G formula based on SCr of 1.06 mg/dL). Liver Function Tests: Recent Labs  Lab 12/03/19 0335 12/04/19 0338 12/05/19 0335 12/06/19 0543 12/07/19 0524  AST 26 25 33 26 32  ALT 41 41 54* 62* 80*  ALKPHOS 61 68 63 66 70  BILITOT 0.8 0.9 0.9 1.0 0.8  PROT 6.2* 6.6 6.3* 6.5 6.8  ALBUMIN 3.0* 3.0* 2.8* 2.8* 2.8*   No results for input(s): LIPASE, AMYLASE in the last 168 hours. No results for input(s): AMMONIA in the last 168 hours. Coagulation Profile: No results for input(s): INR, PROTIME in the last 168 hours. Cardiac Enzymes: No results for input(s): CKTOTAL, CKMB, CKMBINDEX, TROPONINI in the last 168 hours. BNP (last 3 results) No results for input(s): PROBNP in the last 8760 hours. HbA1C: No results for input(s): HGBA1C in the last 72 hours. CBG: Recent Labs  Lab 12/03/19 1614 12/03/19 2219 12/04/19 0753 12/04/19 1143 12/04/19 1651  GLUCAP 115* 175* 152* 185* 178*  Lipid Profile: No results for input(s): CHOL, HDL, LDLCALC, TRIG, CHOLHDL, LDLDIRECT in the last 72 hours. Thyroid Function Tests: No results for input(s): TSH, T4TOTAL, FREET4, T3FREE, THYROIDAB in the last 72 hours. Anemia Panel: Recent Labs    12/06/19 0543 12/07/19 0524  FERRITIN 1,299* 1,745*   Sepsis Labs: Recent Labs  Lab 12/05/19 0335 12/06/19 0543  PROCALCITON <0.10 <0.10    Recent Results (from the past 240 hour(s))  MRSA PCR Screening     Status: None   Collection Time: 12/07/19  7:35 AM   Specimen: Nasal Mucosa; Nasopharyngeal  Result Value Ref Range Status   MRSA by PCR NEGATIVE NEGATIVE Final    Comment:        The GeneXpert MRSA Assay  (FDA approved for NASAL specimens only), is one component of a comprehensive MRSA colonization surveillance program. It is not intended to diagnose MRSA infection nor to guide or monitor treatment for MRSA infections. Performed at [facility], [place] [address], [place], [place] [address]          Radiology Studies: DG CHEST PORT 1 VIEW  Result Date: 12/06/2019 CLINICAL DATA:  Shortness of breath EXAM: PORTABLE CHEST 1 VIEW COMPARISON:  November 27, 2019, December 03, 2019 FINDINGS: The cardiomediastinal silhouette is unchanged in contour. No pleural effusion. No pneumothorax. Increased diffuse bilateral bilateral peripheral predominant heterogeneous opacities since November 27, 2019, consistent with the sequela of COVID-19 infection. Visualized abdomen is unremarkable. Mild degenerative changes of the thoracic spine. IMPRESSION: Increased diffuse bilateral peripheral predominant heterogeneous opacities, consistent with the sequela of COVID-19 infection. Electronically Signed   By: Valentino Saxon MD   On: 12/06/2019 10:22        Scheduled Meds: . alfuzosin  10 mg Oral Q breakfast  . aspirin EC  81 mg Oral Daily  . atorvastatin  40 mg Oral Daily  . azithromycin  500 mg Oral Daily  . chlorpheniramine-HYDROcodone  5 mL Oral Q12H  . clopidogrel  75 mg Oral Daily  . cyclobenzaprine  10 mg Oral BID  . enoxaparin (LOVENOX) injection  40 mg Subcutaneous Q24H  . feeding supplement (ENSURE ENLIVE)  237 mL Oral BID BM  . finasteride  5 mg Oral Daily  . furosemide  40 mg Intravenous Daily  . insulin aspart  0-5 Units Subcutaneous QHS  . insulin aspart  0-9 Units Subcutaneous TID WC  . Ipratropium-Albuterol  1 puff Inhalation QID  . methylPREDNISolone (SOLU-MEDROL) injection  40 mg Intravenous Q12H  . pantoprazole  40 mg Oral Daily  . sodium chloride flush  3 mL Intravenous Q12H   Continuous Infusions: . cefTRIAXone (ROCEPHIN)  IV 2 g (12/07/19 1037)  .  vancomycin       LOS: 13 days    Time spent: 35 minutes    Barb Merino, MD [facility] Pager [phone]

## 2019-12-07 NOTE — Progress Notes (Signed)
   12/07/19 1530  Assess: MEWS Score  Temp 98.1 F (36.7 C)  BP (!) 82/49  Pulse Rate (!) 110  Resp (!) 25  SpO2 (!) 86 %  O2 Device HFNC;Non-rebreather Mask  O2 Flow Rate (L/min) 15 L/min (plus 15 NRB)  Assess: MEWS Score  MEWS Temp 0  MEWS Systolic 1  MEWS Pulse 1  MEWS RR 1  MEWS LOC 0  MEWS Score 3  MEWS Score Color Yellow   Pt is still in Yellow MEWS d/t BP: 82/49; HR: 110; RR: 25; SPO2: 86% on 15 L HFNC and 15 L NRB. Pt reports feeling more lethargic. MD aware and placed new orders for 500 mL NS bolus. Will continue to monitor closely.

## 2019-12-07 NOTE — Progress Notes (Signed)
   12/07/19 1138  Assess: MEWS Score  Temp 98.1 F (36.7 C)  BP 105/62  Pulse Rate (!) 123  Resp (!) 22  Level of Consciousness Alert  SpO2 (!) 85 %  O2 Device HFNC;Non-rebreather Mask  O2 Flow Rate (L/min) 15 L/min (plus 15 NRB)  Assess: MEWS Score  MEWS Temp 0  MEWS Systolic 0  MEWS Pulse 2  MEWS RR 1  MEWS LOC 0  MEWS Score 3  MEWS Score Color Yellow  Assess: if the MEWS score is Yellow or Red  Were vital signs taken at a resting state? Yes  Focused Assessment No change from prior assessment  Early Detection of Sepsis Score *See Row Information* High  MEWS guidelines implemented *See Row Information* Yes  Treat  MEWS Interventions Other (Comment) (will continue to monitor closely per MD; turn pt on side;)  Pain Scale 0-10  Pain Score 0  Take Vital Signs  Increase Vital Sign Frequency  Yellow: Q 2hr X 2 then Q 4hr X 2, if remains yellow, continue Q 4hrs  Escalate  MEWS: Escalate Yellow: discuss with charge nurse/RN and consider discussing with provider and RRT  Notify: Charge Nurse/RN  Name of Charge Nurse/RN Notified Wendy, RN  Date Charge Nurse/RN Notified 12/07/19  Time Charge Nurse/RN Notified 1200  Notify: Provider  Provider Name/Title Ghimire  Date Provider Notified 12/07/19  Time Provider Notified 1240  Notification Type Page  Notification Reason Change in status  Response Other (Comment) (will continue to monitor closely)  Date of Provider Response 12/07/19  Time of Provider Response 1240  Document  Patient Outcome Other (Comment) (will continue to monitor closely)  Progress note created (see row info) Yes   Pt is in the Yellow MEWS d/t HR: 123; RR: 22; SPO2: 83% on 15 L HFNC and 15 L NRB. Paged MD and informed to monitor the pt closely and place pt on his side when possible to increase oxygenation. Administered prn xanax 1mg  PO at 1011 . Will implement Yellow MEWS to monitor pt closely.

## 2019-12-07 NOTE — Progress Notes (Signed)
Pharmacy Antibiotic Note  Ronald Olsen is a 69 y.o. male admitted on 12/01/2019 with Covid pneumonia.  Pharmacy has been consulted for vancomcyin dosing for PNA.  Tm 99.2 10/2 PCT negative WBC 35.6 on solumedrol 60 q12 CRP up to 10 yesterday, results pending today SCr 1.06 Day #14/14 barictinib for Covid  Plan: Vancomycin 2 gm loading dose Vancomycin 750 IV every 12 hours.  Goal trough 15-20 mcg/mL.   AUC 468 Vd 0.5 Ceftriaxone 2 gm IV q24 x 5 days per MD F/u MRSA PCR F/u renal fxn, WBC, temp. Culture data Vancomycin levels as needed  Height: 5\' 11"  (180.3 cm) Weight: 99.8 kg (220 lb) IBW/kg (Calculated) : 75.3  Temp (24hrs), Avg:98.3 F (36.8 C), Min:97.8 F (36.6 C), Max:99.2 F (37.3 C)  Recent Labs  Lab 12/03/19 0335 12/03/19 1013 12/04/19 0338 12/05/19 0335 12/06/19 0543 12/07/19 0524  WBC  --  28.6*  --  39.1* 30.0* 35.6*  CREATININE 1.19  --  1.19 1.04 0.98 1.06    Estimated Creatinine Clearance: 81.4 mL/min (by C-G formula based on SCr of 1.06 mg/dL).    Allergies  Allergen Reactions  . Amoxicillin Other (See Comments)    "Cloudy headed" with long doses   Antimicrobials this admission:  9/20 Remdesivir  > 9/24 9/20 Baricitinib >10/3 10/3 vanc>> 10/3 CTX>  (10/7) Dose adjustments this admission:   Microbiology results:  9/20 BCx x2: NGF 9/20 COVID+ 10/3 MRSA PCR ordered 10/3 sputum ordered  Thank you for allowing pharmacy to be a part of this patient's care.  Eudelia Bunch, Pharm.D 12/07/2019 7:59 AM

## 2019-12-07 NOTE — Progress Notes (Signed)
   12/07/19 1755  Assess: MEWS Score  Temp 98.7 F (37.1 C)  BP 111/71  Pulse Rate (!) 113  Resp (!) 26  Level of Consciousness Alert  SpO2 (!) 86 %  O2 Device HFNC;Non-rebreather Mask  O2 Flow Rate (L/min) 15 L/min (plus 15 NRB)  Assess: MEWS Score  MEWS Temp 0  MEWS Systolic 0  MEWS Pulse 2  MEWS RR 2  MEWS LOC 0  MEWS Score 4  MEWS Score Color Red  Assess: if the MEWS score is Yellow or Red  Were vital signs taken at a resting state? Yes  Focused Assessment Change from prior assessment (see assessment flowsheet)  Early Detection of Sepsis Score *See Row Information* High  MEWS guidelines implemented *See Row Information* Yes  Treat  MEWS Interventions Other (Comment) (MD at bedside; pt being transferred to progressive unit)  Take Vital Signs  Increase Vital Sign Frequency  Red: Q 1hr X 4 then Q 4hr X 4, if remains red, continue Q 4hrs  Escalate  MEWS: Escalate Red: discuss with charge nurse/RN and provider, consider discussing with RRT  Notify: Charge Nurse/RN  Name of Charge Nurse/RN Notified Wendy, RN  Date Charge Nurse/RN Notified 12/07/19  Time Charge Nurse/RN Notified 1801  Notify: Provider  Provider Name/Title Ghimire  Date Provider Notified 12/07/19  Time Provider Notified 1801  Notification Type Face-to-face  Notification Reason Change in status  Response See new orders  Date of Provider Response 12/07/19  Time of Provider Response 1801  Document  Patient Outcome Other (Comment) (MD at bedside, will tx to progressive unit)  Progress note created (see row info) Yes   Pt is in the Red MEWS d/t HR: 113; RR: 26; SPO2: 86% on 15 L HFNC and 15L NRB; MD at bedside and placed transfer orders to progressive care. Charge RN aware. Will continue to monitor closely until time of transfer. Red MEWS implemented.

## 2019-12-07 NOTE — Progress Notes (Signed)
Alerted MD of pt's increased work of breathing and maintaining SPO2 82%-85% on 15L HFNC and 15L NRB.

## 2019-12-08 ENCOUNTER — Other Ambulatory Visit (HOSPITAL_COMMUNITY): Payer: Medicare Other

## 2019-12-08 LAB — COMPREHENSIVE METABOLIC PANEL
ALT: 82 U/L — ABNORMAL HIGH (ref 0–44)
AST: 37 U/L (ref 15–41)
Albumin: 2.4 g/dL — ABNORMAL LOW (ref 3.5–5.0)
Alkaline Phosphatase: 72 U/L (ref 38–126)
Anion gap: 9 (ref 5–15)
BUN: 53 mg/dL — ABNORMAL HIGH (ref 8–23)
CO2: 25 mmol/L (ref 22–32)
Calcium: 8.7 mg/dL — ABNORMAL LOW (ref 8.9–10.3)
Chloride: 97 mmol/L — ABNORMAL LOW (ref 98–111)
Creatinine, Ser: 1.1 mg/dL (ref 0.61–1.24)
GFR calc Af Amer: >60 mL/min (ref >60)
GFR calc non Af Amer: >60 mL/min (ref >60)
Glucose, Bld: 113 mg/dL — ABNORMAL HIGH (ref 70–99)
Potassium: 5 mmol/L (ref 3.5–5.1)
Sodium: 131 mmol/L — ABNORMAL LOW (ref 135–145)
Total Bilirubin: 0.9 mg/dL (ref 0.3–1.2)
Total Protein: 6.4 g/dL — ABNORMAL LOW (ref 6.5–8.1)

## 2019-12-08 LAB — GLUCOSE, CAPILLARY
Glucose-Capillary: 133 mg/dL — ABNORMAL HIGH (ref 70–99)
Glucose-Capillary: 116 mg/dL — ABNORMAL HIGH (ref 70–99)
Glucose-Capillary: 111 mg/dL — ABNORMAL HIGH (ref 70–99)
Glucose-Capillary: 136 mg/dL — ABNORMAL HIGH (ref 70–99)

## 2019-12-08 LAB — CBC WITH DIFFERENTIAL/PLATELET
Abs Immature Granulocytes: 0.6 10*3/uL — ABNORMAL HIGH (ref 0.00–0.07)
Basophils Absolute: 0.1 10*3/uL (ref 0.0–0.1)
Basophils Relative: 0 %
Eosinophils Absolute: 0 10*3/uL (ref 0.0–0.5)
Eosinophils Relative: 0 %
HCT: 41.2 % (ref 39.0–52.0)
Hemoglobin: 14.1 g/dL (ref 13.0–17.0)
Immature Granulocytes: 2 %
Lymphocytes Relative: 2 %
Lymphs Abs: 0.6 10*3/uL — ABNORMAL LOW (ref 0.7–4.0)
MCH: 31.3 pg (ref 26.0–34.0)
MCHC: 34.2 g/dL (ref 30.0–36.0)
MCV: 91.6 fL (ref 80.0–100.0)
Monocytes Absolute: 1.7 10*3/uL — ABNORMAL HIGH (ref 0.1–1.0)
Monocytes Relative: 5 %
Neutro Abs: 29.2 10*3/uL — ABNORMAL HIGH (ref 1.7–7.7)
Neutrophils Relative %: 91 %
Platelets: 261 10*3/uL (ref 150–400)
RBC: 4.5 MIL/uL (ref 4.22–5.81)
RDW: 12.6 % (ref 11.5–15.5)
WBC: 32.1 10*3/uL — ABNORMAL HIGH (ref 4.0–10.5)
nRBC: 0 % (ref 0.0–0.2)

## 2019-12-08 LAB — MRSA PCR SCREENING: MRSA by PCR: NEGATIVE

## 2019-12-08 LAB — MAGNESIUM: Magnesium: 2.4 mg/dL (ref 1.7–2.4)

## 2019-12-08 LAB — PHOSPHORUS: Phosphorus: 3.7 mg/dL (ref 2.5–4.6)

## 2019-12-08 MED ORDER — SODIUM CHLORIDE 0.9 % IV SOLN
2.0000 g | Freq: Three times a day (TID) | INTRAVENOUS | Status: DC
  Administered 2019-12-08 – 2019-12-10 (×6): 2 g via INTRAVENOUS
  Filled 2019-12-08 (×7): qty 2

## 2019-12-08 MED ORDER — ORAL CARE MOUTH RINSE
15.0000 mL | Freq: Two times a day (BID) | OROMUCOSAL | Status: DC
  Administered 2019-12-08: 15 mL via OROMUCOSAL

## 2019-12-08 MED ORDER — CHLORHEXIDINE GLUCONATE 0.12 % MT SOLN
15.0000 mL | Freq: Two times a day (BID) | OROMUCOSAL | Status: DC
  Administered 2019-12-08 – 2019-12-13 (×9): 15 mL via OROMUCOSAL
  Filled 2019-12-08 (×8): qty 15

## 2019-12-08 MED ORDER — CHLORHEXIDINE GLUCONATE CLOTH 2 % EX PADS
6.0000 | MEDICATED_PAD | Freq: Every day | CUTANEOUS | Status: DC
  Administered 2019-12-08 – 2019-12-29 (×21): 6 via TOPICAL

## 2019-12-08 MED ORDER — ORAL CARE MOUTH RINSE
15.0000 mL | Freq: Two times a day (BID) | OROMUCOSAL | Status: DC
  Administered 2019-12-09 – 2019-12-12 (×6): 15 mL via OROMUCOSAL

## 2019-12-08 NOTE — Progress Notes (Signed)
PROGRESS NOTE    Ronald Olsen  HFW:[id number] DOB: 1950-12-06 DOA: 11/26/2019 PCP: Pcp, No    Brief Narrative: 69 year old male with past medical history for hypertension, dyslipidemia who presented with dyspnea, malaise, fatigue and cough for about 10 days. Acute worsening of his symptoms over the last 72 hours prior to hospitalization. Multiple sick contacts at home, he has not been vaccinated. Due to severe symptoms he called EMS, his oxygen saturation was in the 70s and he was placed on a 10 L nonrebreather mask and transported to the hospital. On his initial physical examination he was afebrile, but tachycardic and tachypneic, requiring 6 L/min of supplemental oxygen per nasal cannula. His lungs had no wheezing but positive tachypnea, heart S1-S2, present, tachycardic, abdomen soft, no lower extremity edema. Chest radiograph with bilateral infiltrates at the lower lobes, peripherally, more left than right.  He has been placed on aggressive medical therapy with high dose systemic steroids, IV remdesivir and oral baricitinib. Patient with persistent high oxygen requirements despite maximum therapies.  10/1-10/3: Persistent high requirement of oxygen.  WBC count more than 35,000 with left shift.  Procalcitonin less than 0.1.  Repeat chest x-ray with persistent consolidation. Started on broad-spectrum antibiotics.  Episodes of hypotension. 10/3-10/4: Rapid response 10/4 morning, high oxygen requirement , moved to ICU for impending intubation. Case discussed with critical care.  Assessment & Plan:   Principal Problem:   Acute hypoxemic respiratory failure due to COVID-19 [facility]) Active Problems:   CAD (coronary artery disease)   Essential hypertension   Hyponatremia   Hyperlipidemia  Acute hypoxemic respiratory failure due to COVID-19 virus pneumonia: Continues to remain with significant symptoms and worsening hypoxemia today.  Patient has been on increasing oxygen  requirement for the last few days.  Overnight on 60 L heated high flow. chest physiotherapy, incentive spirometry, deep breathing exercises, sputum induction, mucolytic's and bronchodilators as much as he can do. Mobility as tolerated.  Difficult to tolerate mobility. Supplemental oxygen to keep saturations more than 82 %. Covid directed therapy with , steroids, remains on high-dose Solu-Medrol, currently on 40 mg twice a day. remdesivir, finished 5 days of therapy actemra, completed 14 days of therapy. Due to severity of symptoms, patient will need daily inflammatory markers, chest x-rays, liver function test to monitor and direct COVID-19 therapies. CTA 9/29 with extensive pneumonia, no PE. Trying intermittent Lasix.  Holding today with tachycardia and low blood pressure. Chest x-ray, 10/3 with worsening right lower lobe infiltrate. With persistent elevated WBC count and worsening hypoxemia with new right lower lobe infiltrate, started on broad-spectrum antibiotics with Rocephin, azithromycin and vancomycin on 10/3.  We will continue until clinical improvement.  Procalcitonin was normal.  COVID-19 Labs  Recent Labs    12/06/19 0543 12/07/19 0524  DDIMER 1.28* 1.39*  FERRITIN 1,299* 1,745*  CRP 10.0* 9.1*  9.5*    Lab Results  Component Value Date   SARSCOV2NAA POSITIVE (A) 11/07/2019   SpO2: 90 % O2 Flow Rate (L/min): 40 L/min (15L NRB) FiO2 (%): 100 %  Hypertension: Blood pressure stable today.  Was given 500 cc bolus fluid last night.  History of coronary artery disease: On dual antiplatelet therapy with aspirin and Plavix. Continue.  Echocardiogram today.  Acute kidney injury on chronic kidney disease stage II with hyponatremia and non-anion gap metabolic acidosis and hyperkalemia: Renal function stable.  Type 2 diabetes: On sliding scale insulin while on steroids.  DVT prophylaxis: Lovenox   Code Status: Full code Family Communication: Wife on the  phone call and  updated about patient's increasing oxygen requirement and transferred to ICU. Disposition Plan: Status is: Inpatient  Remains inpatient appropriate because:Inpatient level of care appropriate due to severity of illness   Dispo:  Patient From: Home  Planned Disposition: Home with Health Care Svc  Expected discharge date: Unknown .  medically stable for discharge: No          Consultants:   PCCM.  Procedures:   None  Antimicrobials:   Remdesivir, finished therapy  .  Rocephin 10/3--   Vancomycin 10/3 ---  Azithromycin 10/3 ---   Subjective: Patient seen and examined.  Has remained with increasing oxygen need overnight.  Early morning, rapid response was called, patient himself was feeling okay at complete rest.  He was requiring 60 L oxygen through heated high flow.  Afebrile overnight.  Remains tachycardic on monitor.  Objective: Vitals:   12/08/19 1100 12/08/19 1114 12/08/19 1157 12/08/19 1200  BP: 101/63  136/79 131/78  Pulse:   (!) 125 (!) 129  Resp: 20  (!) 24 (!) 24  Temp: 98 F (36.7 C)  98.7 F (37.1 C) 98.4 F (36.9 C)  TempSrc: Oral  Axillary Axillary  SpO2: (!) 86% (!) 88% (!) 88% 90%  Weight:    89.4 kg  Height:    5\' 11"  (1.803 m)    Intake/Output Summary (Last 24 hours) at 12/08/2019 1335 Last data filed at 12/08/2019 0547 Gross per 24 hour  Intake 1630 ml  Output 1750 ml  Net -120 ml   Filed Weights   12/03/2019 1418 12/08/19 1200  Weight: 99.8 kg 89.4 kg    Examination:  General exam: In moderate respiratory distress, sick looking, hypoxic and tachypneic on minimal mobility. Respiratory system: No added sounds.  No air entry at the bases. Cardiovascular system: No added sounds.  S1-S2 heard.  Tachycardic. Gastrointestinal system: Abdomen is nondistended, soft and nontender. No organomegaly or masses felt. Normal bowel sounds heard. Central nervous system: Alert and oriented. No focal neurological deficits. Extremities: Symmetric 5  x 5 power. Skin: No rashes, lesions or ulcers Psychiatry: Judgement and insight appear normal. Mood & affect anxious.    Data Reviewed: I have personally reviewed following labs and imaging studies  CBC: Recent Labs  Lab 12/03/19 1013 12/05/19 0335 12/06/19 0543 12/07/19 0524 12/08/19 0500  WBC 28.6* 39.1* 30.0* 35.6* 32.1*  NEUTROABS 23.9* 35.6* 27.1* 32.2* 29.2*  HGB 14.8 14.3 13.9 14.5 14.1  HCT 43.2 42.2 41.8 44.1 41.2  MCV 90.8 91.9 92.9 93.4 91.6  PLT 400 338 293 301 643   Basic Metabolic Panel: Recent Labs  Lab 12/04/19 0338 12/05/19 0335 12/06/19 0543 12/07/19 0524 12/08/19 0500  NA 131* 130* 132* 134* 131*  K 5.0 5.1 5.3* 5.0 5.0  CL 98 98 98 96* 97*  CO2 23 21* 25 26 25   GLUCOSE 157* 143* 145* 126* 113*  BUN 48* 45* 42* 57* 53*  CREATININE 1.19 1.04 0.98 1.06 1.10  CALCIUM 8.8* 8.7* 8.9 9.2 8.7*  MG  --   --   --   --  2.4  PHOS  --   --   --   --  3.7   GFR: Estimated Creatinine Clearance: 69.4 mL/min (by C-G formula based on SCr of 1.1 mg/dL). Liver Function Tests: Recent Labs  Lab 12/04/19 0338 12/05/19 0335 12/06/19 0543 12/07/19 0524 12/08/19 0500  AST 25 33 26 32 37  ALT 41 54* 62* 80* 82*  ALKPHOS 68 63 66 70  72  BILITOT 0.9 0.9 1.0 0.8 0.9  PROT 6.6 6.3* 6.5 6.8 6.4*  ALBUMIN 3.0* 2.8* 2.8* 2.8* 2.4*   No results for input(s): LIPASE, AMYLASE in the last 168 hours. No results for input(s): AMMONIA in the last 168 hours. Coagulation Profile: No results for input(s): INR, PROTIME in the last 168 hours. Cardiac Enzymes: No results for input(s): CKTOTAL, CKMB, CKMBINDEX, TROPONINI in the last 168 hours. BNP (last 3 results) No results for input(s): PROBNP in the last 8760 hours. HbA1C: No results for input(s): HGBA1C in the last 72 hours. CBG: Recent Labs  Lab 12/04/19 1143 12/04/19 1651 12/07/19 1955 12/08/19 0818 12/08/19 1213  GLUCAP 185* 178* 174* 133* 116*   Lipid Profile: No results for input(s): CHOL, HDL, LDLCALC,  TRIG, CHOLHDL, LDLDIRECT in the last 72 hours. Thyroid Function Tests: No results for input(s): TSH, T4TOTAL, FREET4, T3FREE, THYROIDAB in the last 72 hours. Anemia Panel: Recent Labs    12/06/19 0543 12/07/19 0524  FERRITIN 1,299* 1,745*   Sepsis Labs: Recent Labs  Lab 12/05/19 0335 12/06/19 0543  PROCALCITON <0.10 <0.10    Recent Results (from the past 240 hour(s))  MRSA PCR Screening     Status: None   Collection Time: 12/07/19  7:35 AM   Specimen: Nasal Mucosa; Nasopharyngeal  Result Value Ref Range Status   MRSA by PCR NEGATIVE NEGATIVE Final    Comment:        The GeneXpert MRSA Assay (FDA approved for NASAL specimens only), is one component of a comprehensive MRSA colonization surveillance program. It is not intended to diagnose MRSA infection nor to guide or monitor treatment for MRSA infections. Performed at [facility], [place] [address] [place], [place] [address]   MRSA PCR Screening     Status: None   Collection Time: 12/08/19 12:11 PM   Specimen: Nasal Mucosa; Nasopharyngeal  Result Value Ref Range Status   MRSA by PCR NEGATIVE NEGATIVE Final    Comment:        The GeneXpert MRSA Assay (FDA approved for NASAL specimens only), is one component of a comprehensive MRSA colonization surveillance program. It is not intended to diagnose MRSA infection nor to guide or monitor treatment for MRSA infections. Performed at [facility], [place] [address], [place], [place] [address]          Radiology Studies: DG CHEST PORT 1 VIEW  Result Date: 12/07/2019 CLINICAL DATA:  Increase in work of breathing. Pt is positive for Covid-19. H/o HTN. Former smoker. EXAM: PORTABLE CHEST 1 VIEW COMPARISON:  Chest radiograph 12/06/2019 FINDINGS: Stable cardiomediastinal contours. Low lung volumes. Diffuse infiltrates in the right lung, increased peripherally. Persistent infiltrates in the lower left lung. No pneumothorax or significant  pleural effusion. No acute finding in the visualized skeleton. IMPRESSION: Increasing infiltrates in the peripheral right lung. Electronically Signed   By: Audie Pinto M.D.   On: 12/07/2019 17:47        Scheduled Meds: . alfuzosin  10 mg Oral Q breakfast  . aspirin EC  81 mg Oral Daily  . atorvastatin  40 mg Oral Daily  . azithromycin  500 mg Oral Daily  . chlorhexidine  15 mL Mouth Rinse BID  . Chlorhexidine Gluconate Cloth  6 each Topical Daily  . chlorpheniramine-HYDROcodone  5 mL Oral Q12H  . clopidogrel  75 mg Oral Daily  . cyclobenzaprine  10 mg Oral BID  . enoxaparin (LOVENOX) injection  40 mg Subcutaneous Q24H  . feeding supplement (  ENSURE ENLIVE)  237 mL Oral BID BM  . finasteride  5 mg Oral Daily  . insulin aspart  0-5 Units Subcutaneous QHS  . insulin aspart  0-9 Units Subcutaneous TID WC  . Ipratropium-Albuterol  1 puff Inhalation QID  . mouth rinse  15 mL Mouth Rinse q12n4p  . mouth rinse  15 mL Mouth Rinse q12n4p  . methylPREDNISolone (SOLU-MEDROL) injection  40 mg Intravenous Q12H  . pantoprazole  40 mg Oral Daily  . sodium chloride flush  3 mL Intravenous Q12H   Continuous Infusions: . ceFEPime (MAXIPIME) IV       LOS: 14 days    Time spent: 45 minutes    Barb Merino, MD [facility] Pager [phone]

## 2019-12-08 NOTE — Progress Notes (Signed)
PT Cancellation Note  Patient Details Name: Ronald Olsen MRN: [id number] DOB: 06-Nov-1950   Cancelled Treatment:    Reason Eval/Treat Not Completed: Medical issues which prohibited therapy. Elevated HR (128 at rest), decr BP 97/57.   [facility] 12/08/2019, 10:25 AM

## 2019-12-08 NOTE — Progress Notes (Signed)
Pharmacy Antibiotic Note  Ronald Olsen is a 69 y.o. male admitted on 11/19/2019 with Covid pneumonia.  Pharmacy has been consulted for cefepime dosing for PNA.  12/08/2019  ABX D#2, Vanc/CTX/azith to transition to Cefepime/azith WBC 32.1 (on solumedrol 40q12) SCr 1.1 Tm 98.7 10/3 CRP 9.5 & 9.1 10/2 PCT neg Completed 14 days barictinib for Covid on 10/3  Plan: Cefepime 2 gm IV q8h Azithromycin 500 po q24 day #2/5 per MD F/u renal fxn, WBC, temp. Culture data   Height: 5\' 11"  (180.3 cm) Weight: 89.4 kg (197 lb 1.5 oz) IBW/kg (Calculated) : 75.3  Temp (24hrs), Avg:98.2 F (36.8 C), Min:97.5 F (36.4 C), Max:98.7 F (37.1 C)  Recent Labs  Lab 12/03/19 0335 12/03/19 1013 12/04/19 0338 12/05/19 0335 12/06/19 0543 12/07/19 0524 12/08/19 0500  WBC  --  28.6*  --  39.1* 30.0* 35.6* 32.1*  CREATININE   < >  --  1.19 1.04 0.98 1.06 1.10   < > = values in this interval not displayed.    Estimated Creatinine Clearance: 69.4 mL/min (by C-G formula based on SCr of 1.1 mg/dL).    Allergies  Allergen Reactions  . Amoxicillin Other (See Comments)    "Cloudy headed" with long doses    Antimicrobials this admission:  9/20 Remdesivir  > 9/24 9/20 Baricitinib >10/3 10/3 vanc>>10/4 10/4 cefepime>> 10/3 CTX> 10/4 10/3 azith>> (10/7) Dose adjustments this admission:   Microbiology results:  9/20 BCx x2: NGF 9/20 COVID+ 10/3 MRSA PCR neg 10/4 MRSA PCR neg 10/3 sputum ordered, not yet collected  Thank you for allowing pharmacy to be a part of this patient's care.  Eudelia Bunch, Pharm.D 12/08/2019 1:45 PM

## 2019-12-08 NOTE — Progress Notes (Signed)
   12/08/19 0955  Assess: MEWS Score  Temp 98.1 F (36.7 C)  BP (!) 97/57  Pulse Rate (!) 128  Resp (!) 21  Level of Consciousness Alert  SpO2 90 %  O2 Device HFNC;Non-rebreather Mask  Assess: MEWS Score  MEWS Temp 0  MEWS Systolic 1  MEWS Pulse 2  MEWS RR 1  MEWS LOC 0  MEWS Score 4  MEWS Score Color Red  Treat  Pain Scale 0-10  Pain Score 0  Take Vital Signs  Increase Vital Sign Frequency  Red: Q 1hr X 4 then Q 4hr X 4, if remains red, continue Q 4hrs  Escalate  MEWS: Escalate Red: discuss with charge nurse/RN and provider, consider discussing with RRT  Notify: Charge Nurse/RN  Name of Charge Nurse/RN Notified Hinton Dyer  Date Charge Nurse/RN Notified 12/08/19  Time Charge Nurse/RN Notified 1000  Notify: Provider  Provider Name/Title Provider  Date Provider Notified 12/08/19  Time Provider Notified [phone]  Notification Type Face-to-face  Notification Reason Change in status  Response See new orders  Date of Provider Response 12/08/19  Time of Provider Response 0950  Notify: Rapid Response  Date Rapid Response Notified 12/08/19  Time Rapid Response Notified 7903  Document  Patient Outcome Transferred/level of care increased;Stabilized after interventions  Progress note created (see row info) Yes

## 2019-12-08 NOTE — Progress Notes (Signed)
Pt transferred to ICU 1225 at this time.

## 2019-12-08 NOTE — Consult Note (Addendum)
NAME:  Ronald Olsen, MRN:  [id number], DOB:  1950/09/16, LOS: 70 ADMISSION DATE:  11/08/2019, CONSULTATION DATE:  10/4 REFERRING MD:  Doreatha Martin, CHIEF COMPLAINT:  Respiratory failure in setting of COVID-19   Brief History   69 year old male patient admitted on 9/20 with Covid pneumonia. Treated with supplemental oxygen, systemic steroids, remdesivir and baricitinib. In spite of appropriate interventions transferred to the intensive care on 10/4 with progressive respiratory failure  History of present illness   69 year old male patient with history as mentioned below presented to the emergency room on 9/20 with chief complaint of fatigue, cough, and worsening dyspnea over approximately 10-day course with acute worsening of symptoms 3 days prior to presentation.  He actually called EMS due to the degree of his symptom burden, on EMS arrival pulse oximetry noted to be 70% requiring up to 10 L via nonrebreather mask to safely transfer the patient to the hospital. On arrival to the emergency he was hypoxic. CXR w/ bilateral infiltrates. + COVID-19, CRP 6.3, ddimer 1.67, PCT <0.10, BNP was neg.  He was started on high dose steroids, Remdesivirm and baricitinib.    Past Medical History  Hypertension, dyslipidemia. Coronary artery disease,  Significant Hospital Events   9/20:  CXR w/ bilateral infiltrates. + COVID-19, CRP 6.3, ddimer 1.67, PCT <0.10, BNP was neg.  He was started on high dose steroids, Remdesivirm and baricitinib.  9/21: 15 liters high flow.  9/22 CRP down 3.4 but still on high FIO2 9/25 completed day 5 of Remdesivir. Still on high dose steroids and Baricitinib  9/26 increased oxygen demand. Placed on NRB and high flow.  9/28 rapid desaturation w/ any movement  9/29 CT angio: negative for PE  9/30: no better  10/4 worsening rapid response called. PCCM asked to see w/ tranfers to ICU  Consults:  Pulmonary consulted 10/4 Procedures:    Significant Diagnostic Tests:  CT  chest 9/29 showed multifocal groundglass attenuations bilaterally through all lobes  Micro Data:  9/29:SARS/coronavirus 2: Positive. 9/20 blood culture x2: Negative.  Antimicrobials:  Remdesivir 9/20 through 9/25 vanc 10/3>> Ctx 10/3>>10/4 Cefepime 10/4>> azith 10/3>> Interim history/subjective:  Feels comfortable currently  Objective   Blood pressure 101/63, pulse (Abnormal) 128, temperature 98 F (36.7 C), temperature source Oral, resp. rate 20, height 5\' 11"  (1.803 m), weight 99.8 kg, SpO2 (Abnormal) 88 %.    FiO2 (%):  [100 %] 100 %   Intake/Output Summary (Last 24 hours) at 12/08/2019 1136 Last data filed at 12/08/2019 0547 Gross per 24 hour  Intake 1630 ml  Output 1750 ml  Net -120 ml   Filed Weights   12/04/2019 1418  Weight: 99.8 kg    Examination: General: This is a 69 year old white male he appears to be acutely ill, he is not in current distress however takes very little to have marked work of breathing HENT: Normocephalic atraumatic mucous membranes are moist Lungs: Diminished throughout, tachypneic with respiratory rate in the 30s currently no nasal flare, does have some mild accessory use otherwise Cardiovascular: Tachycardic rhythm no murmur rub or gallop Abdomen: Soft not tender Extremities: Warm dry brisk capillary refill Neuro: Awake and oriented GU: [place] [facility] Problem list     Assessment & Plan:  Acute hypoxic respiratory failure in the setting of Covid pneumonia and ARDS -He has completed both baricitinib and remdesivir -Portable chest x-ray personally reviewed demonstrates diffuse pulmonary infiltrates which have been persistent in nature, right greater than left and review of film on 10/3 -Little else  to do at this point other than supportive measures, have discussed this with the patient he understands, he would want intubation, and full supportive care however he is agreeable to DO NOT RESUSCITATE should we embark on those  measures Plan We will try to avoid intubation Continue supplemental oxygen excepting saturation greater than 85%, we can try to cycle on and off BiPAP however this typically does not work Repeat procalcitonin today BAL if intubated Continue current prednisone at 40 mg twice a day, clearly this has not helped much  Daily assessment for diuretics Change ceftriaxone to cefepime, continue vancomycin Echocardiogram  Sinus tachycardia Plan Supportive care Telemetry monitoring  Leukocytosis, suspect steroid-induced Plan Continue to monitor  Mild hyperglycemia. Suspect steroid-induced Plan Sliding scale insulin for glucose goal 140-180    Best practice:  Diet: Diet as tolerated Pain/Anxiety/Delirium protocol (if indicated): Not indicated at this point VAP protocol (if indicated): Not indicated at this point DVT prophylaxis: Enoxaparin GI prophylaxis: None Glucose control sliding scale insulin Mobility: Bedrest Code Status: Full code, however if intubated would likely become DNR should he suffer a cardiac arrest Family Communication: Pending Disposition:  Agree with transfer to intensive care he is extremely high risk for intubation Labs   CBC: Recent Labs  Lab 12/03/19 1013 12/05/19 0335 12/06/19 0543 12/07/19 0524 12/08/19 0500  WBC 28.6* 39.1* 30.0* 35.6* 32.1*  NEUTROABS 23.9* 35.6* 27.1* 32.2* 29.2*  HGB 14.8 14.3 13.9 14.5 14.1  HCT 43.2 42.2 41.8 44.1 41.2  MCV 90.8 91.9 92.9 93.4 91.6  PLT 400 338 293 301 893    Basic Metabolic Panel: Recent Labs  Lab 12/04/19 0338 12/05/19 0335 12/06/19 0543 12/07/19 0524 12/08/19 0500  NA 131* 130* 132* 134* 131*  K 5.0 5.1 5.3* 5.0 5.0  CL 98 98 98 96* 97*  CO2 23 21* 25 26 25   GLUCOSE 157* 143* 145* 126* 113*  BUN 48* 45* 42* 57* 53*  CREATININE 1.19 1.04 0.98 1.06 1.10  CALCIUM 8.8* 8.7* 8.9 9.2 8.7*  MG  --   --   --   --  2.4  PHOS  --   --   --   --  3.7   GFR: Estimated Creatinine Clearance: 78.4  mL/min (by C-G formula based on SCr of 1.1 mg/dL). Recent Labs  Lab 12/05/19 0335 12/06/19 0543 12/07/19 0524 12/08/19 0500  PROCALCITON <0.10 <0.10  --   --   WBC 39.1* 30.0* 35.6* 32.1*    Liver Function Tests: Recent Labs  Lab 12/04/19 0338 12/05/19 0335 12/06/19 0543 12/07/19 0524 12/08/19 0500  AST 25 33 26 32 37  ALT 41 54* 62* 80* 82*  ALKPHOS 68 63 66 70 72  BILITOT 0.9 0.9 1.0 0.8 0.9  PROT 6.6 6.3* 6.5 6.8 6.4*  ALBUMIN 3.0* 2.8* 2.8* 2.8* 2.4*   No results for input(s): LIPASE, AMYLASE in the last 168 hours. No results for input(s): AMMONIA in the last 168 hours.  ABG No results found for: PHART, PCO2ART, PO2ART, HCO3, TCO2, ACIDBASEDEF, O2SAT   Coagulation Profile: No results for input(s): INR, PROTIME in the last 168 hours.  Cardiac Enzymes: No results for input(s): CKTOTAL, CKMB, CKMBINDEX, TROPONINI in the last 168 hours.  HbA1C: Hgb A1c MFr Bld  Date/Time Value Ref Range Status  11/25/2019 05:59 AM 5.9 (H) 4.8 - 5.6 % Final    Comment:    (NOTE) Pre diabetes:          5.7%-6.4%  Diabetes:              >  6.4%  Glycemic control for   <7.0% adults with diabetes     CBG: Recent Labs  Lab 12/04/19 0753 12/04/19 1143 12/04/19 1651 12/07/19 1955 12/08/19 0818  GLUCAP 152* 185* 178* 174* 133*    Review of Systems:   Not able due to work of breathing   Past Medical History  He,  has a past medical history of Elevated PSA, Family history of prostate problems, Frequent headaches, GERD (gastroesophageal reflux disease), Hyperlipidemia, Hypertension, and Migraines.   Surgical History    Past Surgical History:  Procedure Laterality Date  . APPENDECTOMY    . CHOLECYSTECTOMY    . HERNIA REPAIR    . VASECTOMY       Social History   reports that he has quit smoking. He has quit using smokeless tobacco. He reports current alcohol use. He reports that he does not use drugs.   Family History   His family history includes Cancer in his  father; Diabetes in his father and paternal grandmother; Early death in his maternal grandfather and paternal grandfather; Hearing loss in his brother; Heart attack in his father; Heart disease in his brother, father, and paternal grandfather; Hyperlipidemia in his brother and father; Hypertension in his brother, father, and mother.   Allergies Allergies  Allergen Reactions  . Amoxicillin Other (See Comments)    "Cloudy headed" with long doses     Home Medications  Prior to Admission medications   Medication Sig Start Date End Date Taking? Authorizing Provider  alfuzosin (UROXATRAL) 10 MG 24 hr tablet Take 10 mg by mouth daily with breakfast.   Yes [provider]  aspirin EC 81 MG tablet Take 81 mg by mouth daily.   Yes [provider]  atorvastatin (LIPITOR) 40 MG tablet Take 40 mg by mouth daily. 04/28/19  Yes [provider]  clopidogrel (PLAVIX) 75 MG tablet Take 75 mg by mouth daily. 04/28/19  Yes [provider]  cyclobenzaprine (FLEXERIL) 10 MG tablet Take 10 mg by mouth 3 (three) times daily. 03/20/19  Yes [provider]  finasteride (PROSCAR) 5 MG tablet Take 5 mg by mouth daily. 05/05/19  Yes [provider]  lisinopril (ZESTRIL) 20 MG tablet Take 20 mg by mouth 2 (two) times daily. 06/01/19  Yes [provider]  pantoprazole (PROTONIX) 40 MG tablet TAKE 1 TABLET BY MOUTH ONCE DAILY Patient taking differently: Take 40 mg by mouth daily.  08/26/18  Yes Billie Ruddy, MD  sulfamethoxazole-trimethoprim (BACTRIM DS) 800-160 MG tablet Take 1 tablet by mouth 2 (two) times daily. Started 9.14.21 x10ds 11/18/19  Yes [provider]  nitroGLYCERIN (NITROSTAT) 0.4 MG SL tablet Place 0.4 mg under the tongue every 5 (five) minutes as needed for chest pain.  12/10/18   [provider]     Critical care time: 34 min    Erick Colace ACNP-BC [place] Pager # [phone] OR # [phone] if no  answer

## 2019-12-08 NOTE — Plan of Care (Signed)
  Problem: Education: Goal: Knowledge of risk factors and measures for prevention of condition will improve Outcome: Progressing   Problem: Respiratory: Goal: Will maintain a patent airway Outcome: Progressing Goal: Complications related to the disease process, condition or treatment will be avoided or minimized Outcome: Progressing   Problem: Education: Goal: Knowledge of General Education information will improve Description: Including pain rating scale, medication(s)/side effects and non-pharmacologic comfort measures Outcome: Progressing   Problem: Health Behavior/Discharge Planning: Goal: Ability to manage health-related needs will improve Outcome: Progressing   Problem: Clinical Measurements: Goal: Ability to maintain clinical measurements within normal limits will improve Outcome: Progressing Goal: Will remain free from infection Outcome: Progressing Goal: Diagnostic test results will improve Outcome: Progressing Goal: Respiratory complications will improve Outcome: Progressing   Problem: Activity: Goal: Risk for activity intolerance will decrease Outcome: Progressing   Problem: Coping: Goal: Level of anxiety will decrease Outcome: Progressing   Problem: Elimination: Goal: Will not experience complications related to bowel motility Outcome: Progressing   Problem: Pain Managment: Goal: General experience of comfort will improve Outcome: Progressing   Problem: Safety: Goal: Ability to remain free from injury will improve Outcome: Progressing   Problem: Skin Integrity: Goal: Risk for impaired skin integrity will decrease Outcome: Progressing

## 2019-12-08 NOTE — Progress Notes (Signed)
Report received by Lynita Lombard for ICU room 1225

## 2019-12-08 NOTE — Significant Event (Signed)
Rapid Response Event Note   Reason for Call :  Called to bedside due to patient increased work of breathing and increased oxygen needs  Initial Focused Assessment:  Neuro: Alert and oriented x4 Resp: lung sounds clear but diminished. Tachypneaic and requiring Non-breather and heated HFNC at 60L/100%. O2 saturations between 83-90% Cardiac: S1 and S2 heard. Pt sinus tach in the 120s.   Interventions:  Dr. Sloan Leiter at bedside. Discussed O2 goals to be about 82%  Plan of Care:  Pt plan to transfer to ICU/SD   Event Summary:   MD Notified: Dr. Sloan Leiter Call Time: [place] [id number] End Time: 2904  Jerene Pitch, RN, BSN

## 2019-12-08 NOTE — Plan of Care (Signed)
Pt MEWS score is currently red and waiting to be transferred to ICU bed 1225.  Problem: Education: Goal: Knowledge of risk factors and measures for prevention of condition will improve Outcome: Progressing   Problem: Respiratory: Goal: Will maintain a patent airway Outcome: Progressing Goal: Complications related to the disease process, condition or treatment will be avoided or minimized Outcome: Progressing   Problem: Education: Goal: Knowledge of General Education information will improve Description: Including pain rating scale, medication(s)/side effects and non-pharmacologic comfort measures Outcome: Progressing   Problem: Health Behavior/Discharge Planning: Goal: Ability to manage health-related needs will improve Outcome: Progressing   Problem: Clinical Measurements: Goal: Ability to maintain clinical measurements within normal limits will improve Outcome: Progressing Goal: Will remain free from infection Outcome: Progressing Goal: Diagnostic test results will improve Outcome: Progressing Goal: Respiratory complications will improve Outcome: Progressing   Problem: Activity: Goal: Risk for activity intolerance will decrease Outcome: Progressing   Problem: Coping: Goal: Level of anxiety will decrease Outcome: Progressing   Problem: Elimination: Goal: Will not experience complications related to bowel motility Outcome: Progressing   Problem: Pain Managment: Goal: General experience of comfort will improve Outcome: Progressing   Problem: Safety: Goal: Ability to remain free from injury will improve Outcome: Progressing   Problem: Skin Integrity: Goal: Risk for impaired skin integrity will decrease Outcome: Progressing

## 2019-12-09 ENCOUNTER — Inpatient Hospital Stay (HOSPITAL_COMMUNITY): Payer: Medicare Other | Admitting: Certified Registered Nurse Anesthetist

## 2019-12-09 ENCOUNTER — Inpatient Hospital Stay (HOSPITAL_COMMUNITY): Payer: Medicare Other

## 2019-12-09 ENCOUNTER — Encounter (HOSPITAL_COMMUNITY): Payer: Medicare Other

## 2019-12-09 ENCOUNTER — Inpatient Hospital Stay: Payer: Self-pay

## 2019-12-09 DIAGNOSIS — U071 COVID-19: Secondary | ICD-10-CM | POA: Diagnosis not present

## 2019-12-09 DIAGNOSIS — J9601 Acute respiratory failure with hypoxia: Secondary | ICD-10-CM

## 2019-12-09 DIAGNOSIS — J80 Acute respiratory distress syndrome: Secondary | ICD-10-CM | POA: Diagnosis not present

## 2019-12-09 DIAGNOSIS — I361 Nonrheumatic tricuspid (valve) insufficiency: Secondary | ICD-10-CM

## 2019-12-09 LAB — COMPREHENSIVE METABOLIC PANEL
ALT: 80 U/L — ABNORMAL HIGH (ref 0–44)
AST: 38 U/L (ref 15–41)
Albumin: 2.5 g/dL — ABNORMAL LOW (ref 3.5–5.0)
Alkaline Phosphatase: 80 U/L (ref 38–126)
Anion gap: 12 (ref 5–15)
BUN: 49 mg/dL — ABNORMAL HIGH (ref 8–23)
CO2: 23 mmol/L (ref 22–32)
Calcium: 8.6 mg/dL — ABNORMAL LOW (ref 8.9–10.3)
Chloride: 100 mmol/L (ref 98–111)
Creatinine, Ser: 1.07 mg/dL (ref 0.61–1.24)
GFR calc Af Amer: >60 mL/min (ref >60)
GFR calc non Af Amer: >60 mL/min (ref >60)
Glucose, Bld: 140 mg/dL — ABNORMAL HIGH (ref 70–99)
Potassium: 5.4 mmol/L — ABNORMAL HIGH (ref 3.5–5.1)
Sodium: 135 mmol/L (ref 135–145)
Total Bilirubin: 1 mg/dL (ref 0.3–1.2)
Total Protein: 6.7 g/dL (ref 6.5–8.1)

## 2019-12-09 LAB — CBC WITH DIFFERENTIAL/PLATELET
Abs Immature Granulocytes: 0.42 10*3/uL — ABNORMAL HIGH (ref 0.00–0.07)
Basophils Absolute: 0.1 10*3/uL (ref 0.0–0.1)
Basophils Relative: 0 %
Eosinophils Absolute: 0 10*3/uL (ref 0.0–0.5)
Eosinophils Relative: 0 %
HCT: 42 % (ref 39.0–52.0)
Hemoglobin: 13.9 g/dL (ref 13.0–17.0)
Immature Granulocytes: 1 %
Lymphocytes Relative: 1 %
Lymphs Abs: 0.3 10*3/uL — ABNORMAL LOW (ref 0.7–4.0)
MCH: 31 pg (ref 26.0–34.0)
MCHC: 33.1 g/dL (ref 30.0–36.0)
MCV: 93.5 fL (ref 80.0–100.0)
Monocytes Absolute: 0.8 10*3/uL (ref 0.1–1.0)
Monocytes Relative: 3 %
Neutro Abs: 27.7 10*3/uL — ABNORMAL HIGH (ref 1.7–7.7)
Neutrophils Relative %: 95 %
Platelets: 226 10*3/uL (ref 150–400)
RBC: 4.49 MIL/uL (ref 4.22–5.81)
RDW: 12.8 % (ref 11.5–15.5)
WBC: 29.3 10*3/uL — ABNORMAL HIGH (ref 4.0–10.5)
nRBC: 0 % (ref 0.0–0.2)

## 2019-12-09 LAB — ECHOCARDIOGRAM COMPLETE
Area-P 1/2: 3.27 cm2
Height: 71 in
S' Lateral: 1.8 cm
Weight: 3104.08 oz

## 2019-12-09 LAB — BLOOD GAS, ARTERIAL
Acid-base deficit: 5.2 mmol/L — ABNORMAL HIGH (ref 0.0–2.0)
Acid-base deficit: 6.7 mmol/L — ABNORMAL HIGH (ref 0.0–2.0)
Bicarbonate: 25.3 mmol/L (ref 20.0–28.0)
Bicarbonate: 23 mmol/L (ref 20.0–28.0)
FIO2: 100
O2 Saturation: 94.2 %
O2 Saturation: 93.6 %
Patient temperature: 98.6
Patient temperature: 98.6
pCO2 arterial: 74.9 mmHg (ref 32.0–48.0)
pCO2 arterial: 68.5 mmHg (ref 32.0–48.0)
pH, Arterial: 7.155 — CL (ref 7.350–7.450)
pH, Arterial: 7.153 — CL (ref 7.350–7.450)
pO2, Arterial: 99.7 mmHg (ref 83.0–108.0)
pO2, Arterial: 90.8 mmHg (ref 83.0–108.0)

## 2019-12-09 LAB — GLUCOSE, CAPILLARY
Glucose-Capillary: 112 mg/dL — ABNORMAL HIGH (ref 70–99)
Glucose-Capillary: 124 mg/dL — ABNORMAL HIGH (ref 70–99)
Glucose-Capillary: 138 mg/dL — ABNORMAL HIGH (ref 70–99)
Glucose-Capillary: 205 mg/dL — ABNORMAL HIGH (ref 70–99)
Glucose-Capillary: 239 mg/dL — ABNORMAL HIGH (ref 70–99)

## 2019-12-09 LAB — HEMOGLOBIN AND HEMATOCRIT, BLOOD
HCT: 42.3 % (ref 39.0–52.0)
Hemoglobin: 13.2 g/dL (ref 13.0–17.0)

## 2019-12-09 LAB — D-DIMER, QUANTITATIVE: D-Dimer, Quant: 4.86 ug/mL-FEU — ABNORMAL HIGH (ref 0.00–0.50)

## 2019-12-09 MED ORDER — FENTANYL BOLUS VIA INFUSION
25.0000 ug | INTRAVENOUS | Status: DC | PRN
  Administered 2019-12-10 – 2019-12-29 (×66): 25 ug via INTRAVENOUS
  Filled 2019-12-09: qty 25

## 2019-12-09 MED ORDER — FREE WATER
200.0000 mL | Freq: Four times a day (QID) | Status: DC
  Administered 2019-12-09 – 2019-12-13 (×11): 200 mL

## 2019-12-09 MED ORDER — ASPIRIN 81 MG PO CHEW
81.0000 mg | CHEWABLE_TABLET | Freq: Every day | ORAL | Status: DC
  Filled 2019-12-09: qty 1

## 2019-12-09 MED ORDER — SODIUM CHLORIDE 0.9 % IV BOLUS
1000.0000 mL | Freq: Once | INTRAVENOUS | Status: AC
  Administered 2019-12-09: 1000 mL via INTRAVENOUS

## 2019-12-09 MED ORDER — SUCCINYLCHOLINE CHLORIDE 20 MG/ML IJ SOLN
INTRAMUSCULAR | Status: DC | PRN
  Administered 2019-12-09: 100 mg via INTRAVENOUS

## 2019-12-09 MED ORDER — PHENYLEPHRINE 40 MCG/ML (10ML) SYRINGE FOR IV PUSH (FOR BLOOD PRESSURE SUPPORT)
PREFILLED_SYRINGE | INTRAVENOUS | Status: AC
  Filled 2019-12-09: qty 10

## 2019-12-09 MED ORDER — DOCUSATE SODIUM 50 MG/5ML PO LIQD: 100.0000 mg | Freq: Two times a day (BID) | ORAL | Status: DC

## 2019-12-09 MED ORDER — POLYETHYLENE GLYCOL 3350 17 G PO PACK
17.0000 g | PACK | Freq: Every day | ORAL | Status: DC
  Filled 2019-12-09: qty 1

## 2019-12-09 MED ORDER — CLOPIDOGREL BISULFATE 75 MG PO TABS
75.0000 mg | ORAL_TABLET | Freq: Every day | ORAL | Status: DC
  Filled 2019-12-09: qty 1

## 2019-12-09 MED ORDER — ETOMIDATE 2 MG/ML IV SOLN
INTRAVENOUS | Status: AC
  Administered 2019-12-09: 40 mg
  Filled 2019-12-09: qty 20

## 2019-12-09 MED ORDER — VECURONIUM BROMIDE 10 MG IV SOLR
INTRAVENOUS | Status: AC
  Administered 2019-12-09: 10 mg
  Filled 2019-12-09: qty 10

## 2019-12-09 MED ORDER — PHENYLEPHRINE 40 MCG/ML (10ML) SYRINGE FOR IV PUSH (FOR BLOOD PRESSURE SUPPORT)
PREFILLED_SYRINGE | INTRAVENOUS | Status: AC
  Administered 2019-12-09: 400 ug
  Filled 2019-12-09: qty 10

## 2019-12-09 MED ORDER — SODIUM CHLORIDE 0.9% FLUSH
10.0000 mL | Freq: Two times a day (BID) | INTRAVENOUS | Status: DC
  Administered 2019-12-10 – 2019-12-18 (×18): 10 mL
  Administered 2019-12-19: 20 mL
  Administered 2019-12-19 – 2019-12-29 (×17): 10 mL

## 2019-12-09 MED ORDER — ENOXAPARIN SODIUM 100 MG/ML ~~LOC~~ SOLN: 1.0000 mg/kg | SUBCUTANEOUS | Status: DC

## 2019-12-09 MED ORDER — ROCURONIUM BROMIDE 10 MG/ML (PF) SYRINGE
PREFILLED_SYRINGE | INTRAVENOUS | Status: AC
  Administered 2019-12-09: 100 mg
  Filled 2019-12-09: qty 10

## 2019-12-09 MED ORDER — SODIUM BICARBONATE 8.4 % IV SOLN
100.0000 meq | Freq: Once | INTRAVENOUS | Status: AC
  Administered 2019-12-09: 100 meq via INTRAVENOUS
  Filled 2019-12-09: qty 50

## 2019-12-09 MED ORDER — ARTIFICIAL TEARS OPHTHALMIC OINT
1.0000 "application " | TOPICAL_OINTMENT | Freq: Three times a day (TID) | OPHTHALMIC | Status: DC
  Administered 2019-12-10 – 2019-12-15 (×13): 1 via OPHTHALMIC
  Filled 2019-12-09: qty 3.5

## 2019-12-09 MED ORDER — PROSOURCE PLUS PO LIQD
30.0000 mL | Freq: Two times a day (BID) | ORAL | Status: DC
  Filled 2019-12-09: qty 30

## 2019-12-09 MED ORDER — STERILE WATER FOR INJECTION IV SOLN
INTRAVENOUS | Status: DC
  Filled 2019-12-09: qty 150

## 2019-12-09 MED ORDER — FENTANYL 2500MCG IN NS 250ML (10MCG/ML) PREMIX INFUSION
0.0000 ug/h | INTRAVENOUS | Status: DC
  Administered 2019-12-09: 25 ug/h via INTRAVENOUS
  Administered 2019-12-10: 200 ug/h via INTRAVENOUS
  Administered 2019-12-10: 150 ug/h via INTRAVENOUS
  Administered 2019-12-11 – 2019-12-13 (×6): 200 ug/h via INTRAVENOUS
  Administered 2019-12-14: 175 ug/h via INTRAVENOUS
  Administered 2019-12-15: 300 ug/h via INTRAVENOUS
  Administered 2019-12-15: 325 ug/h via INTRAVENOUS
  Administered 2019-12-15: 175 ug/h via INTRAVENOUS
  Administered 2019-12-16: 300 ug/h via INTRAVENOUS
  Administered 2019-12-16: 250 ug/h via INTRAVENOUS
  Administered 2019-12-17: 325 ug/h via INTRAVENOUS
  Administered 2019-12-17: 275 ug/h via INTRAVENOUS
  Administered 2019-12-17: 325 ug/h via INTRAVENOUS
  Administered 2019-12-18: 275 ug/h via INTRAVENOUS
  Administered 2019-12-18 – 2019-12-19 (×2): 325 ug/h via INTRAVENOUS
  Administered 2019-12-19 (×2): 300 ug/h via INTRAVENOUS
  Administered 2019-12-20 – 2019-12-21 (×4): 350 ug/h via INTRAVENOUS
  Administered 2019-12-21: 400 ug/h via INTRAVENOUS
  Administered 2019-12-21: 350 ug/h via INTRAVENOUS
  Administered 2019-12-22 – 2019-12-24 (×8): 400 ug/h via INTRAVENOUS
  Administered 2019-12-24: 375 ug/h via INTRAVENOUS
  Administered 2019-12-24: 400 ug/h via INTRAVENOUS
  Administered 2019-12-24: 375 ug/h via INTRAVENOUS
  Administered 2019-12-25 – 2019-12-30 (×21): 400 ug/h via INTRAVENOUS
  Filled 2019-12-09 (×64): qty 250

## 2019-12-09 MED ORDER — PROPOFOL 1000 MG/100ML IV EMUL
0.0000 ug/kg/min | INTRAVENOUS | Status: DC
  Administered 2019-12-09: 20 ug/kg/min via INTRAVENOUS
  Administered 2019-12-09: 5 ug/kg/min via INTRAVENOUS
  Administered 2019-12-10: 30 ug/kg/min via INTRAVENOUS
  Filled 2019-12-09 (×4): qty 100

## 2019-12-09 MED ORDER — ENOXAPARIN SODIUM 100 MG/ML ~~LOC~~ SOLN
90.0000 mg | Freq: Two times a day (BID) | SUBCUTANEOUS | Status: DC
  Administered 2019-12-09 (×2): 90 mg via SUBCUTANEOUS
  Filled 2019-12-09 (×3): qty 0.9

## 2019-12-09 MED ORDER — VECURONIUM BROMIDE 10 MG IV SOLR
INTRAVENOUS | Status: AC
  Filled 2019-12-09: qty 10

## 2019-12-09 MED ORDER — ATORVASTATIN CALCIUM 40 MG PO TABS
40.0000 mg | ORAL_TABLET | Freq: Every day | ORAL | Status: DC
  Administered 2019-12-10 – 2019-12-17 (×8): 40 mg
  Filled 2019-12-09 (×9): qty 1

## 2019-12-09 MED ORDER — PHENYLEPHRINE CONCENTRATED 100MG/250ML (0.4 MG/ML) INFUSION SIMPLE
25.0000 ug/min | INTRAVENOUS | Status: DC
  Administered 2019-12-10: 190 ug/min via INTRAVENOUS
  Administered 2019-12-10: 200 ug/min via INTRAVENOUS
  Filled 2019-12-09: qty 250

## 2019-12-09 MED ORDER — PHENYLEPHRINE HCL-NACL 10-0.9 MG/250ML-% IV SOLN
25.0000 ug/min | INTRAVENOUS | Status: DC
  Administered 2019-12-09: 145 ug/min via INTRAVENOUS
  Administered 2019-12-09: 25 ug/min via INTRAVENOUS
  Filled 2019-12-09: qty 250

## 2019-12-09 MED ORDER — SODIUM BICARBONATE 8.4 % IV SOLN
INTRAVENOUS | Status: AC
  Filled 2019-12-09: qty 50

## 2019-12-09 MED ORDER — SODIUM CHLORIDE 0.9% FLUSH: 10.0000 mL | INTRAVENOUS | Status: DC | PRN

## 2019-12-09 MED ORDER — FENTANYL CITRATE (PF) 100 MCG/2ML IJ SOLN: 25.0000 ug | Freq: Once | INTRAMUSCULAR | Status: DC

## 2019-12-09 MED ORDER — VECURONIUM BROMIDE 10 MG IV SOLR
0.1000 mg/kg | INTRAVENOUS | Status: DC | PRN
  Administered 2019-12-09 – 2019-12-11 (×12): 8.8 mg via INTRAVENOUS
  Filled 2019-12-09 (×3): qty 10

## 2019-12-09 MED ORDER — PHENYLEPHRINE HCL-NACL 10-0.9 MG/250ML-% IV SOLN
INTRAVENOUS | Status: AC
  Filled 2019-12-09: qty 250

## 2019-12-09 MED ORDER — FENTANYL CITRATE (PF) 100 MCG/2ML IJ SOLN
INTRAMUSCULAR | Status: AC
Start: 2019-12-09 — End: 2019-12-09
  Administered 2019-12-09: 50 ug
  Filled 2019-12-09: qty 2

## 2019-12-09 MED ORDER — INSULIN ASPART 100 UNIT/ML ~~LOC~~ SOLN
0.0000 [IU] | SUBCUTANEOUS | Status: DC
  Administered 2019-12-10: 3 [IU] via SUBCUTANEOUS
  Administered 2019-12-11 – 2019-12-15 (×12): 1 [IU] via SUBCUTANEOUS

## 2019-12-09 MED ORDER — SODIUM CHLORIDE 0.9 % IV SOLN
250.0000 mL | INTRAVENOUS | Status: DC
  Administered 2019-12-17: 250 mL via INTRAVENOUS

## 2019-12-09 MED ORDER — MIDAZOLAM HCL 2 MG/2ML IJ SOLN
INTRAMUSCULAR | Status: AC
  Administered 2019-12-09: 2 mg
  Filled 2019-12-09: qty 4

## 2019-12-09 NOTE — Progress Notes (Addendum)
Peripherally Inserted Central Catheter Placement  The IV Nurse has discussed with the patient and/or persons authorized to consent for the patient, the purpose of this procedure and the potential benefits and risks involved with this procedure.  The benefits include less needle sticks, lab draws from the catheter, and the patient may be discharged home with the catheter. Risks include, but not limited to, infection, bleeding, blood clot (thrombus formation), and puncture of an artery; nerve damage and irregular heartbeat and possibility to perform a PICC exchange if needed/ordered by physician.  Alternatives to this procedure were also discussed.  Bard Power PICC patient education guide, fact sheet on infection prevention and patient information card has been provided to patient /or left at bedside.  Phone consent obtain from wife Double lumen PICC placed as no triple lumen PICC available.    PICC Placement Documentation  PICC Double Lumen 12/09/19 PICC Left Brachial 45 cm 0 cm (Active)  Indication for Insertion or Continuance of Line Vasoactive infusions 12/09/19 2234  Exposed Catheter (cm) 0 cm 12/09/19 2234  Site Assessment Clean;Dry;Intact 12/09/19 2234  Lumen #1 Status Flushed;Saline locked;Blood return noted 12/09/19 2234  Lumen #2 Status Flushed;Saline locked;Blood return noted 12/09/19 2234  Dressing Type Occlusive 12/09/19 2234  Dressing Status Clean;Dry;Intact 12/09/19 2234  Antimicrobial disc in place? Not Applicable 71/59/53 9672  Dressing Intervention New dressing 12/09/19 2234  Dressing Change Due 12/11/19 12/09/19 2234       Gordan Payment 12/09/2019, 10:36 PM

## 2019-12-09 NOTE — Consult Note (Signed)
NAME:  Ronald Olsen, MRN:  [id number], DOB:  1950/12/30, LOS: 39 ADMISSION DATE:  11/20/2019, CONSULTATION DATE:  10/4 REFERRING MD:  Doreatha Martin, CHIEF COMPLAINT:  Respiratory failure in setting of COVID-19   Brief History   69 year old male patient admitted on 9/20 with Covid pneumonia. Treated with supplemental oxygen, systemic steroids, remdesivir and baricitinib. In spite of appropriate interventions transferred to the intensive care on 10/4 with progressive respiratory failure  History of present illness   69 year old male patient with history as mentioned below presented to the emergency room on 9/20 with chief complaint of fatigue, cough, and worsening dyspnea over approximately 10-day course with acute worsening of symptoms 3 days prior to presentation.  He actually called EMS due to the degree of his symptom burden, on EMS arrival pulse oximetry noted to be 70% requiring up to 10 L via nonrebreather mask to safely transfer the patient to the hospital. On arrival to the emergency he was hypoxic. CXR w/ bilateral infiltrates. + COVID-19, CRP 6.3, ddimer 1.67, PCT <0.10, BNP was neg.  He was started on high dose steroids, Remdesivirm and baricitinib.    Past Medical History  Hypertension, dyslipidemia. Coronary artery disease,  Significant Hospital Events   9/20:  CXR w/ bilateral infiltrates. + COVID-19, CRP 6.3, ddimer 1.67, PCT <0.10, BNP was neg.  He was started on high dose steroids, Remdesivirm and baricitinib.  9/21: 15 liters high flow.  9/22 CRP down 3.4 but still on high FIO2 9/25 completed day 5 of Remdesivir. Still on high dose steroids and Baricitinib  9/26 increased oxygen demand. Placed on NRB and high flow.  9/28 rapid desaturation w/ any movement  9/29 CT angio: negative for PE  9/30: no better  10/4 worsening rapid response called. PCCM asked to see w/ tranfers to ICU  Consults:  Pulmonary consulted 10/4  Procedures:    Significant Diagnostic Tests:  CT  chest 9/29 showed multifocal groundglass attenuations bilaterally through all lobes Echocardiogram 10/5 >> Intact LV function 59-93%, grade 1 diastolic dysfunction, normal RV function and size  Micro Data:  9/29:SARS/coronavirus 2: Positive. 9/20 blood culture x2: Negative.  Antimicrobials:  Remdesivir 9/20 through 9/25 vanc 10/3>> Ctx 10/3>>10/4 Cefepime 10/4>> azith 10/3>>  Interim history/subjective:   Note elevated d-dimer, inflammatory markers Very labile saturations.  Desaturation to 70s with eating, speaking and depending on position in the bed.   Objective   Blood pressure (!) 147/125, pulse (!) 106, temperature 98 F (36.7 C), temperature source Oral, resp. rate (!) 23, height 5\' 11"  (1.803 m), weight 88 kg, SpO2 (!) 88 %.    FiO2 (%):  [100 %] 100 %   Intake/Output Summary (Last 24 hours) at 12/09/2019 0856 Last data filed at 12/09/2019 5701 Gross per 24 hour  Intake 200 ml  Output 850 ml  Net -650 ml   Filed Weights   11/11/2019 1418 12/08/19 1200 12/09/19 0600  Weight: 99.8 kg 89.4 kg 88 kg    Examination: General: Ill-appearing man in no distress on high flow nasal cannula, 1.00 mask.  Desaturated to 77% on this HENT: Oropharynx clear, no stridor Lungs: Decreased, few mild inspiratory crackles, no wheezing.  Normal respiratory pattern without accessory muscle use Cardiovascular: Tachycardic, no murmur Abdomen: Nondistended, positive bowel sounds Extremities: No edema Neuro: Awake, oriented, interacting appropriately, nonfocal   Resolved Hospital Problem list     Assessment & Plan:  Acute hypoxic respiratory failure in the setting of Covid pneumonia and ARDS -Completed baricitinib and remdesivir regimens -Little else  to do at this point other than supportive measures, have discussed this with the patient he understands.  Confirmed with him today 10/5 that he would want intubation, at least short-term.  He would not want CPR or other forms of  resuscitation Plan Continue to cycle position, side, prone as he can tolerate. Continue supportive measures.  Working to avoid intubation if at all possible, tolerate hypoxemia and make clinical judgment based on work of breathing, mental status, renal function or any evidence for endorgan injuries.  He might be a candidate for BiPAP for work of breathing  Follow chest x-ray Continue prednisone 40 mg twice daily Assess daily for our ability to diurese.  We will give Lasix 40 mg x 1 today 10/5.  I/O- 10 L total Vancomycin, cefepime, azithromycin Agree with increasing enoxaparin to therapeutic dose.  Not sure that there is any indication to repeat his CT scan of the chest at this time.  Would prefer to avoid transportation to the CT scanner given his tenuous status.  Sinus tachycardia Plan Supportive care, treating underlying contributors Continue telemetry  Leukocytosis, suspect steroid-induced Plan Following CBC  Mild hyperglycemia. Suspect steroid-induced Plan Sliding-scale insulin, glucose goal less than 180    Best practice:  Diet: Diet as tolerated Pain/Anxiety/Delirium protocol (if indicated): Not indicated at this point VAP protocol (if indicated): Not indicated at this point DVT prophylaxis: Enoxaparin GI prophylaxis: None Glucose control sliding scale insulin Mobility: Bedrest Code Status: Full code, however if intubated would likely become DNR should he suffer a cardiac arrest Family Communication: Discussed in detail with the patient 10/5 Disposition:  ICU  Labs   CBC: Recent Labs  Lab 12/05/19 0335 12/06/19 0543 12/07/19 0524 12/08/19 0500 12/09/19 0327  WBC 39.1* 30.0* 35.6* 32.1* 29.3*  NEUTROABS 35.6* 27.1* 32.2* 29.2* 27.7*  HGB 14.3 13.9 14.5 14.1 13.9  HCT 42.2 41.8 44.1 41.2 42.0  MCV 91.9 92.9 93.4 91.6 93.5  PLT 338 293 301 261 174    Basic Metabolic Panel: Recent Labs  Lab 12/05/19 0335 12/06/19 0543 12/07/19 0524 12/08/19 0500  12/09/19 0327  NA 130* 132* 134* 131* 135  K 5.1 5.3* 5.0 5.0 5.4*  CL 98 98 96* 97* 100  CO2 21* 25 26 25 23   GLUCOSE 143* 145* 126* 113* 140*  BUN 45* 42* 57* 53* 49*  CREATININE 1.04 0.98 1.06 1.10 1.07  CALCIUM 8.7* 8.9 9.2 8.7* 8.6*  MG  --   --   --  2.4  --   PHOS  --   --   --  3.7  --    GFR: Estimated Creatinine Clearance: 71.4 mL/min (by C-G formula based on SCr of 1.07 mg/dL). Recent Labs  Lab 12/05/19 0335 12/05/19 0335 12/06/19 0543 12/07/19 0524 12/08/19 0500 12/09/19 0327  PROCALCITON <0.10  --  <0.10  --   --   --   WBC 39.1*   < > 30.0* 35.6* 32.1* 29.3*   < > = values in this interval not displayed.    Liver Function Tests: Recent Labs  Lab 12/05/19 0335 12/06/19 0543 12/07/19 0524 12/08/19 0500 12/09/19 0327  AST 33 26 32 37 38  ALT 54* 62* 80* 82* 80*  ALKPHOS 63 66 70 72 80  BILITOT 0.9 1.0 0.8 0.9 1.0  PROT 6.3* 6.5 6.8 6.4* 6.7  ALBUMIN 2.8* 2.8* 2.8* 2.4* 2.5*   No results for input(s): LIPASE, AMYLASE in the last 168 hours. No results for input(s): AMMONIA in the last 168 hours.  ABG No results found for: PHART, PCO2ART, PO2ART, HCO3, TCO2, ACIDBASEDEF, O2SAT   Coagulation Profile: No results for input(s): INR, PROTIME in the last 168 hours.  Cardiac Enzymes: No results for input(s): CKTOTAL, CKMB, CKMBINDEX, TROPONINI in the last 168 hours.  HbA1C: Hgb A1c MFr Bld  Date/Time Value Ref Range Status  11/25/2019 05:59 AM 5.9 (H) 4.8 - 5.6 % Final    Comment:    (NOTE) Pre diabetes:          5.7%-6.4%  Diabetes:              >6.4%  Glycemic control for   <7.0% adults with diabetes     CBG: Recent Labs  Lab 12/08/19 0818 12/08/19 1213 12/08/19 1702 12/08/19 2144 12/09/19 0806  GLUCAP 133* 116* 111* 136* 124*     Critical care time: 33 min    Baltazar Apo, MD, PhD 12/09/2019, 9:35 AM [facility] [phone] or if no answer [phone]

## 2019-12-09 NOTE — Progress Notes (Signed)
eLink Physician-Brief Progress Note Patient Name: Ronald Olsen DOB: May 18, 1950 MRN: [id number]   Date of Service  12/09/2019  HPI/Events of Note  Hypotension - BP = 79/46 with MAP = 55 and HR = 118 s/p intubation.   eICU Interventions  Plan: 1. Bolus with 0.9 NaCl 1 liter IV over 1 hour now. 2. Phenylephrine IV infusion via PIC. Titrate to MAP > 65. 3. Portable CXR STAT - s/p intubation. 4. ABG at 10 PM.  5. Ventilator settings: 100%/PRVC 30/TV 450/P 10.     Intervention Category Major Interventions: Hypotension - evaluation and management  Tou Hayner Eugene 12/09/2019, 9:04 PM

## 2019-12-09 NOTE — Progress Notes (Signed)
°  Echocardiogram 2D Echocardiogram has been performed.  Ronald Olsen 12/09/2019, 9:09 AM

## 2019-12-09 NOTE — Progress Notes (Signed)
Nutrition Follow-up  DOCUMENTATION CODES:   Obesity unspecified  INTERVENTION:  - continue Ensure Enlive BID, each supplement provides 350 kcal and 20 grams of protein - will Will order 30 ml Prosource Plus BID, each supplement provides 100 kcal and 15 grams protein.   NUTRITION DIAGNOSIS:   Increased nutrient needs related to acute illness (COVID-19 infection) as evidenced by estimated needs. -ongoing  GOAL:   Patient will meet greater than or equal to 90% of their needs -minimally met on average   MONITOR:   PO intake, Supplement acceptance, Labs, Weight trends, I & O's  ASSESSMENT:   69 year old male with a past medical history for hypertension, dyslipidemia who presents with dyspnea, malaise, fatigue and cough for about 10 days.  Acute worsening of his symptoms over the last 72 hours prior to hospitalization.  Multiple sick contacts at home, he has not been vaccinated.Patient admitted to the hospital with working diagnosis of acute hypoxic respiratory failure due to SARS COVID-19 viral pneumonia.  He has been eating mainly 75-100% since 10/1. He has been accepting Ensure Enlive ~75% of the time offered.   Weight is down from admission, but admission weight appears to have been a stated weight.   Per notes: - COVID PNA and ARDS - self-proning as tolerated - mild hyperglycemia    Labs reviewed; CBG: 124 mg/dl, K: 5.4 mmol/l, BUN: 49 mg/dl, Ca: 8.6 mg/dl, ALT elevated.  Medications reviewed; sliding scale novolog, 40 mg solu-medrol BID, 40 mg oral protonix/day.    Diet Order:   Diet Order            Diet heart healthy/carb modified Room service appropriate? Yes; Fluid consistency: Thin  Diet effective now                 EDUCATION NEEDS:   No education needs have been identified at this time  Skin:  Skin Assessment: Reviewed RN Assessment  Last BM:  10/3  Height:   Ht Readings from Last 1 Encounters:  12/08/19 _0  (1.803 m)    Weight:   Wt  Readings from Last 1 Encounters:  12/09/19 88 kg    Estimated Nutritional Needs:  Kcal:  2300-2500 Protein:  100-115g Fluid:  2L/day      Jarome Matin, MS, RD, LDN, CNSC Inpatient Clinical Dietitian RD pager # available in [facility]  After hours/weekend pager # available in [facility]

## 2019-12-09 NOTE — Anesthesia Procedure Notes (Signed)
Procedure Name: Intubation Date/Time: 12/09/2019 12:53 PM Performed by: [place] Demiya Magno C, CRNA Pre-anesthesia Checklist: Patient identified, Emergency Drugs available, Suction available and Patient being monitored Patient Re-evaluated:Patient Re-evaluated prior to induction Oxygen Delivery Method: Circle system utilized Preoxygenation: Pre-oxygenation with 100% oxygen Induction Type: IV induction Laryngoscope Size: Mac, 4 and Glidescope Grade View: Grade I Tube type: Oral Number of attempts: 1 Airway Equipment and Method: Stylet Placement Confirmation: ETT inserted through vocal cords under direct vision,  positive ETCO2 and breath sounds checked- equal and bilateral Secured at: 24 cm Tube secured with: Tape Dental Injury: Teeth and Oropharynx as per pre-operative assessment

## 2019-12-09 NOTE — Progress Notes (Signed)
PT Cancellation Note  Patient Details Name: Ronald Olsen MRN: [id number] DOB: May 08, 1950   Cancelled Treatment:    Reason Eval/Treat Not Completed:  Will hold PT today and continue to follow-pt does not appear to be able to tolerate much activity presently.     [facility]  Office: [phone] Pager: [phone]

## 2019-12-09 NOTE — TOC Progression Note (Signed)
Transition of Care [facility]) - Progression Note    Patient Details  Name: Ronald Olsen MRN: [id number] Date of Birth: Dec 31, 1950  [facility] CM/SW Contact  Leeroy Cha, RN Phone Number: 12/09/2019, 8:44 AM  Clinical Narrative:    Transferred to icu on [id number] pm due to increased wob, covid + ,hfnrb at 40l/min, iv solu medrol, iv maxipime, K+=5.4, wbc 29.3, d. Dimer =4.86, Following for progression and toc needs.        Expected Discharge Plan and Services                                                 Social Determinants of Health (SDOH) Interventions    Readmission Risk Interventions No flowsheet data found.

## 2019-12-09 NOTE — Progress Notes (Signed)
[facility] Progress Note Patient Name: Ronald Olsen DOB: [birth year] MRN: [id number]   Date of Service  12/09/2019  HPI/Events of Note  ABG on 100%/PRVC 30/TV 450/P 10 = 7.153/68.5/90.8  eICU Interventions  Plan: 1. Increase PRVC rate to 35. 2. NaHCO3 100 meq IV X 1 now. 3. NaHCO3 IV infusion to run IV at 50 mL/hour.  4. Repeat ABG at 5 AM.      Intervention Category Major Interventions: Acid-Base disturbance - evaluation and management;Respiratory failure - evaluation and management  Lajada Janes Eugene 12/09/2019, 11:09 PM

## 2019-12-09 NOTE — Progress Notes (Signed)
Wife called at this time to give routine update. Wife asking about pt's oxygen saturation status and general condition. All questions answered and assured wife that we would continue to keep her updated as well as provided the nursing station number for her to call if needed.

## 2019-12-09 NOTE — Progress Notes (Signed)
PROGRESS NOTE    Ronald Olsen  TFT:[id number] DOB: 1950/07/05 DOA: 11/06/2019 PCP: Pcp, No    Brief Narrative: 69 year old male with past medical history for hypertension, dyslipidemia who presented with dyspnea, malaise, fatigue and cough for about 10 days. Acute worsening of his symptoms over the last 72 hours prior to hospitalization. Multiple sick contacts at home, he has not been vaccinated. Due to severe symptoms he called EMS, his oxygen saturation was in the 70s and he was placed on a 10 L nonrebreather mask and transported to the hospital. On his initial physical examination he was afebrile, but tachycardic and tachypneic, requiring 6 L/min of supplemental oxygen per nasal cannula. His lungs had no wheezing but positive tachypnea, heart S1-S2, present, tachycardic, abdomen soft, no lower extremity edema. Chest radiograph with bilateral infiltrates at the lower lobes, peripherally, more left than right.  He has been placed on aggressive medical therapy with high dose systemic steroids, IV remdesivir and oral baricitinib. Patient with persistent high oxygen requirements despite maximum therapies.  10/1-10/3: Persistent high requirement of oxygen.  WBC count more than 35,000 with left shift.  Procalcitonin less than 0.1.  Repeat chest x-ray with persistent consolidation. Started on broad-spectrum antibiotics.  Episodes of hypotension. 10/3-10/4: Rapid response 10/4 morning, high oxygen requirement , moved to ICU for impending intubation. Remains in ICU with high oxygen requirement.  Assessment & Plan:   Principal Problem:   Acute hypoxemic respiratory failure due to COVID-19 [facility], [place]) Active Problems:   CAD (coronary artery disease)   Essential hypertension   Hyponatremia   Hyperlipidemia  Acute hypoxemic respiratory failure due to COVID-19 virus pneumonia: Remains critically ill, on very high oxygen requirement. chest physiotherapy, incentive spirometry, deep breathing  exercises, sputum induction, mucolytic's and bronchodilators as much as he can do. Mobility as tolerated.  Difficult to tolerate mobility. Supplemental oxygen to keep saturations more than 82 %. Covid directed therapy with , steroids, remains on Solu-Medrol. remdesivir, finished 5 days of therapy Baricitinib, completed 14 days of therapy. Due to severity of symptoms, patient will need daily inflammatory markers, chest x-rays, liver function test to monitor and direct COVID-19 therapies. CTA 9/29 with extensive pneumonia, no PE. Trying intermittent Lasix.  Holding today with tachycardia and low blood pressure. Chest x-ray, 10/3 with worsening right lower lobe infiltrate. With persistent elevated WBC count and worsening hypoxemia with new right lower lobe infiltrate, started on broad-spectrum antibiotics with Rocephin, azithromycin and vancomycin on 10/3.  We will continue until clinical improvement.  Procalcitonin was normal. 10/5, elevated D-dimer.  Unstable to go for CT scan.  Will check lower extremity duplexes.  Increase Lovenox to therapeutic dose 1 mg twice a day.  COVID-19 Labs  Recent Labs    12/07/19 0524 12/09/19 0327  DDIMER 1.39* 4.86*  FERRITIN 1,745*  --   CRP 9.1*  9.5*  --     Lab Results  Component Value Date   SARSCOV2NAA POSITIVE (A) 11/15/2019   SpO2: 90 % O2 Flow Rate (L/min): 60 L/min FiO2 (%): 100 %  Hypertension: Blood pressure low normal.  Holding off on diuretics.  History of coronary artery disease: On dual antiplatelet therapy with aspirin and Plavix. Continue.  Echocardiogram is fairly normal with normal ejection fraction.  No wall motion abnormality.  Acute kidney injury on chronic kidney disease stage II with hyponatremia and non-anion gap metabolic acidosis and hyperkalemia: Renal function stable.  Type 2 diabetes: On sliding scale insulin while on steroids.  DVT prophylaxis: Lovenox therapeutic.   Code  Status: Partial code.  Okay to  intubate for respiratory support but no cardiac resuscitation. Family Communication: Wife updated on the phone.   Disposition Plan: Status is: Inpatient.  Patient remains critically ill, now transferred to ICU.  Remains inpatient appropriate because:Inpatient level of care appropriate due to severity of illness   Dispo:  Patient From: Home  Planned Disposition: Home with Health Care Svc  Expected discharge date: Unknown .  medically stable for discharge: No          Consultants:   PCCM.  Procedures:   None  Antimicrobials:   Remdesivir, finished therapy  .  Rocephin 10/3--   Vancomycin 10/3 ---  Azithromycin 10/3 ---   Subjective: Seen and examined.  Looks tired.  Patient states he is okay and he thinks his breathing okay at rest.  He was trying to take few bites of scrambled egg and his saturation would drop.  Overnight remains anywhere on 40 to 60 L of heated high flow oxygen.  Objective: Vitals:   12/09/19 0900 12/09/19 0902 12/09/19 1000 12/09/19 1100  BP: (!) 62/49     Pulse: (!) 110  96   Resp:      Temp:    97.7 F (36.5 C)  TempSrc:    Axillary  SpO2: (!) 69% (!) 80% 90%   Weight:      Height:        Intake/Output Summary (Last 24 hours) at 12/09/2019 1312 Last data filed at 12/09/2019 0800 Gross per 24 hour  Intake 300 ml  Output 850 ml  Net -550 ml   Filed Weights   12/04/2019 1418 12/08/19 1200 12/09/19 0600  Weight: 99.8 kg 89.4 kg 88 kg    Examination:  General exam: Sick looking, in moderate respiratory distress, conversational dyspnea present. Respiratory system: Poor air entry bilateral.  No added sounds. Cardiovascular system: No added sounds.  S1-S2 heard.  Tachycardic. Gastrointestinal system: Abdomen is nondistended, soft and nontender. No organomegaly or masses felt. Normal bowel sounds heard. Central nervous system: Alert and oriented. No focal neurological deficits. Extremities: Symmetric 5 x 5 power. Skin: No rashes,  lesions or ulcers Psychiatry: Judgement and insight appear normal. Mood & affect anxious.    Data Reviewed: I have personally reviewed following labs and imaging studies  CBC: Recent Labs  Lab 12/05/19 0335 12/06/19 0543 12/07/19 0524 12/08/19 0500 12/09/19 0327  WBC 39.1* 30.0* 35.6* 32.1* 29.3*  NEUTROABS 35.6* 27.1* 32.2* 29.2* 27.7*  HGB 14.3 13.9 14.5 14.1 13.9  HCT 42.2 41.8 44.1 41.2 42.0  MCV 91.9 92.9 93.4 91.6 93.5  PLT 338 293 301 261 163   Basic Metabolic Panel: Recent Labs  Lab 12/05/19 0335 12/06/19 0543 12/07/19 0524 12/08/19 0500 12/09/19 0327  NA 130* 132* 134* 131* 135  K 5.1 5.3* 5.0 5.0 5.4*  CL 98 98 96* 97* 100  CO2 21* 25 26 25 23   GLUCOSE 143* 145* 126* 113* 140*  BUN 45* 42* 57* 53* 49*  CREATININE 1.04 0.98 1.06 1.10 1.07  CALCIUM 8.7* 8.9 9.2 8.7* 8.6*  MG  --   --   --  2.4  --   PHOS  --   --   --  3.7  --    GFR: Estimated Creatinine Clearance: 71.4 mL/min (by C-G formula based on SCr of 1.07 mg/dL). Liver Function Tests: Recent Labs  Lab 12/05/19 0335 12/06/19 0543 12/07/19 0524 12/08/19 0500 12/09/19 0327  AST 33 26 32 37 38  ALT 54* 62*  80* 82* 80*  ALKPHOS 63 66 70 72 80  BILITOT 0.9 1.0 0.8 0.9 1.0  PROT 6.3* 6.5 6.8 6.4* 6.7  ALBUMIN 2.8* 2.8* 2.8* 2.4* 2.5*   No results for input(s): LIPASE, AMYLASE in the last 168 hours. No results for input(s): AMMONIA in the last 168 hours. Coagulation Profile: No results for input(s): INR, PROTIME in the last 168 hours. Cardiac Enzymes: No results for input(s): CKTOTAL, CKMB, CKMBINDEX, TROPONINI in the last 168 hours. BNP (last 3 results) No results for input(s): PROBNP in the last 8760 hours. HbA1C: No results for input(s): HGBA1C in the last 72 hours. CBG: Recent Labs  Lab 12/08/19 1213 12/08/19 1702 12/08/19 2144 12/09/19 0806 12/09/19 1159  GLUCAP 116* 111* 136* 124* 112*   Lipid Profile: No results for input(s): CHOL, HDL, LDLCALC, TRIG, CHOLHDL, LDLDIRECT in  the last 72 hours. Thyroid Function Tests: No results for input(s): TSH, T4TOTAL, FREET4, T3FREE, THYROIDAB in the last 72 hours. Anemia Panel: Recent Labs    12/07/19 0524  FERRITIN 1,745*   Sepsis Labs: Recent Labs  Lab 12/05/19 0335 12/06/19 0543  PROCALCITON <0.10 <0.10    Recent Results (from the past 240 hour(s))  MRSA PCR Screening     Status: None   Collection Time: 12/07/19  7:35 AM   Specimen: Nasal Mucosa; Nasopharyngeal  Result Value Ref Range Status   MRSA by PCR NEGATIVE NEGATIVE Final    Comment:        The GeneXpert MRSA Assay (FDA approved for NASAL specimens only), is one component of a comprehensive MRSA colonization surveillance program. It is not intended to diagnose MRSA infection nor to guide or monitor treatment for MRSA infections. Performed at [facility], [place] [address], [place], [place] [address]   MRSA PCR Screening     Status: None   Collection Time: 12/08/19 12:11 PM   Specimen: Nasal Mucosa; Nasopharyngeal  Result Value Ref Range Status   MRSA by PCR NEGATIVE NEGATIVE Final    Comment:        The GeneXpert MRSA Assay (FDA approved for NASAL specimens only), is one component of a comprehensive MRSA colonization surveillance program. It is not intended to diagnose MRSA infection nor to guide or monitor treatment for MRSA infections. Performed at [facility], [place] [address] [place], [place] [address]          Radiology Studies: DG CHEST PORT 1 VIEW  Result Date: 12/07/2019 CLINICAL DATA:  Increase in work of breathing. Pt is positive for Covid-19. H/o HTN. Former smoker. EXAM: PORTABLE CHEST 1 VIEW COMPARISON:  Chest radiograph 12/06/2019 FINDINGS: Stable cardiomediastinal contours. Low lung volumes. Diffuse infiltrates in the right lung, increased peripherally. Persistent infiltrates in the lower left lung. No pneumothorax or significant pleural effusion. No acute finding in the  visualized skeleton. IMPRESSION: Increasing infiltrates in the peripheral right lung. Electronically Signed   By: Audie Pinto M.D.   On: 12/07/2019 17:47   ECHOCARDIOGRAM COMPLETE  Result Date: 12/09/2019    ECHOCARDIOGRAM REPORT   Patient Name:   KITO CUFFE Date of Exam: 12/09/2019 Medical Rec #:  [id number]           Height:       71.0 in Accession #:    [id number]          Weight:       194.0 lb Date of Birth:  15-Feb-1951           BSA:  2.081 m Patient Age:    63 years            BP:           147/125 mmHg Patient Gender: M                   HR:           103 bpm. Exam Location:  Inpatient Procedure: 2D Echo Indications:    dyspnea 786.09  History:        Patient has no prior history of Echocardiogram examinations.                 Risk Factors:Hypertension, Dyslipidemia and Former Smoker. Covid                 +.  Sonographer:    Jannett Celestine RDCS (AE) Referring Phys: [place] Comments: Suboptimal subcostal window. Image acquisition challenging due to respiratory motion. IMPRESSIONS  1. Left ventricular ejection fraction, by estimation, is 65 to 70%. The left ventricle has normal function. The left ventricle has no regional wall motion abnormalities. Left ventricular diastolic parameters are consistent with Grade I diastolic dysfunction (impaired relaxation).  2. Right ventricular systolic function is normal. The right ventricular size is normal.  3. The mitral valve is normal in structure. Trivial mitral valve regurgitation. No evidence of mitral stenosis.  4. The aortic valve is normal in structure. Aortic valve regurgitation is not visualized. No aortic stenosis is present. FINDINGS  Left Ventricle: Left ventricular ejection fraction, by estimation, is 65 to 70%. The left ventricle has normal function. The left ventricle has no regional wall motion abnormalities. The left ventricular internal cavity size was normal in size. There is  no left ventricular  hypertrophy. Left ventricular diastolic parameters are consistent with Grade I diastolic dysfunction (impaired relaxation). Normal left ventricular filling pressure. Right Ventricle: The right ventricular size is normal. No increase in right ventricular wall thickness. Right ventricular systolic function is normal. Left Atrium: Left atrial size was normal in size. Right Atrium: Right atrial size was normal in size. Pericardium: There is no evidence of pericardial effusion. Mitral Valve: The mitral valve is normal in structure. Trivial mitral valve regurgitation. No evidence of mitral valve stenosis. Tricuspid Valve: The tricuspid valve is normal in structure. Tricuspid valve regurgitation is mild . No evidence of tricuspid stenosis. Aortic Valve: The aortic valve is normal in structure. Aortic valve regurgitation is not visualized. No aortic stenosis is present. Pulmonic Valve: The pulmonic valve was normal in structure. Pulmonic valve regurgitation is trivial. No evidence of pulmonic stenosis. Aorta: The aortic root is normal in size and structure. Venous: The inferior vena cava was not well visualized. IAS/Shunts: No atrial level shunt detected by color flow Doppler.  LEFT VENTRICLE PLAX 2D LVIDd:         2.90 cm  Diastology LVIDs:         1.80 cm  LV e' medial:    6.85 cm/s LV PW:         1.00 cm  LV E/e' medial:  8.6 LV IVS:        1.00 cm  LV e' lateral:   10.20 cm/s LVOT diam:     2.10 cm  LV E/e' lateral: 5.8 LV SV:         78 LV SV Index:   38 LVOT Area:     3.46 cm  RIGHT VENTRICLE RV S prime:  15.40 cm/s TAPSE (M-mode): 1.9 cm LEFT ATRIUM           Index LA diam:      3.10 cm 1.49 cm/m LA Vol (A2C): 54.5 ml 26.18 ml/m  AORTIC VALVE LVOT Vmax:   117.00 cm/s LVOT Vmean:  93.700 cm/s LVOT VTI:    0.226 m  AORTA Ao Root diam: 2.90 cm MITRAL VALVE               TRICUSPID VALVE MV Area (PHT): 3.27 cm    TR Peak grad:   40.4 mmHg MV Decel Time: 232 msec    TR Vmax:        318.00 cm/s MV E velocity: 58.70  cm/s MV A velocity: 73.30 cm/s  SHUNTS MV E/A ratio:  0.80        Systemic VTI:  0.23 m                            Systemic Diam: 2.10 cm Skeet Latch MD Electronically signed by Skeet Latch MD Signature Date/Time: 12/09/2019/9:26:40 AM    Final         Scheduled Meds: . (feeding supplement) PROSource Plus  30 mL Oral BID BM  . alfuzosin  10 mg Oral Q breakfast  . aspirin EC  81 mg Oral Daily  . atorvastatin  40 mg Oral Daily  . azithromycin  500 mg Oral Daily  . chlorhexidine  15 mL Mouth Rinse BID  . Chlorhexidine Gluconate Cloth  6 each Topical Daily  . chlorpheniramine-HYDROcodone  5 mL Oral Q12H  . clopidogrel  75 mg Oral Daily  . cyclobenzaprine  10 mg Oral BID  . enoxaparin (LOVENOX) injection  90 mg Subcutaneous Q12H  . feeding supplement (ENSURE ENLIVE)  237 mL Oral BID BM  . finasteride  5 mg Oral Daily  . insulin aspart  0-5 Units Subcutaneous QHS  . insulin aspart  0-9 Units Subcutaneous TID WC  . Ipratropium-Albuterol  1 puff Inhalation QID  . mouth rinse  15 mL Mouth Rinse q12n4p  . methylPREDNISolone (SOLU-MEDROL) injection  40 mg Intravenous Q12H  . pantoprazole  40 mg Oral Daily  . sodium chloride flush  3 mL Intravenous Q12H   Continuous Infusions: . ceFEPime (MAXIPIME) IV Stopped (12/09/19 6195)     LOS: 15 days    Time spent: 45 minutes    Barb Merino, MD [facility] Pager [phone]

## 2019-12-09 NOTE — Progress Notes (Signed)
eLink Physician-Brief Progress Note Patient Name: Ronald Olsen DOB: 10/14/1950 MRN: [id number]   Date of Service  12/09/2019  HPI/Events of Note  Called by RT d/t concern for large cuff leak . Patient re-intubated at shift change with 7.5 ETT. Now with TV = 450 mL the patient only returns 193 mL. Post intubation CXR reveals   eICU Interventions  Plan: 1. Called Anesthesia on-call at [facility] --> No answer.  2. PCCM ground team advised of situation.   Spoke with bedside nurse, they will locate aneathesia and hove them address the cuff leak.      Intervention Category Major Interventions: Other:  Nyheem Binette Cornelia Copa 12/09/2019, 8:02 PM

## 2019-12-09 NOTE — Procedures (Signed)
Intubation Procedure Note  Miller Limehouse  [id number]  July 01, 1950  Date:12/09/19  Time:4:36 PM   Provider Performing:Nijae Doyel S Hiawatha Dressel    Procedure: Intubation (31500)  Indication(s) Respiratory Failure  Consent Risks of the procedure as well as the alternatives and risks of each were explained to the patient and/or caregiver.  Consent for the procedure was obtained and is signed in the bedside chart   Anesthesia Etomidate, Versed, Fentanyl and Rocuronium   Time Out Verified patient identification, verified procedure, site/side was marked, verified correct patient position, special equipment/implants available, medications/allergies/relevant history reviewed, required imaging and test results available.   Sterile Technique Usual hand hygeine, masks, and gloves were used   Procedure Description Patient positioned in bed supine.  Sedation given as noted above.  Patient was intubated with endotracheal tube using Glidescope.  View was Grade 1 full glottis . Number of attempts was 2.    8.0 ET tube placed using glide scope with grade 1 view.  Direct visualization of tube past cords, but end-tidal CO2 detector was negative and air noted from oropharynx with bagging.  Re-looked with glide scope and ET tube had dislodged from airway.  This was removed and a new 7.5 ET tube was replaced in similar fashion.  Colorimetric CO2 detector was consistent with tracheal placement.   Complications/Tolerance desaturation as soon as mask removed, to low 60's% Chest X-ray is ordered to verify placement.   EBL none   Specimen(s) None   Baltazar Apo, MD, PhD 12/09/2019, 4:37 PM [facility] [phone] or if no answer [phone]

## 2019-12-10 ENCOUNTER — Encounter (HOSPITAL_COMMUNITY): Payer: Medicare Other

## 2019-12-10 ENCOUNTER — Inpatient Hospital Stay (HOSPITAL_COMMUNITY): Payer: Medicare Other

## 2019-12-10 DIAGNOSIS — U071 COVID-19: Secondary | ICD-10-CM

## 2019-12-10 DIAGNOSIS — J9601 Acute respiratory failure with hypoxia: Secondary | ICD-10-CM | POA: Diagnosis not present

## 2019-12-10 DIAGNOSIS — J80 Acute respiratory distress syndrome: Secondary | ICD-10-CM

## 2019-12-10 LAB — CBC
HCT: 37.2 % — ABNORMAL LOW (ref 39.0–52.0)
Hemoglobin: 12 g/dL — ABNORMAL LOW (ref 13.0–17.0)
MCH: 31.3 pg (ref 26.0–34.0)
MCHC: 32.3 g/dL (ref 30.0–36.0)
MCV: 96.9 fL (ref 80.0–100.0)
Platelets: 190 10*3/uL (ref 150–400)
RBC: 3.84 MIL/uL — ABNORMAL LOW (ref 4.22–5.81)
RDW: 13.2 % (ref 11.5–15.5)
WBC: 29 10*3/uL — ABNORMAL HIGH (ref 4.0–10.5)
nRBC: 0 % (ref 0.0–0.2)

## 2019-12-10 LAB — GLUCOSE, CAPILLARY
Glucose-Capillary: 100 mg/dL — ABNORMAL HIGH (ref 70–99)
Glucose-Capillary: 102 mg/dL — ABNORMAL HIGH (ref 70–99)
Glucose-Capillary: 110 mg/dL — ABNORMAL HIGH (ref 70–99)
Glucose-Capillary: 113 mg/dL — ABNORMAL HIGH (ref 70–99)
Glucose-Capillary: 116 mg/dL — ABNORMAL HIGH (ref 70–99)
Glucose-Capillary: 98 mg/dL (ref 70–99)

## 2019-12-10 LAB — BASIC METABOLIC PANEL
Anion gap: 10 (ref 5–15)
Anion gap: 12 (ref 5–15)
BUN: 71 mg/dL — ABNORMAL HIGH (ref 8–23)
BUN: 82 mg/dL — ABNORMAL HIGH (ref 8–23)
CO2: 29 mmol/L (ref 22–32)
CO2: 28 mmol/L (ref 22–32)
Calcium: 7.6 mg/dL — ABNORMAL LOW (ref 8.9–10.3)
Calcium: 7.6 mg/dL — ABNORMAL LOW (ref 8.9–10.3)
Chloride: 101 mmol/L (ref 98–111)
Chloride: 104 mmol/L (ref 98–111)
Creatinine, Ser: 2.47 mg/dL — ABNORMAL HIGH (ref 0.61–1.24)
Creatinine, Ser: 2.71 mg/dL — ABNORMAL HIGH (ref 0.61–1.24)
GFR calc non Af Amer: 26 mL/min — ABNORMAL LOW (ref >60)
GFR calc non Af Amer: 23 mL/min — ABNORMAL LOW (ref >60)
Glucose, Bld: 125 mg/dL — ABNORMAL HIGH (ref 70–99)
Glucose, Bld: 114 mg/dL — ABNORMAL HIGH (ref 70–99)
Potassium: 5.6 mmol/L — ABNORMAL HIGH (ref 3.5–5.1)
Potassium: 5.2 mmol/L — ABNORMAL HIGH (ref 3.5–5.1)
Sodium: 140 mmol/L (ref 135–145)
Sodium: 144 mmol/L (ref 135–145)

## 2019-12-10 LAB — BLOOD GAS, ARTERIAL
Acid-Base Excess: 4.3 mmol/L — ABNORMAL HIGH (ref 0.0–2.0)
Acid-base deficit: 0.2 mmol/L (ref 0.0–2.0)
Bicarbonate: 27.8 mmol/L (ref 20.0–28.0)
Bicarbonate: 30.3 mmol/L — ABNORMAL HIGH (ref 20.0–28.0)
FIO2: 100
O2 Saturation: 93.8 %
O2 Saturation: 93.9 %
Patient temperature: 97.7
Patient temperature: 98.6
pCO2 arterial: 62.4 mmHg — ABNORMAL HIGH (ref 32.0–48.0)
pCO2 arterial: 54.8 mmHg — ABNORMAL HIGH (ref 32.0–48.0)
pH, Arterial: 7.268 — ABNORMAL LOW (ref 7.350–7.450)
pH, Arterial: 7.361 (ref 7.350–7.450)
pO2, Arterial: 83 mmHg (ref 83.0–108.0)
pO2, Arterial: 78.5 mmHg — ABNORMAL LOW (ref 83.0–108.0)

## 2019-12-10 LAB — COMPREHENSIVE METABOLIC PANEL
ALT: 76 U/L — ABNORMAL HIGH (ref 0–44)
AST: 33 U/L (ref 15–41)
Albumin: 2.2 g/dL — ABNORMAL LOW (ref 3.5–5.0)
Alkaline Phosphatase: 78 U/L (ref 38–126)
Anion gap: 9 (ref 5–15)
BUN: 66 mg/dL — ABNORMAL HIGH (ref 8–23)
CO2: 27 mmol/L (ref 22–32)
Calcium: 7.9 mg/dL — ABNORMAL LOW (ref 8.9–10.3)
Chloride: 104 mmol/L (ref 98–111)
Creatinine, Ser: 2.16 mg/dL — ABNORMAL HIGH (ref 0.61–1.24)
GFR calc non Af Amer: 31 mL/min — ABNORMAL LOW (ref >60)
Glucose, Bld: 126 mg/dL — ABNORMAL HIGH (ref 70–99)
Potassium: 6.4 mmol/L (ref 3.5–5.1)
Sodium: 140 mmol/L (ref 135–145)
Total Bilirubin: 0.9 mg/dL (ref 0.3–1.2)
Total Protein: 5.9 g/dL — ABNORMAL LOW (ref 6.5–8.1)

## 2019-12-10 LAB — TYPE AND SCREEN
ABO/RH(D): A POS
Antibody Screen: NEGATIVE

## 2019-12-10 LAB — CBC WITH DIFFERENTIAL/PLATELET
Abs Immature Granulocytes: 1.13 10*3/uL — ABNORMAL HIGH (ref 0.00–0.07)
Basophils Absolute: 0.1 10*3/uL (ref 0.0–0.1)
Basophils Relative: 0 %
Eosinophils Absolute: 0 10*3/uL (ref 0.0–0.5)
Eosinophils Relative: 0 %
HCT: 37.8 % — ABNORMAL LOW (ref 39.0–52.0)
Hemoglobin: 12.4 g/dL — ABNORMAL LOW (ref 13.0–17.0)
Immature Granulocytes: 3 %
Lymphocytes Relative: 1 %
Lymphs Abs: 0.4 10*3/uL — ABNORMAL LOW (ref 0.7–4.0)
MCH: 30.8 pg (ref 26.0–34.0)
MCHC: 32.8 g/dL (ref 30.0–36.0)
MCV: 94 fL (ref 80.0–100.0)
Monocytes Absolute: 2.2 10*3/uL — ABNORMAL HIGH (ref 0.1–1.0)
Monocytes Relative: 5 %
Neutro Abs: 40.9 10*3/uL — ABNORMAL HIGH (ref 1.7–7.7)
Neutrophils Relative %: 91 %
Platelets: 258 10*3/uL (ref 150–400)
RBC: 4.02 MIL/uL — ABNORMAL LOW (ref 4.22–5.81)
RDW: 13 % (ref 11.5–15.5)
WBC: 44.8 10*3/uL — ABNORMAL HIGH (ref 4.0–10.5)
nRBC: 0 % (ref 0.0–0.2)

## 2019-12-10 LAB — ABO/RH: ABO/RH(D): A POS

## 2019-12-10 LAB — PROTIME-INR
INR: 1.4 — ABNORMAL HIGH (ref 0.8–1.2)
Prothrombin Time: 16.6 seconds — ABNORMAL HIGH (ref 11.4–15.2)

## 2019-12-10 LAB — D-DIMER, QUANTITATIVE: D-Dimer, Quant: 2.46 ug/mL-FEU — ABNORMAL HIGH (ref 0.00–0.50)

## 2019-12-10 LAB — TRIGLYCERIDES: Triglycerides: 153 mg/dL — ABNORMAL HIGH (ref <150)

## 2019-12-10 LAB — APTT: aPTT: 34 seconds (ref 24–36)

## 2019-12-10 MED ORDER — SODIUM CHLORIDE 0.9 % IV SOLN
2.0000 g | Freq: Two times a day (BID) | INTRAVENOUS | Status: AC
  Administered 2019-12-11 – 2019-12-13 (×7): 2 g via INTRAVENOUS
  Filled 2019-12-10 (×7): qty 2

## 2019-12-10 MED ORDER — METHYLPREDNISOLONE SODIUM SUCC 40 MG IJ SOLR
40.0000 mg | Freq: Every day | INTRAMUSCULAR | Status: DC
  Filled 2019-12-10: qty 1

## 2019-12-10 MED ORDER — SODIUM BICARBONATE 8.4 % IV SOLN
INTRAVENOUS | Status: AC
  Filled 2019-12-10: qty 50

## 2019-12-10 MED ORDER — VITAL AF 1.2 CAL PO LIQD
1000.0000 mL | ORAL | Status: AC
  Administered 2019-12-10 – 2019-12-14 (×6): 1000 mL

## 2019-12-10 MED ORDER — ENOXAPARIN SODIUM 100 MG/ML ~~LOC~~ SOLN: 90.0000 mg | SUBCUTANEOUS | Status: DC

## 2019-12-10 MED ORDER — MIDAZOLAM BOLUS VIA INFUSION
1.0000 mg | INTRAVENOUS | Status: DC | PRN
  Administered 2019-12-11 – 2019-12-12 (×4): 2 mg via INTRAVENOUS
  Administered 2019-12-12: 1 mg via INTRAVENOUS
  Administered 2019-12-12 (×2): 2 mg via INTRAVENOUS
  Administered 2019-12-12: 1 mg via INTRAVENOUS
  Administered 2019-12-12 (×2): 2 mg via INTRAVENOUS
  Administered 2019-12-13: 1 mg via INTRAVENOUS
  Filled 2019-12-10: qty 2

## 2019-12-10 MED ORDER — SODIUM BICARBONATE 8.4 % IV SOLN
100.0000 meq | Freq: Once | INTRAVENOUS | Status: AC
  Administered 2019-12-10: 100 meq via INTRAVENOUS
  Filled 2019-12-10: qty 50

## 2019-12-10 MED ORDER — SODIUM CHLORIDE 0.9% IV SOLUTION: Freq: Once | INTRAVENOUS | Status: DC

## 2019-12-10 MED ORDER — INSULIN ASPART 100 UNIT/ML IV SOLN
10.0000 [IU] | Freq: Once | INTRAVENOUS | Status: AC
  Administered 2019-12-10: 10 [IU] via INTRAVENOUS

## 2019-12-10 MED ORDER — NOREPINEPHRINE 4 MG/250ML-% IV SOLN
0.0000 ug/min | INTRAVENOUS | Status: DC
  Administered 2019-12-10: 2 ug/min via INTRAVENOUS
  Administered 2019-12-10: 4 ug/min via INTRAVENOUS
  Filled 2019-12-10: qty 250

## 2019-12-10 MED ORDER — ORAL CARE MOUTH RINSE
15.0000 mL | OROMUCOSAL | Status: DC
  Administered 2019-12-10 – 2019-12-30 (×201): 15 mL via OROMUCOSAL

## 2019-12-10 MED ORDER — PANTOPRAZOLE SODIUM 40 MG IV SOLR
40.0000 mg | INTRAVENOUS | Status: DC
  Administered 2019-12-10 – 2019-12-11 (×2): 40 mg via INTRAVENOUS
  Filled 2019-12-10 (×2): qty 40

## 2019-12-10 MED ORDER — DEXTROSE 50 % IV SOLN
1.0000 | Freq: Once | INTRAVENOUS | Status: AC
  Administered 2019-12-10: 50 mL via INTRAVENOUS
  Filled 2019-12-10: qty 50

## 2019-12-10 MED ORDER — PHENYLEPHRINE HCL-NACL 10-0.9 MG/250ML-% IV SOLN
0.0000 ug/min | INTRAVENOUS | Status: DC
  Administered 2019-12-10: 180 ug/min via INTRAVENOUS

## 2019-12-10 MED ORDER — SODIUM POLYSTYRENE SULFONATE 15 GM/60ML PO SUSP
30.0000 g | Freq: Once | ORAL | Status: AC
  Administered 2019-12-10: 30 g via RECTAL
  Filled 2019-12-10: qty 120

## 2019-12-10 MED ORDER — HEPARIN SODIUM (PORCINE) 5000 UNIT/ML IJ SOLN
5000.0000 [IU] | Freq: Three times a day (TID) | INTRAMUSCULAR | Status: DC
  Administered 2019-12-11 – 2019-12-13 (×4): 5000 [IU] via SUBCUTANEOUS
  Filled 2019-12-10 (×2): qty 1

## 2019-12-10 MED ORDER — DOCUSATE SODIUM 50 MG/5ML PO LIQD
100.0000 mg | Freq: Two times a day (BID) | ORAL | Status: DC
  Administered 2019-12-10 – 2019-12-13 (×5): 100 mg via ORAL
  Filled 2019-12-10 (×5): qty 10

## 2019-12-10 MED ORDER — MIDAZOLAM 50MG/50ML (1MG/ML) PREMIX INFUSION
0.0000 mg/h | INTRAVENOUS | Status: DC
  Administered 2019-12-10 (×2): 7 mg/h via INTRAVENOUS
  Administered 2019-12-10: 2 mg/h via INTRAVENOUS
  Administered 2019-12-11 – 2019-12-12 (×8): 9 mg/h via INTRAVENOUS
  Administered 2019-12-13: 7 mg/h via INTRAVENOUS
  Administered 2019-12-13: 9 mg/h via INTRAVENOUS
  Administered 2019-12-13: 7 mg/h via INTRAVENOUS
  Administered 2019-12-13 (×2): 9 mg/h via INTRAVENOUS
  Administered 2019-12-14: 6 mg/h via INTRAVENOUS
  Administered 2019-12-14: 2 mg/h via INTRAVENOUS
  Filled 2019-12-10 (×6): qty 50
  Filled 2019-12-10: qty 100
  Filled 2019-12-10 (×9): qty 50

## 2019-12-10 MED ORDER — CALCIUM GLUCONATE-NACL 1-0.675 GM/50ML-% IV SOLN
1.0000 g | Freq: Once | INTRAVENOUS | Status: AC
  Administered 2019-12-10: 1000 mg via INTRAVENOUS
  Filled 2019-12-10: qty 50

## 2019-12-10 MED ORDER — PHENYLEPHRINE HCL-NACL 10-0.9 MG/250ML-% IV SOLN
0.0000 ug/min | INTRAVENOUS | Status: DC
  Administered 2019-12-10: 200 ug/min via INTRAVENOUS
  Filled 2019-12-10: qty 250

## 2019-12-10 MED ORDER — PROSOURCE TF PO LIQD
45.0000 mL | Freq: Two times a day (BID) | ORAL | Status: DC
  Administered 2019-12-10 – 2019-12-16 (×12): 45 mL
  Filled 2019-12-10 (×10): qty 45

## 2019-12-10 MED ORDER — VECURONIUM BROMIDE 10 MG IV SOLR
INTRAVENOUS | Status: AC
  Filled 2019-12-10: qty 10

## 2019-12-10 MED ORDER — POLYETHYLENE GLYCOL 3350 17 G PO PACK
17.0000 g | PACK | Freq: Every day | ORAL | Status: DC
  Administered 2019-12-10 – 2019-12-12 (×2): 17 g via ORAL
  Filled 2019-12-10 (×2): qty 1

## 2019-12-10 MED ORDER — CHLORHEXIDINE GLUCONATE 0.12% ORAL RINSE (MEDLINE KIT)
15.0000 mL | Freq: Two times a day (BID) | OROMUCOSAL | Status: DC
  Administered 2019-12-10 – 2019-12-30 (×39): 15 mL via OROMUCOSAL

## 2019-12-10 NOTE — Progress Notes (Addendum)
Assisted with proning PT while on ventilator- uneventful. ETT taped securely at 25 cm lip.

## 2019-12-10 NOTE — TOC Progression Note (Signed)
Transition of Care [facility]) - Progression Note    Patient Details  Name: Ronald Olsen MRN: [id number] Date of Birth: 02/07/1951  Transition of Care [facility]) CM/SW Contact  Leeroy Cha, RN Phone Number: 12/10/2019, 8:41 AM  Clinical Narrative:    Vent at 100%Fi02,intubated on [id number] at 1640,Iv soplu medrol, iv maxipime, iv sedation, iv nahco3 drip started am of [id number]/ wbc 44.8, d.dimer 2.46, K+=6.4, bun 68,creat 2.16, covid patient /unvaccinated.        Expected Discharge Plan and Services                                                 Social Determinants of Health (SDOH) Interventions    Readmission Risk Interventions No flowsheet data found.

## 2019-12-10 NOTE — Progress Notes (Signed)
[place] Progress Note Patient Name: Ronald Olsen DOB: 09/25/1950 MRN: [id number]   Date of Service  12/10/2019  HPI/Events of Note  Nosebleed - INR = 1.4.   eICU Interventions  Transfuse 2 units FFP now.      Intervention Category Major Interventions: Other:  Analysse Quinonez Cornelia Copa 12/10/2019, 6:54 AM

## 2019-12-10 NOTE — Progress Notes (Signed)
CRITICAL VALUE ALERT  Critical Value: pH 7.153, PCO2 68.5  Date & Time Notied: 10/5 @ 2258  Provider Notified: elink notified  Orders Received/Actions taken: see note from dr sommer/mar

## 2019-12-10 NOTE — Progress Notes (Signed)
OT Cancellation Note  Patient Details Name: Ronald Olsen MRN: [id number] DOB: 05/10/1950   Cancelled Treatment:    Reason Eval/Treat Not Completed: Medical issues which prohibited therapy. Patient intubated 10/5, started on pressors 10/5, with elevated K and multiple medical issues. Will check back later this week on patient's medical status.  Delbert Phenix OT OT pager: [phone]   Rosemary Holms 12/10/2019, 6:48 AM

## 2019-12-10 NOTE — Progress Notes (Signed)
Mr. Ronald Olsen currently intubated and on PCCM service.  Will sign off.  Please let us know when we can be of assistance.  TRH.

## 2019-12-10 NOTE — Progress Notes (Addendum)
Nutrition Follow-up  INTERVENTION:   -Vital AF 1.2 @ 60 ml/hr  -45 ml Prosource TF BID, each provides 40 kcals and 11g protein -Provides 1808 kcals, 130g protein and 1167 ml H2O.  -Free water per MD, currently 200 ml every 6 hours  NUTRITION DIAGNOSIS:   Increased nutrient needs related to acute illness (COVID-19 infection) as evidenced by estimated needs.  Ongoing.  GOAL:   Patient will meet greater than or equal to 90% of their needs  Not meeting currently.  MONITOR:   Labs, Weight trends, TF tolerance, Vent status, I & O's  REASON FOR ASSESSMENT:   Consult, Ventilator Enteral/tube feeding initiation and management  ASSESSMENT:   69 year old male with a past medical history for hypertension, dyslipidemia who presents with dyspnea, malaise, fatigue and cough for about 10 days.  Acute worsening of his symptoms over the last 72 hours prior to hospitalization.  Multiple sick contacts at home, he has not been vaccinated.Patient admitted to the hospital with working diagnosis of acute hypoxic respiratory failure due to SARS COVID-19 viral pneumonia.  9/20: admitted for COVID-19 PNA  Last follow-up from RD 10/5. Pt was intubated later that day. Pt was consuming 25-75% of meals prior to intubation and was drinking supplements.    Pronation ordered for 4 hours at a time for 16 hours total.  Patient is currently intubated on ventilator support MV: 15.2 L/min Temp (24hrs), Avg:98.1 F (36.7 C), Min:97.7 F (36.5 C), Max:98.7 F (37.1 C)  Propofol: 15.84 ml/hr -> provides 418 fat kcals  Medications: D50 infusion Labs reviewed: CBGs: 110 Elevated K  Diet Order:   Diet Order            Diet NPO time specified  Diet effective now                 EDUCATION NEEDS:   No education needs have been identified at this time  Skin:  Skin Assessment: Reviewed RN Assessment  Last BM:  10/3  Height:   Ht Readings from Last 1 Encounters:  12/09/19 5\' 11"  (1.803 m)     Weight:   Wt Readings from Last 1 Encounters:  12/09/19 88 kg    BMI:  Body mass index is 27.06 kg/m.  Estimated Nutritional Needs:   Kcal:  2300-2500  Protein:  120-135g  Fluid:  2L/day  Clayton Bibles, MS, RD, LDN Inpatient Clinical Dietitian Contact information available via Amion

## 2019-12-10 NOTE — Progress Notes (Signed)
NAME:  Ronald Olsen, MRN:  [id number], DOB:  17-May-1950, LOS: 18 ADMISSION DATE:  11/27/2019, CONSULTATION DATE:  10/4 REFERRING MD:  Doreatha Martin, CHIEF COMPLAINT:  Respiratory failure in setting of COVID-19   Brief History   69 year old male patient admitted on 9/20 with Covid pneumonia. Treated with supplemental oxygen, systemic steroids, remdesivir and baricitinib. Progressive resp failure, intubated 10/5. Evolving ARF. Treating empirically for possible HCAP   Past Medical History  Hypertension, dyslipidemia. Coronary artery disease,  Significant Hospital Events   9/20:  CXR w/ bilateral infiltrates. + COVID-19, CRP 6.3, ddimer 1.67, PCT <0.10, BNP was neg.  He was started on high dose steroids, Remdesivirm and baricitinib.  9/21: 15 liters high flow.  9/22 CRP down 3.4 but still on high FIO2 9/25 completed day 5 of Remdesivir. Still on high dose steroids and Baricitinib  9/26 increased oxygen demand. Placed on NRB and high flow.  9/28 rapid desaturation w/ any movement  9/29 CT angio: negative for PE  9/30: no better  10/4 worsening rapid response called. PCCM asked to see w/ tranfers to ICU 10/5 intubated. Replaced w larger 8.0 ETT 10/5 for intermittent cuffleak. Epistaxis   Consults:  Pulmonary consulted 10/4  Procedures:  ETT 10/5 >>  PICC 10/5 >>   Significant Diagnostic Tests:  CT chest 9/29 showed multifocal groundglass attenuations bilaterally through all lobes Echocardiogram 10/5 >> Intact LV function 82-42%, grade 1 diastolic dysfunction, normal RV function and size  Micro Data:  9/29:SARS/coronavirus 2: Positive. 9/20 blood culture x2: Negative.  Antimicrobials:  Remdesivir 9/20 through 9/25 vanc 10/3>> Ctx 10/3>>10/4 Cefepime 10/4>> azith 10/3>>  Interim history/subjective:   Intubated 10/5.  FiO2 1.00, PEEP 10.  His ET tube was changed to a larger 8.0 2300 due to intermittent cuff leak Intermittent paralytics given overnight Bicarbonate started  overnight, note hyperkalemia, treated medically early 10/6 Epistaxis and blood in his oropharynx, posterior pharynx noted.  INR 1.4, FFP was given enoxaparin, Plavix, ASA held    Objective   Blood pressure (!) 99/56, pulse 85, temperature 98.6 F (37 C), temperature source Axillary, resp. rate (!) 23, height 5\' 11"  (1.803 m), weight 88 kg, SpO2 98 %.    Vent Mode: PRVC FiO2 (%):  [90 %-100 %] 90 % Set Rate:  [10 bmp-35 bmp] 35 bmp Vt Set:  [450 mL] 450 mL PEEP:  [8 cmH20-12 cmH20] 10 cmH20 Plateau Pressure:  [21 cmH20-26 cmH20] 23 cmH20   Intake/Output Summary (Last 24 hours) at 12/10/2019 0836 Last data filed at 12/10/2019 3536 Gross per 24 hour  Intake 1812.57 ml  Output 1050 ml  Net 762.57 ml   Filed Weights   11/07/2019 1418 12/08/19 1200 12/09/19 0600  Weight: 99.8 kg 89.4 kg 88 kg    Examination: General: Critically ill-appearing man, sedated, ventilated HENT: ET tube in place, no cuff leak noted Lungs: Decreased, few mild inspiratory crackles, no wheezing.  Normal respiratory pattern without accessory muscle use Cardiovascular: Tachycardic, no murmur Abdomen: Nondistended, positive bowel sounds Extremities: No edema Neuro:   Resolved Hospital Problem list     Assessment & Plan:  Acute hypoxic respiratory failure in the setting of Covid pneumonia and ARDS -Completed baricitinib and remdesivir regimens -ET tube 10/5 with intermittent cuff leak, changed to a 8.0 tube later 10/5 Plan Mechanical ventilation via ARDS protocol, target PRVC 6 cc/kg Wean PEEP and FiO2 as able, plan to add PEEP 10/6 Goal plateau pressure less than 30, driving pressure less than 15 Paralytics if necessary for vent synchrony,  gas exchange.  Likely initiate continuous today Initiate prone positioning protocol 10/6 Deep sedation per PAD protocol, goal RASS -4, currently fentanyl, propofol infusions.  Consider change propofol to Versed Diuresis as blood pressure and renal function can  tolerate, goal CVP 5-8.  Deferred today given evolving acute renal failure, I/O -9.4 L total Bicarbonate infusion initiated post intubation for combined acidosis, attempt to wean 10/6 goal to off VAP prevention order set Empiric vancomycin, cefepime, azithromycin for possible superimposed bacterial pneumonia Goal restart enoxaparin 10/6 as below  Shock, In the setting of sedating medications.  Consider sepsis.  Echocardiogram 10/5 reassuring Transition phenylephrine to norepinephrine Transduce CVP Place arterial line Consider addition of vasopressin depending on blood pressure trend Careful with PEEP Empiric antibiotics as above  Mixed acidosis Bicarbonate infusion started 10/5, goal wean off as we improve ventilation  Acute renal failure, nonoliguric Follow BMP, urine output Renal dose medications and restore adequate perfusion  Epistaxis, suspect traumatic from NG tube attempt.  INR 1.4 Enoxaparin, aspirin, Plavix held.  Would continue to hold antiplatelets, try to add enoxaparin back on 10/6  Severe leukocytosis, suspect steroid and stress Plan Empiric antibiotics as ordered Follow CBC and culture data With significant rise in WBC could consider C. difficile but no diarrhea reported.  Mild hyperglycemia. Suspect steroid-induced Plan Sliding-scale insulin, glucose goal less than 180   Best practice:  Diet: Diet as tolerated Pain/Anxiety/Delirium protocol (if indicated): Propofol, fentanyl VAP protocol (if indicated): Ordered DVT prophylaxis: Enoxaparin held 10/5, goal restart 10/6 GI prophylaxis: PPI Glucose control sliding scale insulin Mobility: Bedrest Code Status: DNR Family Communication: Discussed w wife by phone 10/6 Disposition:  ICU  Labs   CBC: Recent Labs  Lab 12/06/19 0543 12/06/19 0543 12/07/19 0524 12/08/19 0500 12/09/19 0327 12/09/19 2150 12/10/19 0352  WBC 30.0*  --  35.6* 32.1* 29.3*  --  44.8*  NEUTROABS 27.1*  --  32.2* 29.2* 27.7*  --   40.9*  HGB 13.9   < > 14.5 14.1 13.9 13.2 12.4*  HCT 41.8   < > 44.1 41.2 42.0 42.3 37.8*  MCV 92.9  --  93.4 91.6 93.5  --  94.0  PLT 293  --  301 261 226  --  258   < > = values in this interval not displayed.    Basic Metabolic Panel: Recent Labs  Lab 12/06/19 0543 12/07/19 0524 12/08/19 0500 12/09/19 0327 12/10/19 0352  NA 132* 134* 131* 135 140  K 5.3* 5.0 5.0 5.4* 6.4*  CL 98 96* 97* 100 104  CO2 25 26 25 23 27   GLUCOSE 145* 126* 113* 140* 126*  BUN 42* 57* 53* 49* 66*  CREATININE 0.98 1.06 1.10 1.07 2.16*  CALCIUM 8.9 9.2 8.7* 8.6* 7.9*  MG  --   --  2.4  --   --   PHOS  --   --  3.7  --   --    GFR: Estimated Creatinine Clearance: 35.3 mL/min (A) (by C-G formula based on SCr of 2.16 mg/dL (H)). Recent Labs  Lab 12/05/19 0335 12/05/19 0335 12/06/19 0543 12/06/19 0543 12/07/19 0524 12/08/19 0500 12/09/19 0327 12/10/19 0352  PROCALCITON <0.10  --  <0.10  --   --   --   --   --   WBC 39.1*   < > 30.0*   < > 35.6* 32.1* 29.3* 44.8*   < > = values in this interval not displayed.    Liver Function Tests: Recent Labs  Lab 12/06/19 0543 12/07/19  1587 12/08/19 0500 12/09/19 0327 12/10/19 0352  AST 26 32 37 38 33  ALT 62* 80* 82* 80* 76*  ALKPHOS 66 70 72 80 78  BILITOT 1.0 0.8 0.9 1.0 0.9  PROT 6.5 6.8 6.4* 6.7 5.9*  ALBUMIN 2.8* 2.8* 2.4* 2.5* 2.2*   No results for input(s): LIPASE, AMYLASE in the last 168 hours. No results for input(s): AMMONIA in the last 168 hours.  ABG    Component Value Date/Time   PHART 7.268 (L) 12/10/2019 0500   PCO2ART 62.4 (H) 12/10/2019 0500   PO2ART 83.0 12/10/2019 0500   HCO3 27.8 12/10/2019 0500   ACIDBASEDEF 0.2 12/10/2019 0500   O2SAT 93.8 12/10/2019 0500     Coagulation Profile: Recent Labs  Lab 12/10/19 0352  INR 1.4*    Cardiac Enzymes: No results for input(s): CKTOTAL, CKMB, CKMBINDEX, TROPONINI in the last 168 hours.  HbA1C: Hgb A1c MFr Bld  Date/Time Value Ref Range Status  11/25/2019 05:59 AM  5.9 (H) 4.8 - 5.6 % Final    Comment:    (NOTE) Pre diabetes:          5.7%-6.4%  Diabetes:              >6.4%  Glycemic control for   <7.0% adults with diabetes     CBG: Recent Labs  Lab 12/09/19 1603 12/09/19 1936 12/09/19 2349 12/10/19 0348 12/10/19 0810  GLUCAP 138* 205* 239* 113* 110*     Critical care time: 95 min    Baltazar Apo, MD, PhD 12/10/2019, 8:36 AM [facility] [phone] or if no answer [phone]

## 2019-12-10 NOTE — Progress Notes (Signed)
Two RT's attempted ordered arterial line- unsuccessful. RN aware and agrees to contact CCM for advisement.

## 2019-12-10 NOTE — Progress Notes (Signed)
PT Cancellation Note / Sign off  Patient Details Name: Ronald Olsen MRN: [id number] DOB: 11/27/1950   Cancelled Treatment:    Reason Eval/Treat Not Completed: Medical issues which prohibited therapy Pt's 3rd medical cancel as pt is now requiring ventilator support. PT to sign off. Please reorder when pt medically ready to participate.   Annai Heick,KATHrine E 12/10/2019, 9:22 AM Arlyce Dice, DPT Acute [facility] Services Pager: [phone] Office: [phone]

## 2019-12-10 NOTE — Progress Notes (Addendum)
Ronald Olsen for Heparin  Indication: possible DVT/PE  Allergies  Allergen Reactions  . Amoxicillin Other (See Comments)    "Cloudy headed" with long doses    Patient Measurements: Height: 5\' 11"  (180.3 cm) Weight: 88 kg (194 lb 0.1 oz) IBW/kg (Calculated) : 75.3 Heparin Dosing Weight: 88 kg  Vital Signs: Temp: 98.6 F (37 C) (10/06 0759) Temp Source: Axillary (10/06 0759) BP: 92/57 (10/06 0800) Pulse Rate: 79 (10/06 0800)  Labs: Recent Labs    12/08/19 0500 12/08/19 0500 12/09/19 0327 12/09/19 0327 12/09/19 2150 12/10/19 0352 12/10/19 0843  HGB 14.1   < > 13.9   < > 13.2 12.4*  --   HCT 41.2   < > 42.0  --  42.3 37.8*  --   PLT 261  --  226  --   --  258  --   APTT  --   --   --   --   --  34  --   LABPROT  --   --   --   --   --  16.6*  --   INR  --   --   --   --   --  1.4*  --   CREATININE 1.10   < > 1.07  --   --  2.16* 2.47*   < > = values in this interval not displayed.    Estimated Creatinine Clearance: 30.9 mL/min (A) (by C-G formula based on SCr of 2.47 mg/dL (H)).   Medical History: Past Medical History:  Diagnosis Date  . Elevated PSA   . Family history of prostate problems   . Frequent headaches   . GERD (gastroesophageal reflux disease)   . Hyperlipidemia   . Hypertension   . Migraines     Medications:  Last dose of Lovenox 10/6 AM at 0930   Assessment: 69 y/o M admitted with COVID PNA. Patient was placed on full-dose anticoagulation due to elevated-dimer and inability to go to CT scan. Dopplers ordered but not performed yet. Patient with SCr more than doubled, oliguric. Plan to continue full-dose anticoagulation for now but will transition to heparin drip due to AKI.   Goal of Therapy:  Heparin level 0.3-0.7 units/ml Monitor platelets by anticoagulation protocol: Yes   Plan:  Start heparin infusion at 1300 units/hr tomorrow at 0930 (24 h from last therapeutic Lovenox dose) Check anti-Xa level in 8  hours and daily while on heparin Baseline anti-Xa, aPTT, and PT/INR in AM  CBC Daily  Ulice Dash D 12/10/2019,11:40 AM  Addendum:   After speaking with RN, Lovenox was actually not administered this AM. Therefore the last dose of Lovenox was actually 1953 last night. Urine is pink-tinged. Also spoke to Dr. Lamonte Sakai who agrees that plan is to continue heparin for now until/unless prohibited by worsening bleeding. Will initiate heparin drip without bolus at 1300 units/hr this PM at 2000. Discontinued order for baseline coags since infusion will start this evening.   Ulice Dash, PharmD, BCPS [phone]

## 2019-12-10 NOTE — Progress Notes (Addendum)
[facility] Progress Note Patient Name: Ronald Olsen DOB: 1950-04-29 MRN: [id number]   Date of Service  12/10/2019  HPI/Events of Note  Mulitple issues: 1. Oliguria - urine output for this shift = 125 mL and 2. Extensive nose bleed with clots from nose and mouth. Hgb = 13.2. Platelets = 226.   eICU Interventions  Plan: 1. Hold Lovenox, Plavix and ASA until patient re-evaluated by rounding team in AM. 2. PT/INR and PTT STAT.     Intervention Category Major Interventions: Other:  Lysle Dingwall 12/10/2019, 4:17 AM

## 2019-12-10 NOTE — Progress Notes (Signed)
[facility] Progress Note Patient Name: Ronald Olsen DOB: June 01, 1950 MRN: [id number]   Date of Service  12/10/2019  HPI/Events of Note  Nursing request to change Phenylephrine from PIV to CVL dosing.   eICU Interventions  Plan. 1. D/C Norepinephrine IV infusion via PIV. 2. Norepinephrine IV infusion. Titrate to MAP >= 65.      Intervention Category Major Interventions: Hypotension - evaluation and management  Ronald Olsen 12/10/2019, 12:13 AM

## 2019-12-10 NOTE — Procedures (Signed)
Arterial Catheter Insertion Procedure Note  Ronald Olsen  [id number]  [birth year]  Date:12/10/19  Time:12:59 PM    Provider Performing: Clementeen Graham    Procedure: Insertion of Arterial Line [phone]) with US guidance ([id number])   Indication(s) Blood pressure monitoring and/or need for frequent ABGs  Consent Risks of the procedure as well as the alternatives and risks of each were explained to the patient and/or caregiver.  Consent for the procedure was obtained and is signed in the bedside chart  Anesthesia None   Time Out Verified patient identification, verified procedure, site/side was marked, verified correct patient position, special equipment/implants available, medications/allergies/relevant history reviewed, required imaging and test results available.   Sterile Technique Maximal sterile technique including full sterile barrier drape, hand hygiene, sterile gown, sterile gloves, mask, hair covering, sterile ultrasound probe cover (if used).   Procedure Description Area of catheter insertion was cleaned with chlorhexidine and draped in sterile fashion. With real-time ultrasound guidance an arterial catheter was placed into the right femoral artery.  Appropriate arterial tracings confirmed on monitor.     Complications/Tolerance None; patient tolerated the procedure well.   EBL Minimal   Specimen(s) None Erick Colace ACNP-BC [place] Pager # [phone] OR # [phone] if no answer

## 2019-12-10 NOTE — Progress Notes (Signed)
Notified Lab that ABG being sent for analysis. 

## 2019-12-10 NOTE — Progress Notes (Signed)
[facility] Progress Note Patient Name: Ronald Olsen DOB: 23-Dec-1950 MRN: [id number]   Date of Service  12/10/2019  HPI/Events of Note  Hyperkalemia - K+ = 6.4.   eICU Interventions  Plan: 1. Calcium gluconate 1 gm IV now. 2. NaHCO3 100 meq IV now.  3. D50 1 amp IV now.  3. Novolog insulin 10 units IV now.  4. Kayexalate enema 30 gm per rectum now. 5. Repeat BMP at 12 noon.      Intervention Category Major Interventions: Electrolyte abnormality - evaluation and management  Malayja Freund [place] 12/10/2019, 4:53 AM

## 2019-12-10 NOTE — Progress Notes (Signed)
Pharmacy Antibiotic Note  Ronald Olsen is a 69 y.o. male admitted on 12/03/2019 with Covid pneumonia.  Pharmacy has been consulted for cefepime dosing for PNA.  12/10/2019  ABX D#4 Vanc/CTX/azith to transition to Cefepime WBC 44.8 (on solumedrol) SCr 1.1 >> 2.47 Tm 98.7 10/3 CRP 9.5 & 9.1 10/2 PCT neg Completed 14 days barictinib for Covid on 10/3  Plan: Cefepime 2 gm IV q8h >> 2 g iv q 12h F/u renal fxn, WBC, temp. Culture data   Height: 5\' 11"  (180.3 cm) Weight: 88 kg (194 lb 0.1 oz) IBW/kg (Calculated) : 75.3  Temp (24hrs), Avg:98.1 F (36.7 C), Min:97.7 F (36.5 C), Max:98.7 F (37.1 C)  Recent Labs  Lab 12/06/19 0543 12/06/19 0543 12/07/19 0524 12/08/19 0500 12/09/19 0327 12/10/19 0352 12/10/19 0843  WBC 30.0*  --  35.6* 32.1* 29.3* 44.8*  --   CREATININE 0.98   < > 1.06 1.10 1.07 2.16* 2.47*   < > = values in this interval not displayed.    Estimated Creatinine Clearance: 30.9 mL/min (A) (by C-G formula based on SCr of 2.47 mg/dL (H)).    Allergies  Allergen Reactions  . Amoxicillin Other (See Comments)    "Cloudy headed" with long doses    Antimicrobials this admission:  9/20 Remdesivir  > 9/24 9/20 Baricitinib >10/3 10/3 vanc>>10/4 10/4 cefepime>> 10/3 CTX> 10/4 10/3 azith>> 10/5 Dose adjustments this admission:   Microbiology results:  9/20 BCx x2: NGF 9/20 COVID+ 10/3 MRSA PCR neg 10/4 MRSA PCR neg 10/3 sputum ordered, not yet collected  Thank you for allowing pharmacy to be a part of this patient's care.  Ulice Dash, PharmD, BCPS [phone] 12/10/2019 9:55 AM

## 2019-12-11 ENCOUNTER — Inpatient Hospital Stay (HOSPITAL_COMMUNITY): Payer: Medicare Other

## 2019-12-11 DIAGNOSIS — U071 COVID-19: Secondary | ICD-10-CM

## 2019-12-11 DIAGNOSIS — R7989 Other specified abnormal findings of blood chemistry: Secondary | ICD-10-CM | POA: Diagnosis not present

## 2019-12-11 DIAGNOSIS — J80 Acute respiratory distress syndrome: Secondary | ICD-10-CM | POA: Diagnosis not present

## 2019-12-11 LAB — HEPATIC FUNCTION PANEL
ALT: 90 U/L — ABNORMAL HIGH (ref 0–44)
AST: 35 U/L (ref 15–41)
Albumin: 2 g/dL — ABNORMAL LOW (ref 3.5–5.0)
Alkaline Phosphatase: 64 U/L (ref 38–126)
Bilirubin, Direct: 0.2 mg/dL (ref 0.0–0.2)
Indirect Bilirubin: 0.6 mg/dL (ref 0.3–0.9)
Total Bilirubin: 0.8 mg/dL (ref 0.3–1.2)
Total Protein: 5.7 g/dL — ABNORMAL LOW (ref 6.5–8.1)

## 2019-12-11 LAB — BLOOD GAS, ARTERIAL
Acid-Base Excess: 3.8 mmol/L — ABNORMAL HIGH (ref 0.0–2.0)
Bicarbonate: 30 mmol/L — ABNORMAL HIGH (ref 20.0–28.0)
Drawn by: 25770
FIO2: 60
MECHVT: 450 mL
O2 Saturation: 95.5 %
PEEP: 10 cmH2O
Patient temperature: 98.6
RATE: 35 resp/min
pCO2 arterial: 56.2 mmHg — ABNORMAL HIGH (ref 32.0–48.0)
pH, Arterial: 7.347 — ABNORMAL LOW (ref 7.350–7.450)
pO2, Arterial: 90.6 mmHg (ref 83.0–108.0)

## 2019-12-11 LAB — CBC WITH DIFFERENTIAL/PLATELET
Abs Immature Granulocytes: 0.23 10*3/uL — ABNORMAL HIGH (ref 0.00–0.07)
Basophils Absolute: 0 10*3/uL (ref 0.0–0.1)
Basophils Relative: 0 %
Eosinophils Absolute: 0.2 10*3/uL (ref 0.0–0.5)
Eosinophils Relative: 1 %
HCT: 36.7 % — ABNORMAL LOW (ref 39.0–52.0)
Hemoglobin: 11.3 g/dL — ABNORMAL LOW (ref 13.0–17.0)
Immature Granulocytes: 1 %
Lymphocytes Relative: 4 %
Lymphs Abs: 0.9 10*3/uL (ref 0.7–4.0)
MCH: 30.7 pg (ref 26.0–34.0)
MCHC: 30.8 g/dL (ref 30.0–36.0)
MCV: 99.7 fL (ref 80.0–100.0)
Monocytes Absolute: 1.5 10*3/uL — ABNORMAL HIGH (ref 0.1–1.0)
Monocytes Relative: 6 %
Neutro Abs: 22.8 10*3/uL — ABNORMAL HIGH (ref 1.7–7.7)
Neutrophils Relative %: 88 %
Platelets: 176 10*3/uL (ref 150–400)
RBC: 3.68 MIL/uL — ABNORMAL LOW (ref 4.22–5.81)
RDW: 13.1 % (ref 11.5–15.5)
WBC: 25.7 10*3/uL — ABNORMAL HIGH (ref 4.0–10.5)
nRBC: 0 % (ref 0.0–0.2)

## 2019-12-11 LAB — GLUCOSE, CAPILLARY
Glucose-Capillary: 107 mg/dL — ABNORMAL HIGH (ref 70–99)
Glucose-Capillary: 79 mg/dL (ref 70–99)
Glucose-Capillary: 73 mg/dL (ref 70–99)
Glucose-Capillary: 62 mg/dL — ABNORMAL LOW (ref 70–99)
Glucose-Capillary: 142 mg/dL — ABNORMAL HIGH (ref 70–99)
Glucose-Capillary: 127 mg/dL — ABNORMAL HIGH (ref 70–99)
Glucose-Capillary: 106 mg/dL — ABNORMAL HIGH (ref 70–99)

## 2019-12-11 LAB — MAGNESIUM: Magnesium: 3.1 mg/dL — ABNORMAL HIGH (ref 1.7–2.4)

## 2019-12-11 LAB — BASIC METABOLIC PANEL
Anion gap: 10 (ref 5–15)
BUN: 88 mg/dL — ABNORMAL HIGH (ref 8–23)
CO2: 30 mmol/L (ref 22–32)
Calcium: 7.9 mg/dL — ABNORMAL LOW (ref 8.9–10.3)
Chloride: 105 mmol/L (ref 98–111)
Creatinine, Ser: 2.64 mg/dL — ABNORMAL HIGH (ref 0.61–1.24)
GFR calc non Af Amer: 24 mL/min — ABNORMAL LOW (ref >60)
Glucose, Bld: 113 mg/dL — ABNORMAL HIGH (ref 70–99)
Potassium: 5.1 mmol/L (ref 3.5–5.1)
Sodium: 145 mmol/L (ref 135–145)

## 2019-12-11 LAB — D-DIMER, QUANTITATIVE: D-Dimer, Quant: 2.1 ug/mL-FEU — ABNORMAL HIGH (ref 0.00–0.50)

## 2019-12-11 LAB — LACTIC ACID, PLASMA: Lactic Acid, Venous: 1 mmol/L (ref 0.5–1.9)

## 2019-12-11 LAB — PHOSPHORUS: Phosphorus: 5.9 mg/dL — ABNORMAL HIGH (ref 2.5–4.6)

## 2019-12-11 LAB — TRIGLYCERIDES: Triglycerides: 211 mg/dL — ABNORMAL HIGH (ref <150)

## 2019-12-11 MED ORDER — VECURONIUM BROMIDE 10 MG IV SOLR
INTRAVENOUS | Status: AC
  Filled 2019-12-11: qty 10

## 2019-12-11 MED ORDER — DEXTROSE 50 % IV SOLN
INTRAVENOUS | Status: AC
  Administered 2019-12-11: 25 mL
  Filled 2019-12-11: qty 50

## 2019-12-11 MED ORDER — VECURONIUM BROMIDE 10 MG IV SOLR
10.0000 mg | INTRAVENOUS | Status: DC | PRN
  Administered 2019-12-12 – 2019-12-13 (×9): 10 mg via INTRAVENOUS
  Filled 2019-12-11 (×9): qty 10

## 2019-12-11 NOTE — TOC Progression Note (Signed)
Transition of Care [facility]) - Progression Note    Patient Details  Name: Ronald Olsen MRN: [id number] Date of Birth: 1951/01/15  Transition of Care [facility]) CM/SW Contact  Leeroy Cha, RN Phone Number: 12/11/2019, 8:09 AM  Clinical Narrative:    Continues to require vent support at 60%,A.lines in place bp max 150/64 on iv levophed, iv sedation,Iv maxipime, bun=88 creat=2.64, wbc 25.7 d.dimer 2.10 Following for progression covid + patient  Plan-TBD based on progression patient is from home        Expected Discharge Plan and Services                                                 Social Determinants of Health (SDOH) Interventions    Readmission Risk Interventions No flowsheet data found.

## 2019-12-11 NOTE — Progress Notes (Signed)
Notified Lab that ABG being sent for analysis. 

## 2019-12-11 NOTE — Progress Notes (Signed)
VASCULAR LAB    Bilateral lower extremity venous duplex has been performed.  See CV proc for preliminary results.   Omarri Eich, RVT 12/11/2019, 12:17 PM

## 2019-12-11 NOTE — Progress Notes (Addendum)
NAME:  Ronald Olsen, MRN:  [id number], DOB:  1950/09/06, LOS: 65 ADMISSION DATE:  11/13/2019, CONSULTATION DATE:  10/4 REFERRING MD:  Doreatha Martin, CHIEF COMPLAINT:  Respiratory failure in setting of COVID-19   Brief History   69 year old male patient admitted on 9/20 with Covid pneumonia. Treated with supplemental oxygen, systemic steroids, remdesivir and baricitinib. Progressive resp failure, intubated 10/5. Evolving ARF. Treating empirically for possible HCAP   Past Medical History  Hypertension, dyslipidemia. Coronary artery disease,  Significant Hospital Events   9/20:  CXR w/ bilateral infiltrates. + COVID-19, CRP 6.3, ddimer 1.67, PCT <0.10, BNP was neg.  He was started on high dose steroids, Remdesivirm and baricitinib.  9/21: 15 liters high flow.  9/22 CRP down 3.4 but still on high FIO2 9/25 completed day 5 of Remdesivir. Still on high dose steroids and Baricitinib  9/26 increased oxygen demand. Placed on NRB and high flow.  9/28 rapid desaturation w/ any movement  9/29 CT angio: negative for PE  9/30: no better  10/4 worsening rapid response called. PCCM asked to see w/ tranfers to ICU 10/5 intubated. Replaced w larger 8.0 ETT 10/5 for intermittent cuffleak. Epistaxis   Consults:  Pulmonary consulted 10/4  Procedures:  ETT 10/5 >>  PICC 10/5 >>   Significant Diagnostic Tests:  CT chest 9/29 showed multifocal groundglass attenuations bilaterally through all lobes Echocardiogram 10/5 >> Intact LV function 51-88%, grade 1 diastolic dysfunction, normal RV function and size  Micro Data:  9/29:SARS/coronavirus 2: Positive. 9/20 blood culture x2: Negative.  Antimicrobials:  Remdesivir 9/20 through 9/25 vanc 10/3 >> 10/6 Ctx 10/3>>10/4 Cefepime 10/4>> azith 10/3>> 10/6  Interim history/subjective:   Tolerated prone positioning overnight FiO2 0.60, PEEP 10 Continued oozing from nare, OP last 12 hours even after packing.  Heparin held   Objective   Blood  pressure 134/74, pulse 99, temperature 98.5 F (36.9 C), temperature source Oral, resp. rate (!) 33, height 5\' 11"  (1.803 m), weight 88 kg, SpO2 92 %. CVP:  [3 mmHg-11 mmHg] 3 mmHg  Vent Mode: PRVC FiO2 (%):  [60 %-70 %] 60 % Set Rate:  [35 bmp] 35 bmp Vt Set:  [450 mL] 450 mL PEEP:  [10 CZY60-63 cmH20] 10 cmH20 Plateau Pressure:  [18 cmH20-25 cmH20] 18 cmH20   Intake/Output Summary (Last 24 hours) at 12/11/2019 0932 Last data filed at 12/11/2019 0800 Gross per 24 hour  Intake 1322.78 ml  Output 950 ml  Net 372.78 ml   Filed Weights   11/10/2019 1418 12/08/19 1200 12/09/19 0600  Weight: 99.8 kg 89.4 kg 88 kg    Examination: General: critically ill man, sedated, ventilated.  Just converted to the supine position HENT: ET tube in place.  Dry blood in oropharynx and left nare with packing in the left nare.  No active bleeding seen. Lungs: Bilateral scattered inspiratory crackles, no wheezes Cardiovascular: Tachycardic, no murmur Abdomen: Nondistended, positive bowel sounds Extremities: No edema Skin: Some mild erythema from pressure on left upper chest Neuro: Sedated, does not wake to voice or pain.   Resolved Hospital Problem list     Assessment & Plan:  Acute hypoxic respiratory failure in the setting of Covid pneumonia and ARDS -Completed baricitinib and remdesivir regimens -ET tube 10/5 with intermittent cuff leak, changed to a 8.0 tube later 10/5 Plan Continue Mechanical ventilation via ARDS protocol, target PRVC 6 cc/kg Wean PEEP and FiO2 as able, current 0.60 + 10. Will wean FiO2 if able Goal plateau pressure less than 30, driving pressure less  than 15 Paralytics if necessary for vent synchrony, gas exchange.  Likely initiate continuous today Continue prone positioning protocol, plan back to supine position this am 10/7, then check ABG Deep sedation per PAD protocol, goal RASS -4, currently fentanyl, versed infusions Have been weaning systemic steroids, plan to off  today on 10/7 Diuresis as blood pressure and renal function can tolerate, goal CVP 5-8.  Deferred 10/6 and 10/7 given evolving acute renal failure, I/O -8.4 L total. Would try to restart low dose lasix 10/8 if able.  Bicarbonate infusion stopped 10/6 VAP prevention order set as ordered Empiric vancomycin, cefepime, azithromycin for possible superimposed bacterial pneumonia Therapeutic enoxaparin and subsequently heparin gtt held due to epistaxis. Goal restart heparin without bolus on 10/8  Shock, In the setting of sedating medications.  Consider sepsis.  Echocardiogram 10/5 reassuring Norepi weaned to off o/n last night 10/7 Follow CVP Empiric abx as above  Mixed acidosis Bicarb weaned to off 10/6  Acute renal failure, nonoliguric. Hopefully has plateaued Follow BMP, UOP Renal dose medications and restore adequate perfusion  Epistaxis, suspect traumatic from NG tube attempt.  INR 1.4 Enoxaparin, aspirin, Plavix held.  Attempted heparin gtt on 10/6 but held due to continued nasal blood. Seems to be stopping. May need to call ENT if persists. Would like to try restart heparin on 10/8 after removal nasal packing.   Severe leukocytosis, suspect steroid and stress Plan Continue Empiric antibiotics as ordered Follow CBC and cx data No diarrhea to suggest C diff  Mild hyperglycemia. Suspect steroid-induced Plan Insulin ss   Best practice:  Diet: Diet as tolerated Pain/Anxiety/Delirium protocol (if indicated): Propofol, fentanyl VAP protocol (if indicated): Ordered DVT prophylaxis: Enoxaparin held 10/5, goal restart 10/6 GI prophylaxis: PPI Glucose control sliding scale insulin Mobility: Bedrest Code Status: DNR Family Communication: Discussed w wife by phone 10/6 Disposition:  ICU  Labs   CBC: Recent Labs  Lab 12/07/19 0524 12/07/19 0524 12/08/19 0500 12/08/19 0500 12/09/19 0327 12/09/19 2150 12/10/19 0352 12/10/19 2154 12/11/19 0307  WBC 35.6*   < > 32.1*  --   29.3*  --  44.8* 29.0* 25.7*  NEUTROABS 32.2*  --  29.2*  --  27.7*  --  40.9*  --  22.8*  HGB 14.5   < > 14.1   < > 13.9 13.2 12.4* 12.0* 11.3*  HCT 44.1   < > 41.2   < > 42.0 42.3 37.8* 37.2* 36.7*  MCV 93.4   < > 91.6  --  93.5  --  94.0 96.9 99.7  PLT 301   < > 261  --  226  --  258 190 176   < > = values in this interval not displayed.    Basic Metabolic Panel: Recent Labs  Lab 12/08/19 0500 12/08/19 0500 12/09/19 0327 12/10/19 0352 12/10/19 0843 12/10/19 2154 12/11/19 0307  NA 131*   < > 135 140 140 144 145  K 5.0   < > 5.4* 6.4* 5.6* 5.2* 5.1  CL 97*   < > 100 104 101 104 105  CO2 25   < > 23 27 29 28 30   GLUCOSE 113*   < > 140* 126* 125* 114* 113*  BUN 53*   < > 49* 66* 71* 82* 88*  CREATININE 1.10   < > 1.07 2.16* 2.47* 2.71* 2.64*  CALCIUM 8.7*   < > 8.6* 7.9* 7.6* 7.6* 7.9*  MG 2.4  --   --   --   --   --  3.1*  PHOS 3.7  --   --   --   --   --  5.9*   < > = values in this interval not displayed.   GFR: Estimated Creatinine Clearance: 28.9 mL/min (A) (by C-G formula based on SCr of 2.64 mg/dL (H)). Recent Labs  Lab 12/05/19 0335 12/05/19 0335 12/06/19 0543 12/07/19 0524 12/09/19 0327 12/10/19 0352 12/10/19 2154 12/11/19 0307  PROCALCITON <0.10  --  <0.10  --   --   --   --   --   WBC 39.1*   < > 30.0*   < > 29.3* 44.8* 29.0* 25.7*  LATICACIDVEN  --   --   --   --   --   --   --  1.0   < > = values in this interval not displayed.    Liver Function Tests: Recent Labs  Lab 12/07/19 0524 12/08/19 0500 12/09/19 0327 12/10/19 0352 12/11/19 0307  AST 32 37 38 33 35  ALT 80* 82* 80* 76* 90*  ALKPHOS 70 72 80 78 64  BILITOT 0.8 0.9 1.0 0.9 0.8  PROT 6.8 6.4* 6.7 5.9* 5.7*  ALBUMIN 2.8* 2.4* 2.5* 2.2* 2.0*   No results for input(s): LIPASE, AMYLASE in the last 168 hours. No results for input(s): AMMONIA in the last 168 hours.  ABG    Component Value Date/Time   PHART 7.361 12/10/2019 1026   PCO2ART 54.8 (H) 12/10/2019 1026   PO2ART 78.5 (L)  12/10/2019 1026   HCO3 30.3 (H) 12/10/2019 1026   ACIDBASEDEF 0.2 12/10/2019 0500   O2SAT 93.9 12/10/2019 1026     Coagulation Profile: Recent Labs  Lab 12/10/19 0352  INR 1.4*    Cardiac Enzymes: No results for input(s): CKTOTAL, CKMB, CKMBINDEX, TROPONINI in the last 168 hours.  HbA1C: Hgb A1c MFr Bld  Date/Time Value Ref Range Status  11/25/2019 05:59 AM 5.9 (H) 4.8 - 5.6 % Final    Comment:    (NOTE) Pre diabetes:          5.7%-6.4%  Diabetes:              >6.4%  Glycemic control for   <7.0% adults with diabetes     CBG: Recent Labs  Lab 12/10/19 1613 12/10/19 2015 12/10/19 2346 12/11/19 0453 12/11/19 0817  GLUCAP 102* 100* 98 107* 79     Critical care time: 40 min    Baltazar Apo, MD, PhD 12/11/2019, 9:32 AM  Pulmonary and Critical Care [phone] or if no answer [phone]

## 2019-12-11 NOTE — Progress Notes (Signed)
[facility] Progress Note Patient Name: Mozell Hardacre DOB: 04/11/1950 MRN: [id number]   Date of Service  12/11/2019  HPI/Events of Note  Patient continues to ooze blood from nose and mouth. Urine blood tinged.   eICU Interventions  Plan: 1. Hold 6 AM Heparin [place] dose until patient re-evaluated by rounding team.     Intervention Category Major Interventions: Other:  Kahley Leib Cornelia Copa 12/11/2019, 3:54 AM

## 2019-12-11 NOTE — Progress Notes (Signed)
Assisted with turning PT supine while on vent- uneventful. ETT holder placed on PT face- face skin integrity WNL at this time.

## 2019-12-12 DIAGNOSIS — U071 COVID-19: Secondary | ICD-10-CM | POA: Diagnosis not present

## 2019-12-12 DIAGNOSIS — J80 Acute respiratory distress syndrome: Secondary | ICD-10-CM | POA: Diagnosis not present

## 2019-12-12 DIAGNOSIS — N179 Acute kidney failure, unspecified: Secondary | ICD-10-CM

## 2019-12-12 DIAGNOSIS — J9601 Acute respiratory failure with hypoxia: Secondary | ICD-10-CM | POA: Diagnosis not present

## 2019-12-12 LAB — GLUCOSE, CAPILLARY
Glucose-Capillary: 120 mg/dL — ABNORMAL HIGH (ref 70–99)
Glucose-Capillary: 125 mg/dL — ABNORMAL HIGH (ref 70–99)
Glucose-Capillary: 137 mg/dL — ABNORMAL HIGH (ref 70–99)
Glucose-Capillary: 133 mg/dL — ABNORMAL HIGH (ref 70–99)
Glucose-Capillary: 148 mg/dL — ABNORMAL HIGH (ref 70–99)

## 2019-12-12 LAB — CBC WITH DIFFERENTIAL/PLATELET
Abs Immature Granulocytes: 0.23 10*3/uL — ABNORMAL HIGH (ref 0.00–0.07)
Basophils Absolute: 0 10*3/uL (ref 0.0–0.1)
Basophils Relative: 0 %
Eosinophils Absolute: 0.3 10*3/uL (ref 0.0–0.5)
Eosinophils Relative: 2 %
HCT: 34.3 % — ABNORMAL LOW (ref 39.0–52.0)
Hemoglobin: 10.7 g/dL — ABNORMAL LOW (ref 13.0–17.0)
Immature Granulocytes: 1 %
Lymphocytes Relative: 4 %
Lymphs Abs: 0.8 10*3/uL (ref 0.7–4.0)
MCH: 31.3 pg (ref 26.0–34.0)
MCHC: 31.2 g/dL (ref 30.0–36.0)
MCV: 100.3 fL — ABNORMAL HIGH (ref 80.0–100.0)
Monocytes Absolute: 0.9 10*3/uL (ref 0.1–1.0)
Monocytes Relative: 5 %
Neutro Abs: 16.8 10*3/uL — ABNORMAL HIGH (ref 1.7–7.7)
Neutrophils Relative %: 88 %
Platelets: 149 10*3/uL — ABNORMAL LOW (ref 150–400)
RBC: 3.42 MIL/uL — ABNORMAL LOW (ref 4.22–5.81)
RDW: 13.2 % (ref 11.5–15.5)
WBC: 19 10*3/uL — ABNORMAL HIGH (ref 4.0–10.5)
nRBC: 0 % (ref 0.0–0.2)

## 2019-12-12 LAB — COMPREHENSIVE METABOLIC PANEL
ALT: 95 U/L — ABNORMAL HIGH (ref 0–44)
AST: 41 U/L (ref 15–41)
Albumin: 1.9 g/dL — ABNORMAL LOW (ref 3.5–5.0)
Alkaline Phosphatase: 66 U/L (ref 38–126)
Anion gap: 7 (ref 5–15)
BUN: 100 mg/dL — ABNORMAL HIGH (ref 8–23)
CO2: 29 mmol/L (ref 22–32)
Calcium: 7.9 mg/dL — ABNORMAL LOW (ref 8.9–10.3)
Chloride: 109 mmol/L (ref 98–111)
Creatinine, Ser: 2.24 mg/dL — ABNORMAL HIGH (ref 0.61–1.24)
GFR calc non Af Amer: 29 mL/min — ABNORMAL LOW (ref >60)
Glucose, Bld: 170 mg/dL — ABNORMAL HIGH (ref 70–99)
Potassium: 4.8 mmol/L (ref 3.5–5.1)
Sodium: 145 mmol/L (ref 135–145)
Total Bilirubin: 0.7 mg/dL (ref 0.3–1.2)
Total Protein: 5.7 g/dL — ABNORMAL LOW (ref 6.5–8.1)

## 2019-12-12 LAB — PROTIME-INR
INR: 1.2 (ref 0.8–1.2)
Prothrombin Time: 14.7 seconds (ref 11.4–15.2)

## 2019-12-12 LAB — D-DIMER, QUANTITATIVE: D-Dimer, Quant: 2.45 ug/mL-FEU — ABNORMAL HIGH (ref 0.00–0.50)

## 2019-12-12 LAB — PHOSPHORUS: Phosphorus: 3.5 mg/dL (ref 2.5–4.6)

## 2019-12-12 LAB — MAGNESIUM: Magnesium: 3.4 mg/dL — ABNORMAL HIGH (ref 1.7–2.4)

## 2019-12-12 LAB — FIBRINOGEN: Fibrinogen: >800 mg/dL — ABNORMAL HIGH (ref 210–475)

## 2019-12-12 MED ORDER — ACETAMINOPHEN 160 MG/5ML PO SOLN
650.0000 mg | Freq: Four times a day (QID) | ORAL | Status: DC | PRN
  Administered 2019-12-12 – 2019-12-13 (×2): 650 mg
  Filled 2019-12-12 (×2): qty 20.3

## 2019-12-12 MED ORDER — STERILE WATER FOR INJECTION IJ SOLN
INTRAMUSCULAR | Status: AC
  Filled 2019-12-12: qty 10

## 2019-12-12 MED ORDER — PANTOPRAZOLE SODIUM 40 MG PO PACK
40.0000 mg | PACK | Freq: Every day | ORAL | Status: DC
  Administered 2019-12-12 – 2019-12-30 (×19): 40 mg
  Filled 2019-12-12 (×19): qty 20

## 2019-12-12 MED ORDER — LIP MEDEX EX OINT
TOPICAL_OINTMENT | CUTANEOUS | Status: AC
  Filled 2019-12-12: qty 7

## 2019-12-12 NOTE — Progress Notes (Signed)
This RN went in to do hourly care on patient and found bleeding from mouth. MD Elsworth Soho verbal order to keep nasal packing in place and hold subcutaneous heparin at this time. Will continue to monitor for more bleeding.

## 2019-12-12 NOTE — Progress Notes (Signed)
NAME:  Ronald Olsen, MRN:  [id number], DOB:  Mar 09, 1950, LOS: 64 ADMISSION DATE:  11/14/2019, CONSULTATION DATE:  10/4 REFERRING MD:  Doreatha Martin, CHIEF COMPLAINT:  Respiratory failure in setting of COVID-19   Brief History   69 year old male patient admitted on 9/20 with Covid pneumonia. Treated with supplemental oxygen, systemic steroids, remdesivir and baricitinib. Progressive resp failure, intubated 10/5. Evolving ARF. Treating empirically for possible HCAP   Past Medical History  Hypertension, dyslipidemia. Coronary artery disease,  Significant Hospital Events   9/20:  CXR w/ bilateral infiltrates. + COVID-19, CRP 6.3, ddimer 1.67, PCT <0.10, BNP was neg.  He was started on high dose steroids, Remdesivirm and baricitinib.  9/21: 15 liters high flow.  9/22 CRP down 3.4 but still on high FIO2 9/25 completed day 5 of Remdesivir. Still on high dose steroids and Baricitinib  9/26 increased oxygen demand. Placed on NRB and high flow.  9/28 rapid desaturation w/ any movement  9/29 CT angio: negative for PE  9/30: no better  10/4 worsening rapid response called. PCCM asked to see w/ tranfers to ICU 10/5 intubated. Replaced w larger 8.0 ETT 10/5 for intermittent cuffleak. Epistaxis  10/6  prone positioning -FiO2 0.60, PEEP 10 ,Continued oozing from nare, even after packing.  Heparin held  Consults:  Pulmonary consulted 10/4  Procedures:  ETT 10/5 >>  PICC 10/5 >>   Significant Diagnostic Tests:  CT chest 9/29 showed multifocal groundglass attenuations bilaterally through all lobes Echocardiogram 10/5 >> Intact LV function 84-16%, grade 1 diastolic dysfunction, normal RV function and size 10/7 venous duplex negative  Micro Data:  9/29:SARS/coronavirus 2: Positive. 9/20 blood culture x2: Negative.  Antimicrobials:  Remdesivir 9/20 through 9/25 vanc 10/3 >> 10/6 Ctx 10/3>>10/4 Cefepime 10/4>> azith 10/3>> 10/6  Interim history/subjective:   Critically ill,  intubated Sedated on Versed and fentanyl, mild asynchrony. On 50%/PEEP of 10 with saturations 8485% this morning Good urine output with Lasix  Objective   Blood pressure 118/64, pulse (!) 104, temperature 98.1 F (36.7 C), temperature source Axillary, resp. rate (!) 21, height 5\' 11"  (1.803 m), weight 91.7 kg, SpO2 90 %. CVP:  [4 mmHg-7 mmHg] 7 mmHg  Vent Mode: PRVC FiO2 (%):  [50 %-60 %] 60 % Set Rate:  [35 bmp-36 bmp] 35 bmp Vt Set:  [450 mL] 450 mL PEEP:  [10 cmH20] 10 cmH20 Plateau Pressure:  [16 cmH20-26 cmH20] 16 cmH20   Intake/Output Summary (Last 24 hours) at 12/12/2019 1021 Last data filed at 12/12/2019 0600 Gross per 24 hour  Intake 861.16 ml  Output 1400 ml  Net -538.84 ml   Filed Weights   12/08/19 1200 12/09/19 0600 12/12/19 0500  Weight: 89.4 kg 88 kg 91.7 kg    Examination: Gen:      elderly man, no distress , intubated and sedated HEENT:  EOMI, sclera anicteric, mild pallor , left nostril packed with crusted blood around nose Neck:     No JVD; no thyromegaly Lungs:    Bilateral ventilated breath sounds , mild asynchrony CV:         Regular rate and rhythm; no murmurs Abd:      + bowel sounds; soft, non-tender; no palpable masses, no distension Ext:    No edema; adequate peripheral perfusion Skin:      Warm and dry; no rash Neuro: Sedated, RASS -3  Chest x-ray 10/7 independently reviewed shows stable bilateral infiltrates Labs show slight decrease in creatinine although BUN continues to rise, decrease leukocytosis , stable anemia  and thrombocytopenia  Resolved Hospital Problem list    Mixed acidosis -Bicarb weaned to off 10/6  Assessment & Plan:  ARDS due to Covid pneumonia  -Completed baricitinib and remdesivir regimens -ET tube 10/5 with intermittent cuff leak, changed to a 8.0 tube  10/5 Plan Continue low tidal volume ventilation per ARDS protocol Target TV 6-8cc/kgIBW Target Plateau Pressure < 30cm H20 , current 24 Target driving pressure less  than 15 cm of water , current 14 Target PaO2 55-65: titrate PEEP/FiO2 per protocol No need for prone position since PF ratio has improved Check CVP daily if CVL in place Holding diuresis since BUN climbing Use paralytic only for severe asynchrony    Shock, In the setting of sedating medications.  Consider sepsis.  Echocardiogram 10/5 reassuring Norepi weaned to off 10/7 -Continue empiric cefepime until 10/9 for possible superimposed bacterial pneumonia Therapeutic enoxaparin and subsequently heparin gtt held due to epistaxis.  Since venous duplex negative and echo did not show elevated RVSP, changed to prophylactic heparin   Acute renal failure, nonoliguric. Hopefully has plateaued Follow BMP, UOP Renal dose medications and restore adequate perfusion  Epistaxis, suspect traumatic from NG tube attempt.  INR 1.4 Enoxaparin, aspirin, Plavix held.  Attempted heparin gtt on 10/6 but held due to continued nasal blood.  Appears to have stopped -DC nasal packing -Can switch back to prophylactic heparin -Resume Plavix if no further bleeding  May need to call ENT if persists.  Severe leukocytosis, suspect steroid and stress Plan Continue Empiric antibiotics as ordered Follow CBC and cx data No diarrhea to suggest C diff  Mild hyperglycemia. Suspect steroid-induced Plan Insulin ssi   Best practice:  Diet: Diet as tolerated Pain/Anxiety/Delirium protocol (if indicated): versed, fentanyl VAP protocol (if indicated): Ordered DVT prophylaxis: Enoxaparin  GI prophylaxis: PPI Glucose control sliding scale insulin Mobility: Bedrest Code Status: DNR Family Communication:  wife updated 10/8 Disposition:  ICU  Labs   CBC: Recent Labs  Lab 12/08/19 0500 12/08/19 0500 12/09/19 0327 12/09/19 0327 12/09/19 2150 12/10/19 0352 12/10/19 2154 12/11/19 0307 12/12/19 0537  WBC 32.1*   < > 29.3*  --   --  44.8* 29.0* 25.7* 19.0*  NEUTROABS 29.2*  --  27.7*  --   --  40.9*  --  22.8*  16.8*  HGB 14.1   < > 13.9   < > 13.2 12.4* 12.0* 11.3* 10.7*  HCT 41.2   < > 42.0   < > 42.3 37.8* 37.2* 36.7* 34.3*  MCV 91.6   < > 93.5  --   --  94.0 96.9 99.7 100.3*  PLT 261   < > 226  --   --  258 190 176 149*   < > = values in this interval not displayed.    Basic Metabolic Panel: Recent Labs  Lab 12/08/19 0500 12/09/19 0327 12/10/19 0352 12/10/19 0843 12/10/19 2154 12/11/19 0307 12/12/19 0537  NA 131*   < > 140 140 144 145 145  K 5.0   < > 6.4* 5.6* 5.2* 5.1 4.8  CL 97*   < > 104 101 104 105 109  CO2 25   < > 27 29 28 30 29   GLUCOSE 113*   < > 126* 125* 114* 113* 170*  BUN 53*   < > 66* 71* 82* 88* 100*  CREATININE 1.10   < > 2.16* 2.47* 2.71* 2.64* 2.24*  CALCIUM 8.7*   < > 7.9* 7.6* 7.6* 7.9* 7.9*  MG 2.4  --   --   --   --  3.1* 3.4*  PHOS 3.7  --   --   --   --  5.9* 3.5   < > = values in this interval not displayed.   GFR: Estimated Creatinine Clearance: 37.1 mL/min (A) (by C-G formula based on SCr of 2.24 mg/dL (H)). Recent Labs  Lab 12/06/19 0543 12/07/19 0524 12/10/19 0352 12/10/19 2154 12/11/19 0307 12/12/19 0537  PROCALCITON <0.10  --   --   --   --   --   WBC 30.0*   < > 44.8* 29.0* 25.7* 19.0*  LATICACIDVEN  --   --   --   --  1.0  --    < > = values in this interval not displayed.    Liver Function Tests: Recent Labs  Lab 12/08/19 0500 12/09/19 0327 12/10/19 0352 12/11/19 0307 12/12/19 0537  AST 37 38 33 35 41  ALT 82* 80* 76* 90* 95*  ALKPHOS 72 80 78 64 66  BILITOT 0.9 1.0 0.9 0.8 0.7  PROT 6.4* 6.7 5.9* 5.7* 5.7*  ALBUMIN 2.4* 2.5* 2.2* 2.0* 1.9*   No results for input(s): LIPASE, AMYLASE in the last 168 hours. No results for input(s): AMMONIA in the last 168 hours.  ABG    Component Value Date/Time   PHART 7.347 (L) 12/11/2019 1120   PCO2ART 56.2 (H) 12/11/2019 1120   PO2ART 90.6 12/11/2019 1120   HCO3 30.0 (H) 12/11/2019 1120   ACIDBASEDEF 0.2 12/10/2019 0500   O2SAT 95.5 12/11/2019 1120     Coagulation  Profile: Recent Labs  Lab 12/10/19 0352 12/12/19 0537  INR 1.4* 1.2    Cardiac Enzymes: No results for input(s): CKTOTAL, CKMB, CKMBINDEX, TROPONINI in the last 168 hours.  HbA1C: Hgb A1c MFr Bld  Date/Time Value Ref Range Status  11/25/2019 05:59 AM 5.9 (H) 4.8 - 5.6 % Final    Comment:    (NOTE) Pre diabetes:          5.7%-6.4%  Diabetes:              >6.4%  Glycemic control for   <7.0% adults with diabetes     CBG: Recent Labs  Lab 12/11/19 1825 12/11/19 1858 12/11/19 1951 12/11/19 2325 12/12/19 0317  GLUCAP 62* 142* 127* 106* 120*     Critical care time: 35 min    The patient is critically ill with multiple organ systems failure and requires high complexity decision making for assessment and support, frequent evaluation and titration of therapies, application of advanced monitoring technologies and extensive interpretation of multiple databases. Critical Care Time devoted to patient care services described in this note independent of APP/resident  time is 35 minutes.   Kara Mead MD. Shade Flood. [facility] See Amion for pager  If no response to pager , please call [phone]  After 7:00 pm call Elink  [phone]   12/12/2019

## 2019-12-12 NOTE — Progress Notes (Signed)
OT Cancellation Note  Patient Details Name: Ronald Olsen MRN: [id number] DOB: September 08, 1950   Cancelled Treatment:    Reason Eval/Treat Not Completed: Medical issues which prohibited therapy, intubated + sedated. Please re-consult when patient medically able to participate.   Delbert Phenix OT OT pager: [place] 12/12/2019, 6:34 AM

## 2019-12-12 NOTE — Progress Notes (Signed)
eLink Physician-Brief Progress Note Patient Name: [name] DOB: 03-14-1950 MRN: [id number]   Date of Service  12/12/2019  HPI/Events of Note  Bedside RN is asking if she is to give patient's scheduled DVT prophylaxis Heparin dose tonight.  eICU Interventions  RN instructed to give dose of Heparin.        Kerry Kass Luwana Butrick 12/12/2019, 11:02 PM

## 2019-12-13 ENCOUNTER — Inpatient Hospital Stay (HOSPITAL_COMMUNITY): Payer: Medicare Other

## 2019-12-13 DIAGNOSIS — K1379 Other lesions of oral mucosa: Secondary | ICD-10-CM | POA: Diagnosis not present

## 2019-12-13 DIAGNOSIS — J80 Acute respiratory distress syndrome: Secondary | ICD-10-CM | POA: Diagnosis not present

## 2019-12-13 DIAGNOSIS — U071 COVID-19: Secondary | ICD-10-CM | POA: Diagnosis not present

## 2019-12-13 DIAGNOSIS — N179 Acute kidney failure, unspecified: Secondary | ICD-10-CM | POA: Diagnosis not present

## 2019-12-13 DIAGNOSIS — L8990 Pressure ulcer of unspecified site, unspecified stage: Secondary | ICD-10-CM | POA: Insufficient documentation

## 2019-12-13 LAB — GLUCOSE, CAPILLARY
Glucose-Capillary: 116 mg/dL — ABNORMAL HIGH (ref 70–99)
Glucose-Capillary: 128 mg/dL — ABNORMAL HIGH (ref 70–99)
Glucose-Capillary: 119 mg/dL — ABNORMAL HIGH (ref 70–99)
Glucose-Capillary: 110 mg/dL — ABNORMAL HIGH (ref 70–99)
Glucose-Capillary: 123 mg/dL — ABNORMAL HIGH (ref 70–99)
Glucose-Capillary: 113 mg/dL — ABNORMAL HIGH (ref 70–99)
Glucose-Capillary: 109 mg/dL — ABNORMAL HIGH (ref 70–99)

## 2019-12-13 LAB — CBC WITH DIFFERENTIAL/PLATELET
Abs Immature Granulocytes: 0.24 10*3/uL — ABNORMAL HIGH (ref 0.00–0.07)
Basophils Absolute: 0 10*3/uL (ref 0.0–0.1)
Basophils Relative: 0 %
Eosinophils Absolute: 0.5 10*3/uL (ref 0.0–0.5)
Eosinophils Relative: 3 %
HCT: 29.6 % — ABNORMAL LOW (ref 39.0–52.0)
Hemoglobin: 9 g/dL — ABNORMAL LOW (ref 13.0–17.0)
Immature Granulocytes: 2 %
Lymphocytes Relative: 8 %
Lymphs Abs: 1.1 10*3/uL (ref 0.7–4.0)
MCH: 30.9 pg (ref 26.0–34.0)
MCHC: 30.4 g/dL (ref 30.0–36.0)
MCV: 101.7 fL — ABNORMAL HIGH (ref 80.0–100.0)
Monocytes Absolute: 0.7 10*3/uL (ref 0.1–1.0)
Monocytes Relative: 5 %
Neutro Abs: 11.1 10*3/uL — ABNORMAL HIGH (ref 1.7–7.7)
Neutrophils Relative %: 82 %
Platelets: 120 10*3/uL — ABNORMAL LOW (ref 150–400)
RBC: 2.91 MIL/uL — ABNORMAL LOW (ref 4.22–5.81)
RDW: 13.6 % (ref 11.5–15.5)
WBC: 13.6 10*3/uL — ABNORMAL HIGH (ref 4.0–10.5)
nRBC: 0 % (ref 0.0–0.2)

## 2019-12-13 LAB — BASIC METABOLIC PANEL
Anion gap: 6 (ref 5–15)
BUN: 103 mg/dL — ABNORMAL HIGH (ref 8–23)
CO2: 28 mmol/L (ref 22–32)
Calcium: 7 mg/dL — ABNORMAL LOW (ref 8.9–10.3)
Chloride: 113 mmol/L — ABNORMAL HIGH (ref 98–111)
Creatinine, Ser: 2.46 mg/dL — ABNORMAL HIGH (ref 0.61–1.24)
GFR, Estimated: 26 mL/min — ABNORMAL LOW (ref >60)
Glucose, Bld: 116 mg/dL — ABNORMAL HIGH (ref 70–99)
Potassium: 4.6 mmol/L (ref 3.5–5.1)
Sodium: 147 mmol/L — ABNORMAL HIGH (ref 135–145)

## 2019-12-13 LAB — PHOSPHORUS: Phosphorus: 2.6 mg/dL (ref 2.5–4.6)

## 2019-12-13 LAB — D-DIMER, QUANTITATIVE: D-Dimer, Quant: 2.09 ug/mL-FEU — ABNORMAL HIGH (ref 0.00–0.50)

## 2019-12-13 LAB — MAGNESIUM: Magnesium: 3.2 mg/dL — ABNORMAL HIGH (ref 1.7–2.4)

## 2019-12-13 MED ORDER — DOCUSATE SODIUM 100 MG PO CAPS: 100.0000 mg | ORAL_CAPSULE | Freq: Two times a day (BID) | ORAL | Status: DC

## 2019-12-13 MED ORDER — FREE WATER: 300.0000 mL | Status: DC

## 2019-12-13 MED ORDER — OXYMETAZOLINE HCL 0.05 % NA SOLN
2.0000 | NASAL | Status: AC
  Administered 2019-12-13 (×3): 2 via NASAL
  Filled 2019-12-13: qty 15

## 2019-12-13 MED ORDER — POLYETHYLENE GLYCOL 3350 17 G PO PACK
17.0000 g | PACK | Freq: Every day | ORAL | Status: DC
  Administered 2019-12-13 – 2019-12-30 (×13): 17 g
  Filled 2019-12-13 (×11): qty 1

## 2019-12-13 MED ORDER — DOCUSATE SODIUM 50 MG/5ML PO LIQD
100.0000 mg | Freq: Two times a day (BID) | ORAL | Status: DC
  Administered 2019-12-13 – 2019-12-14 (×2): 100 mg via ORAL
  Filled 2019-12-13 (×2): qty 10

## 2019-12-13 MED ORDER — FREE WATER
300.0000 mL | Status: DC
  Administered 2019-12-13 – 2019-12-23 (×73): 300 mL

## 2019-12-13 NOTE — Progress Notes (Signed)
12/13/2019 Contacted Dr. Marcelline Deist, ENT, at [phone], per request of Dr. Elsworth Soho, notifying him of the following: - Afrin dosages administered x 2 with improvement in nasal bleeding; clotted blood below nares not dislodged due to concern about additional bleeding. - Swabs from mouthcare are coated in blood at each instance of oral care (q 2hrs). - Per Dr. Elsworth Soho, noted that Hgb has dropped from 10.7 on 10/8 to 9.0 on 10/9. - Also noted 10/9 D-Dimer result of 2.09 (decreased) and 10/8 INR of 1.2. - MD requested ENT cart and 3" Kerlix for packing hypopharynx. Noted that patient has oral ETT and orogastric tube with feedings. Supplies obtained as ordered.  Cindy S. Brigitte Pulse BSN, RN, [place] 12/13/2019 5:48 PM   12/13/2019 Patient seen by Dr. Marcelline Deist, felt packing not indicated at this time. Dr. Marcelline Deist discussed with Dr. Elsworth Soho, on unit. Supplies to be retained at present. Cindy S. Brigitte Pulse BSN, RN, [place] 12/13/2019 7:05 PM

## 2019-12-13 NOTE — Progress Notes (Addendum)
Patient ID: Ronald Olsen, male   DOB: [birth year], 69 y.o.   MRN: [id number]   Reason for Consult: Oropharyngeal bleeding Referring Physician: ICU service  Ronald Olsen is an 69 y.o. male.  HPI: Ronald Olsen is a 69 year old male unvaccinated to Covid now with progressive Covid pneumonia.  He is intubated and sedated.  He has had ongoing bleeding mainly in his oral cavity noticed every 2 hours when the nurse swabs his oral cavity.  It is dark red in nature and approximately 40 to 50 cc of old blood and saliva in the suction container since 0800 this morning.  He had some sort of nasal pack placed several days ago which was removed this morning by the ICU physician.  There was a concern about a drop in hemoglobin over the past couple of days and whether or not this ongoing oozing may be its cause.  I was consulted originally this morning at which time instructions were given to apply Afrin to both sides of the nose every 4 hours as needed.  This helped considerably although the dark red blood from the oral cavity is still seen with suctioning.  I was called back to come in and reevaluate.  Past Medical History:  Diagnosis Date  . Elevated PSA   . Family history of prostate problems   . Frequent headaches   . GERD (gastroesophageal reflux disease)   . Hyperlipidemia   . Hypertension   . Migraines     Past Surgical History:  Procedure Laterality Date  . APPENDECTOMY    . CHOLECYSTECTOMY    . HERNIA REPAIR    . VASECTOMY      Family History  Problem Relation Age of Onset  . Hypertension Mother   . Cancer Father   . Diabetes Father   . Heart attack Father   . Heart disease Father   . Hyperlipidemia Father   . Hypertension Father   . Hypertension Brother   . Hyperlipidemia Brother   . Heart disease Brother   . Hearing loss Brother   . Early death Maternal Grandfather   . Diabetes Paternal Grandmother   . Early death Paternal Grandfather   . Heart disease Paternal  Grandfather     Social History:  reports that he has quit smoking. He has quit using smokeless tobacco. He reports current alcohol use. He reports that he does not use drugs.  Allergies:  Allergies  Allergen Reactions  . Amoxicillin Other (See Comments)    "Cloudy headed" with long doses    Medications: I have reviewed the patient's current medications.  Results for orders placed or performed during the hospital encounter of 11/20/2019 (from the past 48 hour(s))  Glucose, capillary     Status: Abnormal   Collection Time: 12/11/19  6:25 PM  Result Value Ref Range   Glucose-Capillary 62 (L) 70 - 99 mg/dL    Comment: Glucose reference range applies only to samples taken after fasting for at least 8 hours.  Glucose, capillary     Status: Abnormal   Collection Time: 12/11/19  6:58 PM  Result Value Ref Range   Glucose-Capillary 142 (H) 70 - 99 mg/dL    Comment: Glucose reference range applies only to samples taken after fasting for at least 8 hours.  Glucose, capillary     Status: Abnormal   Collection Time: 12/11/19  7:51 PM  Result Value Ref Range   Glucose-Capillary 127 (H) 70 - 99 mg/dL    Comment: Glucose reference  range applies only to samples taken after fasting for at least 8 hours.   Comment 1 Notify RN    Comment 2 Document in Chart   Glucose, capillary     Status: Abnormal   Collection Time: 12/11/19 11:25 PM  Result Value Ref Range   Glucose-Capillary 106 (H) 70 - 99 mg/dL    Comment: Glucose reference range applies only to samples taken after fasting for at least 8 hours.   Comment 1 Notify RN    Comment 2 Document in Chart   Glucose, capillary     Status: Abnormal   Collection Time: 12/12/19  3:17 AM  Result Value Ref Range   Glucose-Capillary 120 (H) 70 - 99 mg/dL    Comment: Glucose reference range applies only to samples taken after fasting for at least 8 hours.   Comment 1 Notify RN    Comment 2 Document in Chart   CBC with Differential/Platelet     Status:  Abnormal   Collection Time: 12/12/19  5:37 AM  Result Value Ref Range   WBC 19.0 (H) 4.0 - 10.5 K/uL   RBC 3.42 (L) 4.22 - 5.81 MIL/uL   Hemoglobin 10.7 (L) 13.0 - 17.0 g/dL   HCT 34.3 (L) 39 - 52 %   MCV 100.3 (H) 80.0 - 100.0 fL   MCH 31.3 26.0 - 34.0 pg   MCHC 31.2 30.0 - 36.0 g/dL   RDW 13.2 11.5 - 15.5 %   Platelets 149 (L) 150 - 400 K/uL   nRBC 0.0 0.0 - 0.2 %   Neutrophils Relative % 88 %   Neutro Abs 16.8 (H) 1.7 - 7.7 K/uL   Lymphocytes Relative 4 %   Lymphs Abs 0.8 0.7 - 4.0 K/uL   Monocytes Relative 5 %   Monocytes Absolute 0.9 0.1 - 1.0 K/uL   Eosinophils Relative 2 %   Eosinophils Absolute 0.3 0 - 0 K/uL   Basophils Relative 0 %   Basophils Absolute 0.0 0 - 0 K/uL   Immature Granulocytes 1 %   Abs Immature Granulocytes 0.23 (H) 0.00 - 0.07 K/uL    Comment: Performed at [facility], [place] [address], [place], [place] [address]  D-dimer, quantitative (not at [facility])     Status: Abnormal   Collection Time: 12/12/19  5:37 AM  Result Value Ref Range   D-Dimer, Quant 2.45 (H) 0.00 - 0.50 ug/mL-FEU    Comment: (NOTE) At the manufacturer cut-off value of 0.5 g/mL FEU, this assay has a negative predictive value of 95-100%.This assay is intended for use in conjunction with a clinical pretest probability (PTP) assessment model to exclude pulmonary embolism (PE) and deep venous thrombosis (DVT) in outpatients suspected of PE or DVT. Results should be correlated with clinical presentation. Performed at [facility], [place] [address], [place], [place] [address]   Comprehensive metabolic panel     Status: Abnormal   Collection Time: 12/12/19  5:37 AM  Result Value Ref Range   Sodium 145 135 - 145 mmol/L   Potassium 4.8 3.5 - 5.1 mmol/L   Chloride 109 98 - 111 mmol/L   CO2 29 22 - 32 mmol/L   Glucose, Bld 170 (H) 70 - 99 mg/dL    Comment: Glucose reference range applies only to samples taken after fasting for at least 8 hours.   BUN 100  (H) 8 - 23 mg/dL   Creatinine, Ser 2.24 (H) 0.61 - 1.24 mg/dL   Calcium 7.9 (L) 8.9 -  10.3 mg/dL   Total Protein 5.7 (L) 6.5 - 8.1 g/dL   Albumin 1.9 (L) 3.5 - 5.0 g/dL   AST 41 15 - 41 U/L   ALT 95 (H) 0 - 44 U/L   Alkaline Phosphatase 66 38 - 126 U/L   Total Bilirubin 0.7 0.3 - 1.2 mg/dL   GFR calc non Af Amer 29 (L) >60 mL/min   Anion gap 7 5 - 15    Comment: Performed at [facility], [place] [address], [place], [place] [address]  Magnesium     Status: Abnormal   Collection Time: 12/12/19  5:37 AM  Result Value Ref Range   Magnesium 3.4 (H) 1.7 - 2.4 mg/dL    Comment: Performed at [facility], [place] [address], [place], [place] [address]  Phosphorus     Status: None   Collection Time: 12/12/19  5:37 AM  Result Value Ref Range   Phosphorus 3.5 2.5 - 4.6 mg/dL    Comment: Performed at [facility], [place] [address] [place], [place] [address]  Protime-INR     Status: None   Collection Time: 12/12/19  5:37 AM  Result Value Ref Range   Prothrombin Time 14.7 11.4 - 15.2 seconds   INR 1.2 0.8 - 1.2    Comment: (NOTE) INR goal varies based on device and disease states. Performed at [facility], [place] [address], [place], [place] [address]   Fibrinogen     Status: Abnormal   Collection Time: 12/12/19  5:37 AM  Result Value Ref Range   Fibrinogen >800 (H) 210 - 475 mg/dL    Comment: Performed at [facility], [place] [address], [place],  [address]  Glucose, capillary     Status: Abnormal   Collection Time: 12/12/19  8:10 AM  Result Value Ref Range   Glucose-Capillary 125 (H) 70 - 99 mg/dL    Comment: Glucose reference range applies only to samples taken after fasting for at least 8 hours.  Glucose, capillary     Status: Abnormal   Collection Time: 12/12/19 11:58 AM  Result Value Ref Range   Glucose-Capillary 137 (H) 70 - 99 mg/dL    Comment: Glucose reference range applies only to  samples taken after fasting for at least 8 hours.  Glucose, capillary     Status: Abnormal   Collection Time: 12/12/19  4:24 PM  Result Value Ref Range   Glucose-Capillary 133 (H) 70 - 99 mg/dL    Comment: Glucose reference range applies only to samples taken after fasting for at least 8 hours.  Glucose, capillary     Status: Abnormal   Collection Time: 12/12/19  7:41 PM  Result Value Ref Range   Glucose-Capillary 148 (H) 70 - 99 mg/dL    Comment: Glucose reference range applies only to samples taken after fasting for at least 8 hours.  Glucose, capillary     Status: Abnormal   Collection Time: 12/13/19 12:05 AM  Result Value Ref Range   Glucose-Capillary 128 (H) 70 - 99 mg/dL    Comment: Glucose reference range applies only to samples taken after fasting for at least 8 hours.  CBC with Differential/Platelet     Status: Abnormal   Collection Time: 12/13/19  4:00 AM  Result Value Ref Range   WBC 13.6 (H) 4.0 - 10.5 K/uL   RBC 2.91 (L) 4.22 - 5.81 MIL/uL   Hemoglobin 9.0 (L) 13.0 - 17.0 g/dL   HCT 29.6 (  L) 39 - 52 %   MCV 101.7 (H) 80.0 - 100.0 fL   MCH 30.9 26.0 - 34.0 pg   MCHC 30.4 30.0 - 36.0 g/dL   RDW 13.6 11.5 - 15.5 %   Platelets 120 (L) 150 - 400 K/uL   nRBC 0.0 0.0 - 0.2 %   Neutrophils Relative % 82 %   Neutro Abs 11.1 (H) 1.7 - 7.7 K/uL   Lymphocytes Relative 8 %   Lymphs Abs 1.1 0.7 - 4.0 K/uL   Monocytes Relative 5 %   Monocytes Absolute 0.7 0.1 - 1.0 K/uL   Eosinophils Relative 3 %   Eosinophils Absolute 0.5 0 - 0 K/uL   Basophils Relative 0 %   Basophils Absolute 0.0 0 - 0 K/uL   Immature Granulocytes 2 %   Abs Immature Granulocytes 0.24 (H) 0.00 - 0.07 K/uL    Comment: Performed at [facility], [place] [address], [place], [place] [address]  D-dimer, quantitative (not at [facility])     Status: Abnormal   Collection Time: 12/13/19  4:00 AM  Result Value Ref Range   D-Dimer, Quant 2.09 (H) 0.00 - 0.50 ug/mL-FEU    Comment: (NOTE) At the  manufacturer cut-off value of 0.5 g/mL FEU, this assay has a negative predictive value of 95-100%.This assay is intended for use in conjunction with a clinical pretest probability (PTP) assessment model to exclude pulmonary embolism (PE) and deep venous thrombosis (DVT) in outpatients suspected of PE or DVT. Results should be correlated with clinical presentation. Performed at [facility], [place] [address], [place], [place] [address]   Basic metabolic panel     Status: Abnormal   Collection Time: 12/13/19  4:00 AM  Result Value Ref Range   Sodium 147 (H) 135 - 145 mmol/L   Potassium 4.6 3.5 - 5.1 mmol/L   Chloride 113 (H) 98 - 111 mmol/L   CO2 28 22 - 32 mmol/L   Glucose, Bld 116 (H) 70 - 99 mg/dL    Comment: Glucose reference range applies only to samples taken after fasting for at least 8 hours.   BUN 103 (H) 8 - 23 mg/dL    Comment: RESULTS CONFIRMED BY MANUAL DILUTION   Creatinine, Ser 2.46 (H) 0.61 - 1.24 mg/dL   Calcium 7.0 (L) 8.9 - 10.3 mg/dL   GFR, Estimated 26 (L) >60 mL/min   Anion gap 6 5 - 15    Comment: Performed at [facility], [place] [address], [place], [place] [address]  Magnesium     Status: Abnormal   Collection Time: 12/13/19  4:00 AM  Result Value Ref Range   Magnesium 3.2 (H) 1.7 - 2.4 mg/dL    Comment: Performed at [facility], [place] [address], [place], [place] [address]  Phosphorus     Status: None   Collection Time: 12/13/19  4:00 AM  Result Value Ref Range   Phosphorus 2.6 2.5 - 4.6 mg/dL    Comment: Performed at [facility], [place] [address], [place], [place] [address]  Glucose, capillary     Status: Abnormal   Collection Time: 12/13/19  4:09 AM  Result Value Ref Range   Glucose-Capillary 116 (H) 70 - 99 mg/dL    Comment: Glucose reference range applies only to samples taken after fasting for at least 8 hours.  Glucose, capillary     Status: Abnormal   Collection Time:  12/13/19  8:41 AM  Result Value Ref Range  Glucose-Capillary 119 (H) 70 - 99 mg/dL    Comment: Glucose reference range applies only to samples taken after fasting for at least 8 hours.  Glucose, capillary     Status: Abnormal   Collection Time: 12/13/19 12:46 PM  Result Value Ref Range   Glucose-Capillary 123 (H) 70 - 99 mg/dL    Comment: Glucose reference range applies only to samples taken after fasting for at least 8 hours.  Glucose, capillary     Status: Abnormal   Collection Time: 12/13/19  4:14 PM  Result Value Ref Range   Glucose-Capillary 110 (H) 70 - 99 mg/dL    Comment: Glucose reference range applies only to samples taken after fasting for at least 8 hours.    DG Chest Port 1 View  Result Date: 12/13/2019 CLINICAL DATA:  Acute respiratory failure due to COVID pneumonia. EXAM: PORTABLE CHEST 1 VIEW COMPARISON:  12/11/2019; 12/10/2019; 12/09/2019 FINDINGS: Grossly unchanged cardiac silhouette and mediastinal contours. Tip of left upper extremity approach PICC line remains malpositioned overlying expected location of the azygos vein. Otherwise, stable position of remaining support apparatus. No pneumothorax. Grossly unchanged extensive bilateral interstitial and slightly nodular airspace opacities with relative area of confluence within the left lung base. No pleural effusion. No evidence of edema. No acute osseous abnormalities. IMPRESSION: 1. Tip of left upper extremity approach PICC line remains malpositioned overlying expected location of the azygos vein. Repositioning is advised. 2. Otherwise, stable position of remaining support apparatus. No pneumothorax. 3. Similar findings suggestive of multifocal COVID pneumonia, worse within the left base. Electronically Signed   By: Sandi Mariscal M.D.   On: 12/13/2019 12:20    Review of Systems  Cannot obtain  Blood pressure (!) 117/54, pulse (!) 115, temperature 100.3 F (37.9 C), temperature source Axillary, resp. rate 17, height 5\' 11"   (1.803 m), weight 91.7 kg, SpO2 90 %. Physical Exam  Intubated and sedated with NG tube secured Looking around the room there is no blood on the sheets, pillow, face, or evidence of significant hemorrhage in the trash can.  The patient's suction canister has approximately 40 cc of serous sanguinous dark red saliva-like fluid in it.  There is no blood coming from either side of the nose and no blood emanating from the oral cavity.  The lips are normal with minimal amounts of dried blood on the lower lip.   Assessment/Plan:  Oral cavity bleeding  I spoke with the nurse about this.  She and I both feel that this is unlikely the source for his drop in hemoglobin.  At the most, they have suctioned 40 cc of bloody secretions from his mouth throughout the day today.  This would not cause or contribute to a two-point drop in hemoglobin.  Second, I think it would do more harm than good to try and repacked both sides of his nose given the absence of significant bleeding at this time.  Lastly, I went by the operating room and got some equipment for an oropharyngeal pack using a 4.5 inch Kerlix.  I do not think this is necessary given what I am seeing in the room at this time.  We will leave all of our equipment outside the room and have asked them to call me back if the bleeding becomes more significant.  I would be happy to come back in and reevaluate.  Marcina Millard 12/13/2019, 6:21 PM

## 2019-12-13 NOTE — Progress Notes (Signed)
NAME:  Ronald Olsen, MRN:  [id number], DOB:  [birth year], LOS: 3 ADMISSION DATE:  11/28/2019, CONSULTATION DATE:  10/4 REFERRING MD:  Doreatha Martin, CHIEF COMPLAINT:  Respiratory failure in setting of COVID-19   Brief History   69 year old male patient admitted on 9/20 with Covid pneumonia. Treated with supplemental oxygen, systemic steroids, remdesivir and baricitinib. Progressive resp failure, intubated 10/5. Evolving ARF. Treating empirically for possible HCAP   Past Medical History  Hypertension, dyslipidemia. Coronary artery disease,  Significant Hospital Events   9/20:  CXR w/ bilateral infiltrates. + COVID-19, CRP 6.3, ddimer 1.67, PCT <0.10, BNP was neg.  He was started on high dose steroids, Remdesivirm and baricitinib.  9/21: 15 liters high flow.  9/22 CRP down 3.4 but still on high FIO2 9/25 completed day 5 of Remdesivir. Still on high dose steroids and Baricitinib  9/26 increased oxygen demand. Placed on NRB and high flow.  9/28 rapid desaturation w/ any movement  9/29 CT angio: negative for PE  9/30: no better  10/4 worsening rapid response called. PCCM asked to see w/ tranfers to ICU 10/5 intubated. Replaced w larger 8.0 ETT 10/5 for intermittent cuffleak. Epistaxis  10/6  prone positioning -FiO2 0.60, PEEP 10 ,Continued oozing from nare, even after packing.  Heparin held 10/8 FiO2 increased to 60% due to desaturation  Consults:  Pulmonary consulted 10/4  Procedures:  ETT 10/5 >>  PICC 10/5 >>   Significant Diagnostic Tests:  CT chest 9/29 showed multifocal groundglass attenuations bilaterally through all lobes Echocardiogram 10/5 >> Intact LV function 20-94%, grade 1 diastolic dysfunction, normal RV function and size 10/7 venous duplex negative  Micro Data:  9/29:SARS/coronavirus 2: Positive. 9/20 blood culture x2: Negative.  Antimicrobials:  Remdesivir 9/20 through 9/25 vanc 10/3 >> 10/6 Ctx 10/3>>10/4 Cefepime 10/4>>10/9 azith 10/3>> 10/6  Interim  history/subjective:   Critically ill, intubated Sedated on Versed and fentanyl Remains with nasal packing, RN reports suctioning out some blood from the mouth Afebrile Good urine output  Objective   Blood pressure (!) 117/54, pulse (!) 104, temperature 99.4 F (37.4 C), temperature source Axillary, resp. rate 20, height 5\' 11"  (1.803 m), weight 91.7 kg, SpO2 (!) 89 %. CVP:  [3 mmHg-7 mmHg] 7 mmHg  Vent Mode: PRVC FiO2 (%):  [55 %-60 %] 60 % Set Rate:  [35 bmp] 35 bmp Vt Set:  [450 mL] 450 mL PEEP:  [10 cmH20] 10 cmH20 Plateau Pressure:  [18 BSJ62-83 cmH20] 18 cmH20   Intake/Output Summary (Last 24 hours) at 12/13/2019 1011 Last data filed at 12/13/2019 0900 Gross per 24 hour  Intake 2651.1 ml  Output 1835 ml  Net 816.1 ml   Filed Weights   12/08/19 1200 12/09/19 0600 12/12/19 0500  Weight: 89.4 kg 88 kg 91.7 kg    Examination: Gen:      elderly man, no distress , sedated, intubated HEENT:  EOMI, sclera anicteric, mild pallor, nasal packing with crusted blood around nose Neck:     No JVD; no thyromegaly Lungs:    Bilateral ventilated breath sounds , audible intermittent air leak CV:         Regular rate and rhythm; no murmurs Abd:      + bowel sounds; soft, non-tender; no palpable masses, no distension Ext:    No edema; adequate peripheral perfusion Skin:      Warm and dry; no rash Neuro: Sedated, RASS -5  Chest x-ray 10/9 independently reviewed, no new infiltrates, no pneumothorax  Labs show slight increase in hyponatremia,  slight increase in creatinine, stable BUN, decrease leukocytosis, increased anemia and thrombocytopenia  Resolved Hospital Problem list    Mixed acidosis -Bicarb weaned to off 10/6 Severe leukocytosis, suspect steroid and stress  Assessment & Plan:  ARDS due to Covid pneumonia  -Completed baricitinib and remdesivir regimens -ET tube 10/5 with intermittent cuff leak, changed to a 8.0 tube  10/5 Plan Continue low tidal volume ventilation per  ARDS protocol Target TV 6-8cc/kgIBW Target Plateau Pressure < 30cm H20 , at goal Target driving pressure less than 15 cm of water , at goal Target PaO2 55-65: titrate PEEP/FiO2 per protocol No need for prone position since PF ratio has improved Check CVP daily if CVL in place Holding diuresis since BUN and creatinine climbing Use paralytic only for severe desaturation, allow some asynchrony    Shock, In the setting of sedating medications.  Consider sepsis.  Echocardiogram 10/5 reassuring Norepi weaned to off 10/7, leukocytosis improving -Continue empiric cefepime until 10/9 for possible superimposed bacterial pneumonia Therapeutic enoxaparin and subsequently heparin gtt held due to epistaxis.  -venous duplex negative and echo did not show elevated RVSP, changed to prophylactic heparin   Acute renal failure, nonoliguric. Hopefully has plateaued Follow BMP, UOP Renal dose medications and restore adequate perfusion  Epistaxis, suspect traumatic from NG tube attempt.  INR 1.4 Enoxaparin, aspirin, Plavix held. heparin gtt stopped on 10/6  -DC nasal packing, on exam nare appears with raw mucosa and mild diffuse oozing.  Discussed with ENT, will use Afrin and if bleeding persists then ENT may have to consider cauterization -Hold heparin and Plavix for now -Slight drop in hemoglobin from 10.7-9.0, will monitor   Mild hyperglycemia. Suspect steroid-induced -Better controlled Plan Insulin ssi   Best practice:  Diet: Diet as tolerated Pain/Anxiety/Delirium protocol (if indicated): versed, fentanyl VAP protocol (if indicated): Ordered DVT prophylaxis:hold SQ heparin due to nasal bleeding GI prophylaxis: PPI Glucose control sliding scale insulin Mobility: Bedrest Code Status: DNR Family Communication:  wife updated  Disposition:  ICU , can consider discontinue isolation after 21 days 10/11  Labs   CBC: Recent Labs  Lab 12/09/19 0327 12/09/19 2150 12/10/19 0352 12/10/19 2154  12/11/19 0307 12/12/19 0537 12/13/19 0400  WBC 29.3*  --  44.8* 29.0* 25.7* 19.0* 13.6*  NEUTROABS 27.7*  --  40.9*  --  22.8* 16.8* 11.1*  HGB 13.9   < > 12.4* 12.0* 11.3* 10.7* 9.0*  HCT 42.0   < > 37.8* 37.2* 36.7* 34.3* 29.6*  MCV 93.5  --  94.0 96.9 99.7 100.3* 101.7*  PLT 226  --  258 190 176 149* 120*   < > = values in this interval not displayed.    Basic Metabolic Panel: Recent Labs  Lab 12/08/19 0500 12/09/19 0327 12/10/19 0843 12/10/19 2154 12/11/19 0307 12/12/19 0537 12/13/19 0400  NA 131*   < > 140 144 145 145 147*  K 5.0   < > 5.6* 5.2* 5.1 4.8 4.6  CL 97*   < > 101 104 105 109 113*  CO2 25   < > 29 28 30 29 28   GLUCOSE 113*   < > 125* 114* 113* 170* 116*  BUN 53*   < > 71* 82* 88* 100* 103*  CREATININE 1.10   < > 2.47* 2.71* 2.64* 2.24* 2.46*  CALCIUM 8.7*   < > 7.6* 7.6* 7.9* 7.9* 7.0*  MG 2.4  --   --   --  3.1* 3.4* 3.2*  PHOS 3.7  --   --   --  5.9* 3.5 2.6   < > = values in this interval not displayed.   GFR: Estimated Creatinine Clearance: 33.8 mL/min (A) (by C-G formula based on SCr of 2.46 mg/dL (H)). Recent Labs  Lab 12/10/19 2154 12/11/19 0307 12/12/19 0537 12/13/19 0400  WBC 29.0* 25.7* 19.0* 13.6*  LATICACIDVEN  --  1.0  --   --     Liver Function Tests: Recent Labs  Lab 12/08/19 0500 12/09/19 0327 12/10/19 0352 12/11/19 0307 12/12/19 0537  AST 37 38 33 35 41  ALT 82* 80* 76* 90* 95*  ALKPHOS 72 80 78 64 66  BILITOT 0.9 1.0 0.9 0.8 0.7  PROT 6.4* 6.7 5.9* 5.7* 5.7*  ALBUMIN 2.4* 2.5* 2.2* 2.0* 1.9*   No results for input(s): LIPASE, AMYLASE in the last 168 hours. No results for input(s): AMMONIA in the last 168 hours.  ABG    Component Value Date/Time   PHART 7.347 (L) 12/11/2019 1120   PCO2ART 56.2 (H) 12/11/2019 1120   PO2ART 90.6 12/11/2019 1120   HCO3 30.0 (H) 12/11/2019 1120   ACIDBASEDEF 0.2 12/10/2019 0500   O2SAT 95.5 12/11/2019 1120     Coagulation Profile: Recent Labs  Lab 12/10/19 0352 12/12/19 0537    INR 1.4* 1.2    Cardiac Enzymes: No results for input(s): CKTOTAL, CKMB, CKMBINDEX, TROPONINI in the last 168 hours.  HbA1C: Hgb A1c MFr Bld  Date/Time Value Ref Range Status  11/25/2019 05:59 AM 5.9 (H) 4.8 - 5.6 % Final    Comment:    (NOTE) Pre diabetes:          5.7%-6.4%  Diabetes:              >6.4%  Glycemic control for   <7.0% adults with diabetes     CBG: Recent Labs  Lab 12/12/19 1624 12/12/19 1941 12/13/19 0005 12/13/19 0409 12/13/19 0841  GLUCAP 133* 148* 128* 116* 119*     Critical care time: 72 min     Kara Mead MD. FCCP. [facility] See Amion for pager  If no response to pager , please call [phone]  After 7:00 pm call Elink  [phone]   12/13/2019

## 2019-12-14 ENCOUNTER — Inpatient Hospital Stay (HOSPITAL_COMMUNITY): Payer: Medicare Other

## 2019-12-14 DIAGNOSIS — N179 Acute kidney failure, unspecified: Secondary | ICD-10-CM | POA: Diagnosis not present

## 2019-12-14 DIAGNOSIS — U071 COVID-19: Secondary | ICD-10-CM | POA: Diagnosis not present

## 2019-12-14 DIAGNOSIS — J80 Acute respiratory distress syndrome: Secondary | ICD-10-CM | POA: Diagnosis not present

## 2019-12-14 LAB — CBC WITH DIFFERENTIAL/PLATELET
Abs Immature Granulocytes: 0.46 10*3/uL — ABNORMAL HIGH (ref 0.00–0.07)
Basophils Absolute: 0 10*3/uL (ref 0.0–0.1)
Basophils Relative: 0 %
Eosinophils Absolute: 0.7 10*3/uL — ABNORMAL HIGH (ref 0.0–0.5)
Eosinophils Relative: 5 %
HCT: 28.9 % — ABNORMAL LOW (ref 39.0–52.0)
Hemoglobin: 8.4 g/dL — ABNORMAL LOW (ref 13.0–17.0)
Immature Granulocytes: 3 %
Lymphocytes Relative: 7 %
Lymphs Abs: 1 10*3/uL (ref 0.7–4.0)
MCH: 30.7 pg (ref 26.0–34.0)
MCHC: 29.1 g/dL — ABNORMAL LOW (ref 30.0–36.0)
MCV: 105.5 fL — ABNORMAL HIGH (ref 80.0–100.0)
Monocytes Absolute: 0.8 10*3/uL (ref 0.1–1.0)
Monocytes Relative: 6 %
Neutro Abs: 11.6 10*3/uL — ABNORMAL HIGH (ref 1.7–7.7)
Neutrophils Relative %: 79 %
Platelets: 139 10*3/uL — ABNORMAL LOW (ref 150–400)
RBC: 2.74 MIL/uL — ABNORMAL LOW (ref 4.22–5.81)
RDW: 13.9 % (ref 11.5–15.5)
WBC: 14.6 10*3/uL — ABNORMAL HIGH (ref 4.0–10.5)
nRBC: 0 % (ref 0.0–0.2)

## 2019-12-14 LAB — BASIC METABOLIC PANEL
Anion gap: 5 (ref 5–15)
BUN: 104 mg/dL — ABNORMAL HIGH (ref 8–23)
CO2: 27 mmol/L (ref 22–32)
Calcium: 7.1 mg/dL — ABNORMAL LOW (ref 8.9–10.3)
Chloride: 112 mmol/L — ABNORMAL HIGH (ref 98–111)
Creatinine, Ser: 2.92 mg/dL — ABNORMAL HIGH (ref 0.61–1.24)
GFR, Estimated: 21 mL/min — ABNORMAL LOW (ref >60)
Glucose, Bld: 124 mg/dL — ABNORMAL HIGH (ref 70–99)
Potassium: 5.1 mmol/L (ref 3.5–5.1)
Sodium: 144 mmol/L (ref 135–145)

## 2019-12-14 LAB — GLUCOSE, CAPILLARY
Glucose-Capillary: 122 mg/dL — ABNORMAL HIGH (ref 70–99)
Glucose-Capillary: 126 mg/dL — ABNORMAL HIGH (ref 70–99)
Glucose-Capillary: 120 mg/dL — ABNORMAL HIGH (ref 70–99)
Glucose-Capillary: 117 mg/dL — ABNORMAL HIGH (ref 70–99)

## 2019-12-14 LAB — PHOSPHORUS: Phosphorus: 3.3 mg/dL (ref 2.5–4.6)

## 2019-12-14 LAB — D-DIMER, QUANTITATIVE: D-Dimer, Quant: 2.93 ug/mL-FEU — ABNORMAL HIGH (ref 0.00–0.50)

## 2019-12-14 LAB — MAGNESIUM: Magnesium: 3.1 mg/dL — ABNORMAL HIGH (ref 1.7–2.4)

## 2019-12-14 MED ORDER — DEXMEDETOMIDINE HCL IN NACL 200 MCG/50ML IV SOLN
0.4000 ug/kg/h | INTRAVENOUS | Status: DC
  Administered 2019-12-14: 0.7 ug/kg/h via INTRAVENOUS
  Administered 2019-12-14: 0.6 ug/kg/h via INTRAVENOUS
  Administered 2019-12-14 (×2): 0.4 ug/kg/h via INTRAVENOUS
  Administered 2019-12-15: 1.1 ug/kg/h via INTRAVENOUS
  Administered 2019-12-15: 1 ug/kg/h via INTRAVENOUS
  Administered 2019-12-15: 1.1 ug/kg/h via INTRAVENOUS
  Filled 2019-12-14 (×7): qty 50

## 2019-12-14 MED ORDER — HEPARIN SODIUM (PORCINE) 5000 UNIT/ML IJ SOLN
5000.0000 [IU] | Freq: Three times a day (TID) | INTRAMUSCULAR | Status: DC
  Administered 2019-12-14 – 2019-12-15 (×3): 5000 [IU] via SUBCUTANEOUS
  Filled 2019-12-14 (×3): qty 1

## 2019-12-14 MED ORDER — SODIUM CHLORIDE 0.9 % IV SOLN
0.0000 mg/h | INTRAVENOUS | Status: DC
  Administered 2019-12-15: 3 mg/h via INTRAVENOUS
  Administered 2019-12-15: 6 mg/h via INTRAVENOUS
  Administered 2019-12-16: 1 mg/h via INTRAVENOUS
  Filled 2019-12-14 (×6): qty 10

## 2019-12-14 MED ORDER — DOCUSATE SODIUM 50 MG/5ML PO LIQD
100.0000 mg | Freq: Two times a day (BID) | ORAL | Status: DC
  Administered 2019-12-14 – 2019-12-30 (×26): 100 mg
  Filled 2019-12-14 (×27): qty 10

## 2019-12-14 NOTE — Progress Notes (Signed)
12/14/2019  Contacted Dr. Elsworth Soho and respiratory therapist, Rhett Bannister, via secure chat re. drop in SBP to upper 80's to 90. Slight decrease in O2 sats as well. Noted Precedex continues at its lowest (initial) rate of 0.4 mcg/kg/hr. Fentanyl was decreased to 125 mcg/hr after discussion with Earma Reading, RN. E-link Darcella Gasman RN contacted nurse and suggested switching vent from weaning mode to full support which was done. This resulted in immediate improvement in systolic BP to upper 54W. Slight increase in O2 sats to 91-92. Cindy S. Brigitte Pulse BSN, RN, [place] 12/14/2019 12:44 PM

## 2019-12-14 NOTE — Progress Notes (Signed)
12/14/2019 Increased Precedex drip to 0.5 mcg/kg/hr at 1614 due to patient more active and grimacing at times though without eye opening. At 1640, patient coughing associated with drop in O2 sats to mid-upper 80's. Moderate amount tan secretions obtained with suctioning. Sats remained low despite pre-oxygenation with 100% FiO2. Increased Precedex drip to 0.48mcg/kg/hr followed by a 2nd increase 19 minutes later due to continued low O2 sats and dysynchrony with ventilator. Discussed with e-Link and later RT Rhett Bannister who will assess patient. Dr. Elsworth Soho on unit and observed patient, ordered Versed 2mg  bolus to improve synchrony with vent and O2 saturations, as well as increase FiO2 to 60%. Vent change made by RN and RT aware. Cindy S. Brigitte Pulse BSN, RN, [place] 12/14/2019 6:09 PM

## 2019-12-14 NOTE — Progress Notes (Signed)
NAME:  Ronald Olsen, MRN:  [id number], DOB:  [birth year], LOS: 39 ADMISSION DATE:  11/19/2019, CONSULTATION DATE:  10/4 REFERRING MD:  Doreatha Martin, CHIEF COMPLAINT:  Respiratory failure in setting of COVID-19   Brief History   69 year old male patient admitted on 9/20 with Covid pneumonia. Treated with supplemental oxygen, systemic steroids, remdesivir and baricitinib. Progressive resp failure, intubated 10/5. Evolving ARF. Treating empirically for possible HCAP   Past Medical History  Hypertension, dyslipidemia. Coronary artery disease,  Significant Hospital Events   9/20:  CXR w/ bilateral infiltrates. + COVID-19, CRP 6.3, ddimer 1.67, PCT <0.10, BNP was neg.  He was started on high dose steroids, Remdesivirm and baricitinib.  9/21: 15 liters high flow.  9/22 CRP down 3.4 but still on high FIO2 9/25 completed day 5 of Remdesivir. Still on high dose steroids and Baricitinib  9/26 increased oxygen demand. Placed on NRB and high flow.  9/28 rapid desaturation w/ any movement  9/29 CT angio: negative for PE  9/30: no better  10/4 worsening rapid response called. PCCM asked to see w/ tranfers to ICU 10/5 intubated. Replaced w larger 8.0 ETT 10/5 for intermittent cuffleak. Epistaxis  10/6  prone positioning -FiO2 0.60, PEEP 10 ,Continued oozing from nare, even after packing.  Heparin held 10/8 FiO2 increased to 60% due to desaturation 10/9 nasal packing removed  Consults:  Pulmonary consulted 10/4 ENT 10/9  Procedures:  ETT 10/5 >>  PICC 10/5 >>   Significant Diagnostic Tests:  CT chest 9/29 showed multifocal groundglass attenuations bilaterally through all lobes Echocardiogram 10/5 >> Intact LV function 25-42%, grade 1 diastolic dysfunction, normal RV function and size 10/7 venous duplex negative  Micro Data:  9/29:SARS/coronavirus 2: Positive. 9/20 blood culture x2: Negative.  Antimicrobials:  Remdesivir 9/20 through 9/25 vanc 10/3 >> 10/6 Ctx 10/3>>10/4 Cefepime  10/4>>10/9 azith 10/3>> 10/6  Interim history/subjective:   Remains critically ill, intubated Afebrile No further blood being suctioned from airway per RN Good urine output  Objective   Blood pressure (!) 110/54, pulse 88, temperature 98.4 F (36.9 C), temperature source Axillary, resp. rate 17, height 5\' 11"  (1.803 m), weight 94.1 kg, SpO2 93 %. CVP:  [3 mmHg-7 mmHg] 7 mmHg  Vent Mode: PSV;CPAP FiO2 (%):  [50 %-60 %] 50 % Set Rate:  [35 bmp] 35 bmp Vt Set:  [450 mL] 450 mL PEEP:  [10 cmH20] 10 cmH20 Pressure Support:  [15 cmH20] 15 cmH20 Plateau Pressure:  [24 cmH20-29 cmH20] 27 cmH20   Intake/Output Summary (Last 24 hours) at 12/14/2019 1148 Last data filed at 12/14/2019 1100 Gross per 24 hour  Intake 2535.68 ml  Output 1725 ml  Net 810.68 ml   Filed Weights   12/09/19 0600 12/12/19 0500 12/14/19 0500  Weight: 88 kg 91.7 kg 94.1 kg    Examination: Gen:      elderly man, no distress , sedated, intubated HEENT:  EOMI, sclera anicteric, mild pallor, nasal packing with crusted blood around nose Neck:     No JVD; no thyromegaly Lungs:   Bilateral ventilated breath sounds no audible air leak today, no accessory muscle use CV:         Regular rate and rhythm; no murmurs Abd:      + bowel sounds; soft, non-tender; no palpable masses, no distension Ext:    No edema; adequate peripheral perfusion Skin:      Warm and dry; no rash Neuro: Sedated, RASS -4  Chest x-ray 10/10 independently reviewed, stable bilateral infiltrates, left upper extremity  PICC projects over azygous vein  Labs show slight decrease in sodium, slight increase in creatinine and BUN, mild leukocytosis, stable anemia and thrombocytopenia  Resolved Hospital Problem list    Mixed acidosis -Bicarb weaned to off 10/6 Severe leukocytosis, suspect steroid and stress  Assessment & Plan:  ARDS due to Covid pneumonia  -Completed baricitinib and remdesivir regimens -ET tube 10/5 with intermittent cuff leak,  changed to a 8.0 tube  10/5 Plan Continue low tidal volume ventilation per ARDS protocol Target TV 6-8cc/kgIBW Target Plateau Pressure < 30cm H20 , at goal Target driving pressure less than 15 cm of water , at goal Target PaO2 55-65: titrate PEEP/FiO2 per protocol No need for prone position since PF ratio has improved Check CVP daily if CVL in place Holding diuresis since BUN and creatinine climbing Start spontaneous breathing trials -tolerating pressure support 15/10 No paralytic here now, allow some asynchrony  Acute metabolic encephalopathy  -Wean off Versed, start Precedex, goal RASS -1 to -2 -Once completely off Versed can wean fentanyl to off  Shock, In the setting of sedating medications.  Presumed sepsis.  Completed course of cefepime echocardiogram 10/5 reassuring Norepi weaned to off 10/7, leukocytosis improving  Therapeutic enoxaparin and subsequently heparin gtt held due to epistaxis.  -venous duplex negative and echo did not show elevated RVSP, changed to prophylactic heparin   Acute renal failure, nonoliguric.  Creatinine continues to rise Follow BMP, UOP Renal dose medications   Epistaxis, suspect traumatic from NG tube attempt.  INR 1.4 Enoxaparin, aspirin, Plavix held. heparin gtt stopped on 10/6  -DC'd nasal packing 10/10,   Discussed with ENT, diffuse oozing on exam, using Afrin, no need for more packing  -Restart subcu heparin but hold Plavix for now -Slight drop in hemoglobin from 10.7-8.4, will monitor   Mild hyperglycemia. Suspect steroid-induced -Better controlled Plan Insulin ssi   Best practice:  Diet: Diet as tolerated Pain/Anxiety/Delirium protocol (if indicated): versed, fentanyl >> Precedex, goal RASS -1 VAP protocol (if indicated): Ordered DVT prophylaxis: Restart SQ heparin  GI prophylaxis: PPI Glucose control sliding scale insulin Mobility: Bedrest Code Status: DNR Family Communication:  wife updated daily Disposition:  ICU , can  consider discontinue isolation after 21 days 10/11  Labs   CBC: Recent Labs  Lab 12/10/19 0352 12/10/19 0352 12/10/19 2154 12/11/19 0307 12/12/19 0537 12/13/19 0400 12/14/19 0331  WBC 44.8*   < > 29.0* 25.7* 19.0* 13.6* 14.6*  NEUTROABS 40.9*  --   --  22.8* 16.8* 11.1* 11.6*  HGB 12.4*   < > 12.0* 11.3* 10.7* 9.0* 8.4*  HCT 37.8*   < > 37.2* 36.7* 34.3* 29.6* 28.9*  MCV 94.0   < > 96.9 99.7 100.3* 101.7* 105.5*  PLT 258   < > 190 176 149* 120* 139*   < > = values in this interval not displayed.    Basic Metabolic Panel: Recent Labs  Lab 12/08/19 0500 12/09/19 0327 12/10/19 2154 12/11/19 0307 12/12/19 0537 12/13/19 0400 12/14/19 0331  NA 131*   < > 144 145 145 147* 144  K 5.0   < > 5.2* 5.1 4.8 4.6 5.1  CL 97*   < > 104 105 109 113* 112*  CO2 25   < > 28 30 29 28 27   GLUCOSE 113*   < > 114* 113* 170* 116* 124*  BUN 53*   < > 82* 88* 100* 103* 104*  CREATININE 1.10   < > 2.71* 2.64* 2.24* 2.46* 2.92*  CALCIUM 8.7*   < >  7.6* 7.9* 7.9* 7.0* 7.1*  MG 2.4  --   --  3.1* 3.4* 3.2* 3.1*  PHOS 3.7  --   --  5.9* 3.5 2.6 3.3   < > = values in this interval not displayed.   GFR: Estimated Creatinine Clearance: 28.8 mL/min (A) (by C-G formula based on SCr of 2.92 mg/dL (H)). Recent Labs  Lab 12/11/19 0307 12/12/19 0537 12/13/19 0400 12/14/19 0331  WBC 25.7* 19.0* 13.6* 14.6*  LATICACIDVEN 1.0  --   --   --     Liver Function Tests: Recent Labs  Lab 12/08/19 0500 12/09/19 0327 12/10/19 0352 12/11/19 0307 12/12/19 0537  AST 37 38 33 35 41  ALT 82* 80* 76* 90* 95*  ALKPHOS 72 80 78 64 66  BILITOT 0.9 1.0 0.9 0.8 0.7  PROT 6.4* 6.7 5.9* 5.7* 5.7*  ALBUMIN 2.4* 2.5* 2.2* 2.0* 1.9*   No results for input(s): LIPASE, AMYLASE in the last 168 hours. No results for input(s): AMMONIA in the last 168 hours.  ABG    Component Value Date/Time   PHART 7.347 (L) 12/11/2019 1120   PCO2ART 56.2 (H) 12/11/2019 1120   PO2ART 90.6 12/11/2019 1120   HCO3 30.0 (H)  12/11/2019 1120   ACIDBASEDEF 0.2 12/10/2019 0500   O2SAT 95.5 12/11/2019 1120     Coagulation Profile: Recent Labs  Lab 12/10/19 0352 12/12/19 0537  INR 1.4* 1.2    Cardiac Enzymes: No results for input(s): CKTOTAL, CKMB, CKMBINDEX, TROPONINI in the last 168 hours.  HbA1C: Hgb A1c MFr Bld  Date/Time Value Ref Range Status  11/25/2019 05:59 AM 5.9 (H) 4.8 - 5.6 % Final    Comment:    (NOTE) Pre diabetes:          5.7%-6.4%  Diabetes:              >6.4%  Glycemic control for   <7.0% adults with diabetes     CBG: Recent Labs  Lab 12/13/19 1246 12/13/19 1614 12/13/19 2028 12/13/19 2249 12/14/19 0322  GLUCAP 123* 110* 113* 109* 122*     Critical care time: 32 min     Kara Mead MD. FCCP. [facility] See Amion for pager  If no response to pager , please call [phone]  After 7:00 pm call Elink  [phone]   12/14/2019

## 2019-12-15 DIAGNOSIS — R040 Epistaxis: Secondary | ICD-10-CM

## 2019-12-15 DIAGNOSIS — N179 Acute kidney failure, unspecified: Secondary | ICD-10-CM

## 2019-12-15 DIAGNOSIS — J9601 Acute respiratory failure with hypoxia: Secondary | ICD-10-CM | POA: Diagnosis not present

## 2019-12-15 DIAGNOSIS — U071 COVID-19: Secondary | ICD-10-CM | POA: Diagnosis not present

## 2019-12-15 LAB — CBC WITH DIFFERENTIAL/PLATELET
Abs Immature Granulocytes: 0.49 10*3/uL — ABNORMAL HIGH (ref 0.00–0.07)
Basophils Absolute: 0 10*3/uL (ref 0.0–0.1)
Basophils Relative: 0 %
Eosinophils Absolute: 0.7 10*3/uL — ABNORMAL HIGH (ref 0.0–0.5)
Eosinophils Relative: 5 %
HCT: 25.8 % — ABNORMAL LOW (ref 39.0–52.0)
Hemoglobin: 7.8 g/dL — ABNORMAL LOW (ref 13.0–17.0)
Immature Granulocytes: 4 %
Lymphocytes Relative: 8 %
Lymphs Abs: 1.1 10*3/uL (ref 0.7–4.0)
MCH: 31.2 pg (ref 26.0–34.0)
MCHC: 30.2 g/dL (ref 30.0–36.0)
MCV: 103.2 fL — ABNORMAL HIGH (ref 80.0–100.0)
Monocytes Absolute: 0.7 10*3/uL (ref 0.1–1.0)
Monocytes Relative: 5 %
Neutro Abs: 10.7 10*3/uL — ABNORMAL HIGH (ref 1.7–7.7)
Neutrophils Relative %: 78 %
Platelets: 142 10*3/uL — ABNORMAL LOW (ref 150–400)
RBC: 2.5 MIL/uL — ABNORMAL LOW (ref 4.22–5.81)
RDW: 13.3 % (ref 11.5–15.5)
WBC: 13.6 10*3/uL — ABNORMAL HIGH (ref 4.0–10.5)
nRBC: 0 % (ref 0.0–0.2)

## 2019-12-15 LAB — BLOOD GAS, ARTERIAL
Acid-base deficit: 0.2 mmol/L (ref 0.0–2.0)
Bicarbonate: 26.9 mmol/L (ref 20.0–28.0)
FIO2: 60
MECHVT: 450 mL
O2 Saturation: 88.2 %
PEEP: 10 cmH2O
Patient temperature: 98.6
RATE: 35 resp/min
pCO2 arterial: 58.6 mmHg — ABNORMAL HIGH (ref 32.0–48.0)
pH, Arterial: 7.285 — ABNORMAL LOW (ref 7.350–7.450)
pO2, Arterial: 61.3 mmHg — ABNORMAL LOW (ref 83.0–108.0)

## 2019-12-15 LAB — BASIC METABOLIC PANEL
Anion gap: 6 (ref 5–15)
BUN: 105 mg/dL — ABNORMAL HIGH (ref 8–23)
CO2: 25 mmol/L (ref 22–32)
Calcium: 7.1 mg/dL — ABNORMAL LOW (ref 8.9–10.3)
Chloride: 111 mmol/L (ref 98–111)
Creatinine, Ser: 2.75 mg/dL — ABNORMAL HIGH (ref 0.61–1.24)
GFR, Estimated: 23 mL/min — ABNORMAL LOW (ref >60)
Glucose, Bld: 112 mg/dL — ABNORMAL HIGH (ref 70–99)
Potassium: 4.8 mmol/L (ref 3.5–5.1)
Sodium: 142 mmol/L (ref 135–145)

## 2019-12-15 LAB — GLUCOSE, CAPILLARY
Glucose-Capillary: 115 mg/dL — ABNORMAL HIGH (ref 70–99)
Glucose-Capillary: 123 mg/dL — ABNORMAL HIGH (ref 70–99)
Glucose-Capillary: 123 mg/dL — ABNORMAL HIGH (ref 70–99)
Glucose-Capillary: 123 mg/dL — ABNORMAL HIGH (ref 70–99)
Glucose-Capillary: 137 mg/dL — ABNORMAL HIGH (ref 70–99)
Glucose-Capillary: 148 mg/dL — ABNORMAL HIGH (ref 70–99)
Glucose-Capillary: 172 mg/dL — ABNORMAL HIGH (ref 70–99)

## 2019-12-15 LAB — PHOSPHORUS: Phosphorus: 2.8 mg/dL (ref 2.5–4.6)

## 2019-12-15 LAB — D-DIMER, QUANTITATIVE: D-Dimer, Quant: 3.57 ug/mL-FEU — ABNORMAL HIGH (ref 0.00–0.50)

## 2019-12-15 LAB — MAGNESIUM: Magnesium: 2.8 mg/dL — ABNORMAL HIGH (ref 1.7–2.4)

## 2019-12-15 MED ORDER — INSULIN ASPART 100 UNIT/ML ~~LOC~~ SOLN
0.0000 [IU] | SUBCUTANEOUS | Status: DC
  Administered 2019-12-15 (×2): 1 [IU] via SUBCUTANEOUS
  Administered 2019-12-15: 2 [IU] via SUBCUTANEOUS
  Administered 2019-12-15 – 2019-12-17 (×7): 1 [IU] via SUBCUTANEOUS
  Administered 2019-12-18: 2 [IU] via SUBCUTANEOUS
  Administered 2019-12-18 – 2019-12-19 (×7): 1 [IU] via SUBCUTANEOUS
  Administered 2019-12-19: 2 [IU] via SUBCUTANEOUS
  Administered 2019-12-19 – 2019-12-21 (×10): 1 [IU] via SUBCUTANEOUS
  Administered 2019-12-21: 2 [IU] via SUBCUTANEOUS
  Administered 2019-12-21 – 2019-12-25 (×16): 1 [IU] via SUBCUTANEOUS
  Administered 2019-12-26: 2 [IU] via SUBCUTANEOUS
  Administered 2019-12-26 – 2019-12-27 (×7): 1 [IU] via SUBCUTANEOUS

## 2019-12-15 MED ORDER — OXYMETAZOLINE HCL 0.05 % NA SOLN
1.0000 | Freq: Two times a day (BID) | NASAL | Status: DC | PRN
  Administered 2019-12-15 – 2019-12-17 (×2): 1 via NASAL
  Filled 2019-12-15: qty 30

## 2019-12-15 MED ORDER — ARTIFICIAL TEARS OPHTHALMIC OINT
1.0000 "application " | TOPICAL_OINTMENT | Freq: Three times a day (TID) | OPHTHALMIC | Status: DC
  Administered 2019-12-15 – 2019-12-18 (×10): 1 via OPHTHALMIC
  Filled 2019-12-15: qty 3.5

## 2019-12-15 MED ORDER — VECURONIUM BROMIDE 10 MG IV SOLR
0.1000 mg/kg | INTRAVENOUS | Status: DC | PRN
  Administered 2019-12-15: 9.5 mg via INTRAVENOUS
  Filled 2019-12-15: qty 10

## 2019-12-15 MED ORDER — PHENYLEPHRINE HCL-NACL 10-0.9 MG/250ML-% IV SOLN
0.0000 ug/min | INTRAVENOUS | Status: DC
  Administered 2019-12-15: 70 ug/min via INTRAVENOUS
  Administered 2019-12-15: 20 ug/min via INTRAVENOUS
  Filled 2019-12-15 (×2): qty 250

## 2019-12-15 MED ORDER — OXYMETAZOLINE HCL 0.05 % NA SOLN
1.0000 | Freq: Two times a day (BID) | NASAL | Status: DC | PRN
  Filled 2019-12-15: qty 15

## 2019-12-15 NOTE — Progress Notes (Signed)
Patient continuously asynchronous with ventilator throughout shift. RN increased sedation per protocol and patient still noncompliant with ventilator. Blood pressures gradually dropping with increased sedation. Contacted [facility] to get further orders and instructions.

## 2019-12-15 NOTE — Progress Notes (Signed)
Assisted tele visit to patient with wife.  Ronald Olsen P, RN  

## 2019-12-15 NOTE — TOC Initial Note (Signed)
Transition of Care [facility]) - Initial/Assessment Note    Patient Details  Name: Ronald Olsen MRN: [id number] Date of Birth: [birth year]  Transition of Care [facility]) CM/SW Contact:    Leeroy Cha, RN Phone Number: 12/15/2019, 7:29 AM  Clinical Narrative:                 9/20:  CXR w/ bilateral infiltrates. + COVID-19, CRP 6.3, ddimer 1.67, PCT <0.10, BNP was neg.  He was started on high dose steroids, Remdesivirm and baricitinib.  9/21: 15 liters high flow.  9/22 CRP down 3.4 but still on high FIO2 9/25 completed day 5 of Remdesivir. Still on high dose steroids and Baricitinib  9/26 increased oxygen demand. Placed on NRB and high flow.  9/28 rapid desaturation w/ any movement  9/29 CT angio: negative for PE  9/30: no better  10/4 worsening rapid response called. PCCM asked to see w/ tranfers to ICU 10/5 intubated. Replaced w larger 8.0 ETT 10/5 for intermittent cuffleak. Epistaxis  10/6  prone positioning -FiO2 0.60, PEEP 10 ,Continued oozing from nare, even after packing.  Heparin held 10/8 FiO2 increased to 60% due to desaturation 10/9 nasal packing removed 10/11-on vent with iv sedation, a.lines, iv neo, bun105 creat 2.75, d. Dimer 3.57, wbc-13.6, hgb 7.8 Following for progression and future toc needs, no family in room. Expected Discharge Plan: [place] Barriers to Discharge: Barriers Unresolved (comment)   Patient Goals and CMS Choice Patient states their goals for this hospitalization and ongoing recovery are:: unable to state      Expected Discharge Plan and Services Expected Discharge Plan: [place]   Discharge Planning Services: CM Consult   Living arrangements for the past 2 months: Single Family Home                                      Prior Living Arrangements/Services Living arrangements for the past 2 months: Single Family Home Lives with:: Spouse          Need for Family Participation in Patient  Care: Yes (Comment) Care giver support system in place?: Yes (comment)   Criminal Activity/Legal Involvement Pertinent to Current Situation/Hospitalization: No - Comment as needed  Activities of Daily Living Home Assistive Devices/Equipment: Eyeglasses ADL Screening (condition at time of admission) Patient's cognitive ability adequate to safely complete daily activities?: Yes Is the patient deaf or have difficulty hearing?: No Does the patient have difficulty seeing, even when wearing glasses/contacts?: No Does the patient have difficulty concentrating, remembering, or making decisions?: No Patient able to express need for assistance with ADLs?: Yes Does the patient have difficulty dressing or bathing?: Yes Independently performs ADLs?: No (increasing weakness x 1 week) Communication: Independent Dressing (OT): Independent Grooming: Independent Feeding: Independent Bathing: Independent Toileting: Needs assistance Is this a change from baseline?: Change from baseline, expected to last >3days In/Out Bed: Needs assistance Is this a change from baseline?: Change from baseline, expected to last >3 days Walks in Home: Needs assistance Is this a change from baseline?: Change from baseline, expected to last >3 days Does the patient have difficulty walking or climbing stairs?: Yes (secondary to shortness of breath and weakness) Weakness of Legs: Both Weakness of Arms/Hands: None  Permission Sought/Granted                  Emotional Assessment Appearance:: Appears stated age Attitude/Demeanor/Rapport: Unable  to Assess Affect (typically observed): Unable to Assess Orientation: : Fluctuating Orientation (Suspected and/or reported Sundowners) Alcohol / Substance Use: Not Applicable Psych Involvement: No (comment)  Admission diagnosis:  Hypoxia [R09.02] Acute hypoxemic respiratory failure due to COVID-19 ([place]) [U07.1, J96.01] COVID-19 [U07.1] Patient Active Problem List    Diagnosis Date Noted  . Pressure injury of skin 12/13/2019  . Oral bleeding   . ARDS (adult respiratory distress syndrome) ([place])   . COVID-19 11/09/2019  . CAD (coronary artery disease) 11/07/2019  . Essential hypertension 11/18/2019  . Hyponatremia 12/02/2019  . Hyperlipidemia 11/12/2019   PCP:  Merryl Hacker, No Pharmacy:   [facility] [address], [place] [address] Phone: [phone] Fax: [phone]     Social Determinants of Health (SDOH) Interventions    Readmission Risk Interventions No flowsheet data found.

## 2019-12-15 NOTE — Progress Notes (Signed)
[facility] Progress Note Patient Name: Ronald Olsen DOB: [birth year] MRN: [id number]   Date of Service  12/15/2019  HPI/Events of Note  Hypotension - BP = 99/50 with MAP = 64.   eICU Interventions  Plan: 1. Phenylephrine IV infusion. Titrate to MAP >= 65.      Intervention Category Major Interventions: Hypotension - evaluation and management  Cammy Sanjurjo [place] 12/15/2019, 12:34 AM

## 2019-12-15 NOTE — Consult Note (Signed)
WOC Nurse Consult Note: Reason for Consult:sacral deep tissue injury Wound type: deep tissue injury Pressure Injury POA: No Measurement: 6 cm x 5 cm intact maroon discoloration  Wound ZOX:WRUEAV skin Drainage (amount, consistency, odor) none Periwound: intact Dressing procedure/placement/frequency: cleanse sacral wound with NS and pat dry. Apply sacral foam.  Change every three days and PRn soilage.  Turn and reposition every two hours Will assess weekly.  Domenic Moras MSN, RN, FNP-BC CWON Wound, Ostomy, Continence Nurse Pager [phone]

## 2019-12-15 NOTE — Progress Notes (Signed)
proned 1330

## 2019-12-15 NOTE — Progress Notes (Addendum)
NAME:  Ronald Olsen, MRN:  [id number], DOB:  02-Apr-1950, LOS: 21 ADMISSION DATE:  11/27/2019, CONSULTATION DATE:  10/4 REFERRING MD:  Doreatha Martin, CHIEF COMPLAINT:  Respiratory failure in setting of COVID-19   Brief History   69 year old male patient admitted on 9/20 with Covid pneumonia. Treated with supplemental oxygen, systemic steroids, remdesivir and baricitinib. Progressive resp failure, intubated 10/5. Evolving ARF. Treating empirically for possible HCAP.  Past Medical History  Hypertension, dyslipidemia. Coronary artery disease,  Significant Hospital Events   9/20  Admit with CXR w/ bilateral infiltrates, COVID positive, PCT <0.10, BNP was neg. Started on high dose steroids, Remdesivir and baricitinib.  9/21 HFNC 15 liters    9/22 CRP down 3.4 but still on high FIO2 9/25 Completed day 5 of Remdesivir. Still on high dose steroids and Baricitinib  9/26 Increased oxygen demand. Placed on NRB and high flow.  9/28 Rapid desaturation w/ any movement  9/29 CT angio negative for PE  10/4 Worsening resp failure, rapid response called. PCCM asked to see w/ tranfers to ICU 10/5 Intubated. Replaced w larger 8.0 ETT 10/5 for intermittent cuffleak. Epistaxis  10/6  Prone positioning -FiO2 0.60, PEEP 10 ,Continued oozing from nare, even after packing.  Heparin held 10/8 FiO2 increased to 60% due to desaturation 10/9 Nasal packing removed 10/11 Ongoing oozing from right nares, draining into mouth, vent dyssynchrony.  Heparin SQ held. P/F 87, prone  Consults:  Pulmonary consulted 10/4 ENT 10/9  Procedures:  ETT 10/5 >>  LUE PICC 10/5 >>  R Femoral ALine 10/6 >>  Significant Diagnostic Tests:  9/29 CT Chest 9/29 >> multifocal groundglass attenuations bilaterally through all lobes 10/5 ECHO >> Intact LV function 62-95%, grade 1 diastolic dysfunction, normal RV function and size 10/7 LE Venous duplex >> negative  Micro Data:  COVID 9/29 >> Positive BCx2 9/20 >>  Negative  Antimicrobials:  Remdesivir 9/20 >> 9/25 Vanc 10/3 >> 10/6 Ceftriaxone 10/3 >> 10/4 Cefepime 10/4 >> 10/9 Azitho 10/3 >> 10/6  Interim history/subjective:  RN reports ongoing bloody secretions from nose, mouth Afebrile  Vent - 70%, PEEP 10, Peak ~30 (dyssynchronous), Pplat ~28, Driving pressure 18 Glucose range 109 -148 I/O 2L UOP, 1.1L+ in 24 hours   Objective   Blood pressure 130/69, pulse 100, temperature 98.6 F (37 C), temperature source Axillary, resp. rate (!) 34, height 5\' 11"  (1.803 m), weight 95.3 kg, SpO2 91 %. CVP:  [7 mmHg-9 mmHg] 9 mmHg  Vent Mode: PRVC FiO2 (%):  [50 %-70 %] 70 % Set Rate:  [35 bmp] 35 bmp Vt Set:  [450 mL] 450 mL PEEP:  [10 cmH20] 10 cmH20 Pressure Support:  [15 cmH20] 15 cmH20 Plateau Pressure:  [14 cmH20-17 cmH20] 14 cmH20   Intake/Output Summary (Last 24 hours) at 12/15/2019 0840 Last data filed at 12/15/2019 0700 Gross per 24 hour  Intake 3006.72 ml  Output 2050 ml  Net 956.72 ml   Filed Weights   12/12/19 0500 12/14/19 0500 12/15/19 0500  Weight: 91.7 kg 94.1 kg 95.3 kg    Examination: General: critically ill appearing adult male lying in bed in NAD on vent HEENT: MM pink/moist, ETT, right nares with blood drainage, bloody drainage from mouth (suspect from nose / no evidence of oral bleeding), pupils 52mm sluggish, mild scleral edema  Neuro: sedate/paralyzed  CV: s1s2 RRR, no m/r/g PULM: dyssynchronous on vent, lungs bilaterally clear anterior GI: soft, bsx4 active  Extremities: warm/dry, trace dependent edema  Skin: no rashes or lesions   Resolved  Hospital Problem list   Mixed acidosis - Bicarb weaned to off 10/6 Severe leukocytosis, suspect steroid and stress  Assessment & Plan:   ARDS secondary to COVID PNA Completed remdesivir, baricitinib, steroids.  ETT with cuff leak 10/5, changed.  -low Vt ventilation 4-8cc/kg -goal plateau pressure <30, driving pressure <47 cm H2O -target PaO2 55-65, titrate PEEP/FiO2  per ARDS protocol  -assess ABG now to review PF ratio -P/F ratio <150, plan for prone therapy for 16 hours per day -goal CVP <4, diuresis as necessary >hold 10/11  -VAP prevention measures  -follow intermittent CXR   Acute Metabolic Encephalopathy  Sedation Needs while on Vent -continue versed, fentanyl gtt's -RASS Goal -2 to -3 with vent synchrony -PRN paralytics added due to dyssynchrony   Shock In the setting of sedating medications + presumed sepsis.  Completed course of cefepime, echocardiogram 10/5 reassuring. Norepi weaned to off 10/7 -monitor hemodynamic trends in ICU  AKI, Non-Oliguric  -Trend BMP / urinary output -Replace electrolytes as indicated -Avoid nephrotoxic agents, ensure adequate renal perfusion -renal dose medications  Epistaxis Suspect traumatic from NG tube attempt.  INR 1.4.  LE duplex negative, ECHO negative.   Enoxaparin, aspirin, Plavix held. heparin gtt stopped on 10/6  -hold SQ heparin 10/11, consider restart in am 10/12 pending review -monitor Hgb trend, noted downtrend -SCD's  -hold Plavix  -continue Afrin, reviewed with ENT over weekend with rec's for Afrin and no further packing  Hyperglycemia Mild, suspect steroid-induced -change to Q4 SSI    Best practice:  Diet: NPO, TF  Pain/Anxiety/Delirium protocol (if indicated): PAD protocol  VAP protocol (if indicated): Ordered DVT prophylaxis: SCD's, see above  GI prophylaxis: PPI Glucose control: SSI    Mobility: Bedrest Code Status: DNR Family Communication: Wife updated 10/11 via phone. Disposition: ICU   Labs    CBC: Recent Labs  Lab 12/11/19 0307 12/12/19 0537 12/13/19 0400 12/14/19 0331 12/15/19 0415  WBC 25.7* 19.0* 13.6* 14.6* 13.6*  NEUTROABS 22.8* 16.8* 11.1* 11.6* 10.7*  HGB 11.3* 10.7* 9.0* 8.4* 7.8*  HCT 36.7* 34.3* 29.6* 28.9* 25.8*  MCV 99.7 100.3* 101.7* 105.5* 103.2*  PLT 176 149* 120* 139* 142*    Basic Metabolic Panel: Recent Labs  Lab 12/11/19 0307  12/12/19 0537 12/13/19 0400 12/14/19 0331 12/15/19 0415  NA 145 145 147* 144 142  K 5.1 4.8 4.6 5.1 4.8  CL 105 109 113* 112* 111  CO2 30 29 28 27 25   GLUCOSE 113* 170* 116* 124* 112*  BUN 88* 100* 103* 104* 105*  CREATININE 2.64* 2.24* 2.46* 2.92* 2.75*  CALCIUM 7.9* 7.9* 7.0* 7.1* 7.1*  MG 3.1* 3.4* 3.2* 3.1* 2.8*  PHOS 5.9* 3.5 2.6 3.3 2.8   GFR: Estimated Creatinine Clearance: 30.7 mL/min (A) (by C-G formula based on SCr of 2.75 mg/dL (H)). Recent Labs  Lab 12/11/19 0307 12/11/19 0307 12/12/19 0537 12/13/19 0400 12/14/19 0331 12/15/19 0415  WBC 25.7*   < > 19.0* 13.6* 14.6* 13.6*  LATICACIDVEN 1.0  --   --   --   --   --    < > = values in this interval not displayed.    Liver Function Tests: Recent Labs  Lab 12/09/19 0327 12/10/19 0352 12/11/19 0307 12/12/19 0537  AST 38 33 35 41  ALT 80* 76* 90* 95*  ALKPHOS 80 78 64 66  BILITOT 1.0 0.9 0.8 0.7  PROT 6.7 5.9* 5.7* 5.7*  ALBUMIN 2.5* 2.2* 2.0* 1.9*   No results for input(s): LIPASE, AMYLASE in the  last 168 hours. No results for input(s): AMMONIA in the last 168 hours.  ABG    Component Value Date/Time   PHART 7.347 (L) 12/11/2019 1120   PCO2ART 56.2 (H) 12/11/2019 1120   PO2ART 90.6 12/11/2019 1120   HCO3 30.0 (H) 12/11/2019 1120   ACIDBASEDEF 0.2 12/10/2019 0500   O2SAT 95.5 12/11/2019 1120     Coagulation Profile: Recent Labs  Lab 12/10/19 0352 12/12/19 0537  INR 1.4* 1.2    Cardiac Enzymes: No results for input(s): CKTOTAL, CKMB, CKMBINDEX, TROPONINI in the last 168 hours.  HbA1C: Hgb A1c MFr Bld  Date/Time Value Ref Range Status  11/25/2019 05:59 AM 5.9 (H) 4.8 - 5.6 % Final    Comment:    (NOTE) Pre diabetes:          5.7%-6.4%  Diabetes:              >6.4%  Glycemic control for   <7.0% adults with diabetes     CBG: Recent Labs  Lab 12/14/19 1558 12/14/19 2003 12/14/19 2330 12/15/19 0416 12/15/19 0802  GLUCAP 117* 123* 123* 115* 123*     Critical care time: 40  minutes    Noe Gens, MSN, NP-C, AGACNP-BC [facility] 12/15/2019, 8:40 AM   Please see [email or web address] for pager details.

## 2019-12-16 ENCOUNTER — Inpatient Hospital Stay (HOSPITAL_COMMUNITY): Payer: Medicare Other

## 2019-12-16 DIAGNOSIS — U071 COVID-19: Secondary | ICD-10-CM | POA: Diagnosis not present

## 2019-12-16 DIAGNOSIS — J9601 Acute respiratory failure with hypoxia: Secondary | ICD-10-CM | POA: Diagnosis not present

## 2019-12-16 LAB — CBC WITH DIFFERENTIAL/PLATELET
Abs Immature Granulocytes: 0.62 10*3/uL — ABNORMAL HIGH (ref 0.00–0.07)
Basophils Absolute: 0.1 10*3/uL (ref 0.0–0.1)
Basophils Relative: 0 %
Eosinophils Absolute: 0 10*3/uL (ref 0.0–0.5)
Eosinophils Relative: 0 %
HCT: 30.6 % — ABNORMAL LOW (ref 39.0–52.0)
Hemoglobin: 9.1 g/dL — ABNORMAL LOW (ref 13.0–17.0)
Immature Granulocytes: 3 %
Lymphocytes Relative: 5 %
Lymphs Abs: 1 10*3/uL (ref 0.7–4.0)
MCH: 31.2 pg (ref 26.0–34.0)
MCHC: 29.7 g/dL — ABNORMAL LOW (ref 30.0–36.0)
MCV: 104.8 fL — ABNORMAL HIGH (ref 80.0–100.0)
Monocytes Absolute: 1.1 10*3/uL — ABNORMAL HIGH (ref 0.1–1.0)
Monocytes Relative: 5 %
Neutro Abs: 17.9 10*3/uL — ABNORMAL HIGH (ref 1.7–7.7)
Neutrophils Relative %: 87 %
Platelets: 185 10*3/uL (ref 150–400)
RBC: 2.92 MIL/uL — ABNORMAL LOW (ref 4.22–5.81)
RDW: 13.1 % (ref 11.5–15.5)
WBC: 20.7 10*3/uL — ABNORMAL HIGH (ref 4.0–10.5)
nRBC: 0 % (ref 0.0–0.2)

## 2019-12-16 LAB — GLUCOSE, CAPILLARY
Glucose-Capillary: 126 mg/dL — ABNORMAL HIGH (ref 70–99)
Glucose-Capillary: 120 mg/dL — ABNORMAL HIGH (ref 70–99)
Glucose-Capillary: 104 mg/dL — ABNORMAL HIGH (ref 70–99)

## 2019-12-16 LAB — D-DIMER, QUANTITATIVE: D-Dimer, Quant: 3.88 ug/mL-FEU — ABNORMAL HIGH (ref 0.00–0.50)

## 2019-12-16 LAB — BASIC METABOLIC PANEL
Anion gap: 8 (ref 5–15)
Anion gap: 10 (ref 5–15)
BUN: 94 mg/dL — ABNORMAL HIGH (ref 8–23)
BUN: 130 mg/dL — ABNORMAL HIGH (ref 8–23)
CO2: 26 mmol/L (ref 22–32)
CO2: 27 mmol/L (ref 22–32)
Calcium: 7.8 mg/dL — ABNORMAL LOW (ref 8.9–10.3)
Calcium: 7.8 mg/dL — ABNORMAL LOW (ref 8.9–10.3)
Chloride: 107 mmol/L (ref 98–111)
Chloride: 105 mmol/L (ref 98–111)
Creatinine, Ser: 3.57 mg/dL — ABNORMAL HIGH (ref 0.61–1.24)
Creatinine, Ser: 4 mg/dL — ABNORMAL HIGH (ref 0.61–1.24)
GFR, Estimated: 17 mL/min — ABNORMAL LOW (ref >60)
GFR, Estimated: 15 mL/min — ABNORMAL LOW (ref >60)
Glucose, Bld: 130 mg/dL — ABNORMAL HIGH (ref 70–99)
Glucose, Bld: 126 mg/dL — ABNORMAL HIGH (ref 70–99)
Potassium: 7 mmol/L (ref 3.5–5.1)
Potassium: 5.4 mmol/L — ABNORMAL HIGH (ref 3.5–5.1)
Sodium: 141 mmol/L (ref 135–145)
Sodium: 142 mmol/L (ref 135–145)

## 2019-12-16 LAB — BPAM FFP
Blood Product Expiration Date: 202110112359
Blood Product Expiration Date: 202110112359
Unit Type and Rh: 600
Unit Type and Rh: 600

## 2019-12-16 LAB — BLOOD GAS, ARTERIAL
Acid-Base Excess: 0.8 mmol/L (ref 0.0–2.0)
Bicarbonate: 28.5 mmol/L — ABNORMAL HIGH (ref 20.0–28.0)
FIO2: 60
MECHVT: 450 mL
O2 Saturation: 98.3 %
Patient temperature: 98.6
RATE: 35 resp/min
pCO2 arterial: 70.4 mmHg (ref 32.0–48.0)
pH, Arterial: 7.232 — ABNORMAL LOW (ref 7.350–7.450)
pO2, Arterial: 120 mmHg — ABNORMAL HIGH (ref 83.0–108.0)

## 2019-12-16 LAB — PREPARE FRESH FROZEN PLASMA
Unit division: 0
Unit division: 0

## 2019-12-16 LAB — CREATININE, URINE, RANDOM: Creatinine, Urine: 111.26 mg/dL

## 2019-12-16 LAB — VITAMIN B12: Vitamin B-12: 964 pg/mL — ABNORMAL HIGH (ref 180–914)

## 2019-12-16 LAB — SODIUM, URINE, RANDOM: Sodium, Ur: 46 mmol/L

## 2019-12-16 MED ORDER — SODIUM POLYSTYRENE SULFONATE 15 GM/60ML PO SUSP
15.0000 g | Freq: Once | ORAL | Status: AC
  Administered 2019-12-16: 15 g
  Filled 2019-12-16: qty 60

## 2019-12-16 MED ORDER — INSULIN ASPART 100 UNIT/ML IV SOLN
10.0000 [IU] | Freq: Once | INTRAVENOUS | Status: AC
  Administered 2019-12-16: 10 [IU] via INTRAVENOUS

## 2019-12-16 MED ORDER — NEPRO/CARBSTEADY PO LIQD
1000.0000 mL | ORAL | Status: DC
  Administered 2019-12-16 – 2019-12-22 (×7): 1000 mL
  Filled 2019-12-16 (×11): qty 1000

## 2019-12-16 MED ORDER — SODIUM ZIRCONIUM CYCLOSILICATE 10 G PO PACK
10.0000 g | PACK | Freq: Once | ORAL | Status: AC
  Administered 2019-12-16: 10 g
  Filled 2019-12-16 (×2): qty 1

## 2019-12-16 MED ORDER — DEXTROSE 50 % IV SOLN
1.0000 | Freq: Once | INTRAVENOUS | Status: AC
  Administered 2019-12-16: 50 mL via INTRAVENOUS
  Filled 2019-12-16: qty 50

## 2019-12-16 MED ORDER — SODIUM ZIRCONIUM CYCLOSILICATE 10 G PO PACK
10.0000 g | PACK | Freq: Every day | ORAL | Status: DC
  Administered 2019-12-17: 10 g
  Filled 2019-12-16: qty 1

## 2019-12-16 MED ORDER — HEPARIN SODIUM (PORCINE) 5000 UNIT/ML IJ SOLN
5000.0000 [IU] | Freq: Three times a day (TID) | INTRAMUSCULAR | Status: DC
  Administered 2019-12-16 – 2019-12-30 (×41): 5000 [IU] via SUBCUTANEOUS
  Filled 2019-12-16 (×35): qty 1

## 2019-12-16 MED ORDER — SODIUM BICARBONATE 8.4 % IV SOLN
100.0000 meq | Freq: Once | INTRAVENOUS | Status: AC
  Administered 2019-12-16: 100 meq via INTRAVENOUS
  Filled 2019-12-16: qty 100

## 2019-12-16 MED ORDER — CALCIUM GLUCONATE-NACL 1-0.675 GM/50ML-% IV SOLN
1.0000 g | Freq: Once | INTRAVENOUS | Status: AC
  Administered 2019-12-16: 1000 mg via INTRAVENOUS
  Filled 2019-12-16: qty 50

## 2019-12-16 MED ORDER — PROSOURCE TF PO LIQD
90.0000 mL | Freq: Two times a day (BID) | ORAL | Status: DC
  Administered 2019-12-16 – 2019-12-22 (×12): 90 mL
  Filled 2019-12-16 (×12): qty 90

## 2019-12-16 NOTE — Progress Notes (Addendum)
NAME:  Ronald Olsen, MRN:  [id number], DOB:  1950/03/12, LOS: 41 ADMISSION DATE:  11/09/2019, CONSULTATION DATE:  10/4 REFERRING MD:  Doreatha Martin, CHIEF COMPLAINT:  Respiratory failure in setting of COVID-19   Brief History   69 year old male patient admitted on 9/20 with Covid pneumonia. Treated with supplemental oxygen, systemic steroids, remdesivir and baricitinib. Progressive resp failure, intubated 10/5. Evolving ARF. Treating empirically for possible HCAP.  Past Medical History  Hypertension, dyslipidemia. Coronary artery disease  Significant Hospital Events   9/20  Admit, COVID positive, PCT <0.10, BNP neg. High dose steroids, Remdesivir and baricitinib.  9/21 HFNC 15 liters    9/22 CRP down 3.4 but still on high FIO2 9/25 Completed day 5 of Remdesivir. Still on high dose steroids and Baricitinib  9/26 Increased oxygen demand. Placed on NRB and high flow.  9/28 Rapid desaturation w/ any movement  9/29 CT angio negative for PE  10/4 Worsening resp failure, rapid response called. PCCM asked to see w/ tranfers to ICU 10/5 Intubated. Replaced w larger 8.0 ETT 10/5 for intermittent cuffleak. Epistaxis  10/6  Prone positioning -FiO2 0.60, PEEP 10 ,Continued oozing from nare, even after packing.  Heparin held 10/8 FiO2 increased to 60% due to desaturation 10/9 Nasal packing removed 10/11 Ongoing oozing from right nares, draining into mouth, vent dyssynchrony.  Heparin SQ held. P/F 87, prone  Consults:  Pulmonary consulted 10/4 ENT 10/9  Procedures:  ETT 10/5 >>  LUE PICC 10/5 >>  R Femoral ALine 10/6 >>  Significant Diagnostic Tests:  9/29 CT Chest 9/29 >> multifocal groundglass attenuations bilaterally through all lobes 10/5 ECHO >> Intact LV function 82-64%, grade 1 diastolic dysfunction, normal RV function and size 10/7 LE Venous duplex >> negative  Micro Data:  COVID 9/29 >> Positive BCx2 9/20 >> Negative  Antimicrobials:  Remdesivir 9/20 >> 9/25 Vanc 10/3 >>  10/6 Ceftriaxone 10/3 >> 10/4 Cefepime 10/4 >> 10/9 Azitho 10/3 >> 10/6  Interim history/subjective:  Afebrile  RN reports hyperkalemia this am, treated per [facility] I/O 1.2L UOP, -634 in last 24 hours  Vent - 60%, PEEP 10, ABG post prone with PF ratio of 200 Glucose range 120-148   Objective   Blood pressure (!) 130/52, pulse 79, temperature (!) 96.7 F (35.9 C), temperature source Axillary, resp. rate (!) 35, height 5\' 11"  (1.803 m), weight 97 kg, SpO2 100 %. CVP:  [9 mmHg-10 mmHg] 10 mmHg  Vent Mode: PRVC FiO2 (%):  [60 %-70 %] 60 % Set Rate:  [35 bmp] 35 bmp Vt Set:  [450 mL] 450 mL PEEP:  [10 cmH20] 10 cmH20 Plateau Pressure:  [22 cmH20-26 cmH20] 26 cmH20   Intake/Output Summary (Last 24 hours) at 12/16/2019 0842 Last data filed at 12/16/2019 0654 Gross per 24 hour  Intake 565.37 ml  Output 1200 ml  Net -634.63 ml   Filed Weights   12/14/19 0500 12/15/19 0500 12/16/19 0420  Weight: 94.1 kg 95.3 kg 97 kg    Examination: General: critically ill appearing adult male lying in bed in NAD on vent HEENT: MM pink/moist, ETT, pupils 69mm briskly reactive, dried blood in nare / improved from 10/11 exam  Neuro: sedate, raises eyebrows to name being called, grimaces with passive movement of extremities CV: s1s2 rrr, no m/r/g PULM: mild dyssynchrony, lungs bilaterally clear  GI: soft, bsx4 active  Extremities: warm/dry, trace dependent edema  Skin: no rashes or lesions  PCXR 10/12 >> images personally reviewed, ETT in good position, diffuse bilateral opacities  Resolved  Hospital Problem list   Mixed acidosis - Bicarb weaned to off 10/6 Severe leukocytosis, suspect steroid and stress  Assessment & Plan:   ARDS secondary to COVID PNA Completed remdesivir, baricitinib, steroids.  ETT with cuff leak 10/5, changed.  -low Vt ventilation 4-8cc/kg -goal plateau pressure <30, driving pressure <75 cm H2O -target PaO2 55-65, titrate PEEP/FiO2 per ARDS protocol  -hold prone 10/12 as  PF ratio 200 -if P/F ratio <150, consider prone therapy for 16 hours per day -goal CVP <4, diuresis as necessary -VAP prevention measures  -follow intermittent CXR   Acute Metabolic Encephalopathy  Sedation Needs while on Vent -PAD protocol with versed, fentanyl  -RASS Goal -2 to -3 with ventilator synchrony  -PRN paralytics for dyssynchrony   Shock In the setting of sedating medications + presumed sepsis.  Completed course of cefepime, echocardiogram 10/5 reassuring. Norepi weaned to off 10/7 -monitor hemodynamics in ICU   AKI, Non-Oliguric  Hyperkalemia  -worsening trend, may need Nephrology evaluation pending follow up labs -additional 15g kayexalate now  -lokelma 10mg  QD -Trend BMP / urinary output -Replace electrolytes as indicated -Avoid nephrotoxic agents, ensure adequate renal perfusion  Epistaxis Suspect traumatic from NG tube attempt.  INR 1.4.  LE duplex negative, ECHO negative.   Enoxaparin, aspirin, Plavix held. heparin gtt stopped on 10/6  -resume heparin SQ pm 10/12  -SCD's -follow Hgb trends -hold plavix -continue Afrin -ENT evaluation if further bleeding   Macrocytic Anemia  -trend CBC -assess B12  Hyperglycemia Mild, suspect steroid-induced -SSI    Best practice:  Diet: NPO, TF  Pain/Anxiety/Delirium protocol (if indicated): PAD protocol  VAP protocol (if indicated): Ordered DVT prophylaxis: SCD's, heparin restart 10/12, see above  GI prophylaxis: PPI Glucose control: SSI    Mobility: Bedrest Code Status: DNR Family Communication: Wife [name] [phone]) updated 10/12 via phone on plan of care.  Disposition: ICU   Labs    CBC: Recent Labs  Lab 12/12/19 0537 12/13/19 0400 12/14/19 0331 12/15/19 0415 12/16/19 0419  WBC 19.0* 13.6* 14.6* 13.6* 20.7*  NEUTROABS 16.8* 11.1* 11.6* 10.7* 17.9*  HGB 10.7* 9.0* 8.4* 7.8* 9.1*  HCT 34.3* 29.6* 28.9* 25.8* 30.6*  MCV 100.3* 101.7* 105.5* 103.2* 104.8*  PLT 149* 120* 139* 142* 185     Basic Metabolic Panel: Recent Labs  Lab 12/11/19 0307 12/11/19 0307 12/12/19 0537 12/13/19 0400 12/14/19 0331 12/15/19 0415 12/16/19 0419  NA 145   < > 145 147* 144 142 141  K 5.1   < > 4.8 4.6 5.1 4.8 7.0*  CL 105   < > 109 113* 112* 111 107  CO2 30   < > 29 28 27 25 26   GLUCOSE 113*   < > 170* 116* 124* 112* 130*  BUN 88*   < > 100* 103* 104* 105* 94*  CREATININE 2.64*   < > 2.24* 2.46* 2.92* 2.75* 3.57*  CALCIUM 7.9*   < > 7.9* 7.0* 7.1* 7.1* 7.8*  MG 3.1*  --  3.4* 3.2* 3.1* 2.8*  --   PHOS 5.9*  --  3.5 2.6 3.3 2.8  --    < > = values in this interval not displayed.   GFR: Estimated Creatinine Clearance: 23.9 mL/min (A) (by C-G formula based on SCr of 3.57 mg/dL (H)). Recent Labs  Lab 12/11/19 0307 12/12/19 0537 12/13/19 0400 12/14/19 0331 12/15/19 0415 12/16/19 0419  WBC 25.7*   < > 13.6* 14.6* 13.6* 20.7*  LATICACIDVEN 1.0  --   --   --   --   --    < > =  values in this interval not displayed.    Liver Function Tests: Recent Labs  Lab 12/10/19 0352 12/11/19 0307 12/12/19 0537  AST 33 35 41  ALT 76* 90* 95*  ALKPHOS 78 64 66  BILITOT 0.9 0.8 0.7  PROT 5.9* 5.7* 5.7*  ALBUMIN 2.2* 2.0* 1.9*   No results for input(s): LIPASE, AMYLASE in the last 168 hours. No results for input(s): AMMONIA in the last 168 hours.  ABG    Component Value Date/Time   PHART 7.285 (L) 12/15/2019 0934   PCO2ART 58.6 (H) 12/15/2019 0934   PO2ART 61.3 (L) 12/15/2019 0934   HCO3 26.9 12/15/2019 0934   ACIDBASEDEF 0.2 12/15/2019 0934   O2SAT 88.2 12/15/2019 0934     Coagulation Profile: Recent Labs  Lab 12/10/19 0352 12/12/19 0537  INR 1.4* 1.2    Cardiac Enzymes: No results for input(s): CKTOTAL, CKMB, CKMBINDEX, TROPONINI in the last 168 hours.  HbA1C: Hgb A1c MFr Bld  Date/Time Value Ref Range Status  11/25/2019 05:59 AM 5.9 (H) 4.8 - 5.6 % Final    Comment:    (NOTE) Pre diabetes:          5.7%-6.4%  Diabetes:              >6.4%  Glycemic control  for   <7.0% adults with diabetes     CBG: Recent Labs  Lab 12/15/19 1529 12/15/19 1946 12/15/19 2338 12/16/19 0402 12/16/19 0734  GLUCAP 137* 172* 148* 126* 120*     Critical care time: 40 minutes    Noe Gens, MSN, NP-C, AGACNP-BC [facility] 12/16/2019, 8:42 AM   Please see [email or web address] for pager details.

## 2019-12-16 NOTE — Progress Notes (Signed)
Pt flipped to supine position per proning guidelines.

## 2019-12-16 NOTE — Progress Notes (Signed)
BMP reviewed post treatment for hyperkalemia.    BMP Latest Ref Rng & Units 12/16/2019 12/16/2019 12/15/2019  Glucose 70 - 99 mg/dL 126(H) 130(H) 112(H)  BUN 8 - 23 mg/dL PENDING 94(H) 105(H)  Creatinine 0.61 - 1.24 mg/dL 4.00(H) 3.57(H) 2.75(H)  Sodium 135 - 145 mmol/L 142 141 142  Potassium 3.5 - 5.1 mmol/L 5.4(H) 7.0(HH) 4.8  Chloride 98 - 111 mmol/L 105 107 111  CO2 22 - 32 mmol/L 27 26 25   Calcium 8.9 - 10.3 mg/dL 7.8(L) 7.8(L) 7.1(L)     Plan: Continue scheduled lokelma, next dose in am  Assess FeNA Assess renal US to rule out hydronephrosis  NS at 132ml/hr until am, consider stopping pending follow up labs, I/O's, UOP    Wife called and updated on plan of care.    Ronald Gens, MSN, NP-C, AGACNP-BC [facility] 12/16/2019, 1:15 PM   Please see [email or web address] for pager details.

## 2019-12-16 NOTE — Progress Notes (Signed)
Nutrition Follow-up  DOCUMENTATION CODES:   Not applicable  INTERVENTION:  - will adjust TF regimen: Nepro @ 55 ml/hr with 90 ml Prosource TF BID. - this regimen will provide 2536 kcal (96% re-estimated kcal need), 151 grams protein, 1252 mg K, and 960 ml free water.  - free water flush to continue to be per CCM.    NUTRITION DIAGNOSIS:   Increased nutrient needs related to acute illness (COVID-19 infection) as evidenced by estimated needs. -ongoing  GOAL:   Patient will meet greater than or equal to 90% of their needs -met with TF regimen  MONITOR:   Vent status, TF tolerance, Labs, Weight trends  ASSESSMENT:   69 year old male with a past medical history for hypertension, dyslipidemia who presents with dyspnea, malaise, fatigue and cough for about 10 days.  Acute worsening of his symptoms over the last 72 hours prior to hospitalization.  Multiple sick contacts at home, he has not been vaccinated.Patient admitted to the hospital with working diagnosis of acute hypoxic respiratory failure due to SARS COVID-19 viral pneumonia.  Patient remains intubated with OGT in place. He was flipped to prone position yesterday ~1200 and was flipped back to supine earlier this AM. TF was held while he was in prone position and was re-started when he flipped back to supine (at ~0530).  He is currently receiving Vital AF 1.2 @ 60 ml/hr with 45 ml Prosource TF BID and 300 ml free water every 3 hours. This regimen is providing 1808 kcal, 130 grams protein, and 3567 ml free water.   Able to talk with RN who reports no issues with TF since re-start. She reports that TF was re-started at goal rate of 60 ml/hr. No plans to prone patient today.   Updated estimated needs based on ICU LOS and medical course. Used weight from 10/5 (88 kg) as this is lowest weight this admission and documentation in flow sheet of moderate pitting edema to BUE and mild pitting edema to BLE.   Per notes: - intubated on  10/5 - oozing from R nare which is draining into mouth--nasal packing was removed 10/9; thought to be 2/2 NGT attempt - ARDS 2/2 COVID-19 PNA - acute metabolic encephalopathy  - AKI   Patient is currently intubated on ventilator support MV: 13.6 L/min Temp (24hrs), Avg:98.3 F (36.8 C), Min:96.7 F (35.9 C), Max:99.8 F (37.7 C) Propofol: none   Labs reviewed; CBGs: 126 and 120 mg/dl, K: 7 mmol/l, BUN: 94 mmol/l, creatinine: 3.57 mg/dl, Ca: 7.8 mg/dl, GFR: 17 ml/min. Phos WDL and Mg: 2.8 mg/dl on 10/11. Medications reviewed; 100 mg colace BID, sliding scale novolog, 10 units novolog x1 dose this AM, 40 mg protonix per OGT/day, 17 g miralax/day, 100 mEq sodium bicarb x1 dose this AM, 15 g kayexalate x1 dose this AM, 10 g lokelma this AM and x1 dose scheduled for 10/13. Drips; fentanyl @ 225 mcg/hr, versed @ 1 mg/hr    Diet Order:   Diet Order            Diet NPO time specified  Diet effective now                 EDUCATION NEEDS:   No education needs have been identified at this time  Skin:  Skin Assessment: Skin Integrity Issues: Skin Integrity Issues:: Stage I Stage I: sacrum (newly documented on 10/8)  Last BM:  10/7  Height:   Ht Readings from Last 1 Encounters:  12/09/19 $RemoveB'5\' 11"'hCSCAcTE$  (1.803 m)  Weight:   Wt Readings from Last 1 Encounters:  12/16/19 97 kg     Estimated Nutritional Needs:  Kcal:  2640 kcal (30 kcal/kg) Protein:  132-150 grams (1.5-1.7 grams/kg) Fluid:  >/= 3 L/day     Jarome Matin, MS, RD, LDN, CNSC Inpatient Clinical Dietitian RD pager # available in AMION  After hours/weekend pager # available in [facility]

## 2019-12-16 NOTE — Progress Notes (Addendum)
Measurements: W 9cm x L 7cm

## 2019-12-16 NOTE — Progress Notes (Signed)
[facility] Progress Note Patient Name: Ronald Olsen DOB: [birth year] MRN: [id number]   Date of Service  12/16/2019  HPI/Events of Note  Hyperkalemia - K+ = 7.0. Not hemolyzed.   eICU Interventions  Plan: 1. Lokelma 10 gm per tube X 1. 2. D50 1 amp IV now. 3. Novolog 10 units IV now.  4. NaHCO3 100 meq IV now.  5. Calcium gluconate 1 gm IV now. 6. Repeat BMP at 12 noon.      Intervention Category Major Interventions: Electrolyte abnormality - evaluation and management  Desmon Hitchner [place] 12/16/2019, 5:27 AM

## 2019-12-16 NOTE — Progress Notes (Signed)
CRITICAL VALUE ALERT  Critical Value:  Potassium 7.0   Date & Time Notied:  12/16/2019 05:17 am  Provider Notified: E-link notified   Orders Received/Actions taken: No new orders at current time

## 2019-12-17 ENCOUNTER — Inpatient Hospital Stay (HOSPITAL_COMMUNITY): Payer: Medicare Other

## 2019-12-17 DIAGNOSIS — U071 COVID-19: Secondary | ICD-10-CM | POA: Diagnosis not present

## 2019-12-17 DIAGNOSIS — J9601 Acute respiratory failure with hypoxia: Secondary | ICD-10-CM | POA: Diagnosis not present

## 2019-12-17 LAB — BASIC METABOLIC PANEL
Anion gap: 12 (ref 5–15)
Anion gap: 12 (ref 5–15)
BUN: 120 mg/dL — ABNORMAL HIGH (ref 8–23)
BUN: 147 mg/dL — ABNORMAL HIGH (ref 8–23)
CO2: 27 mmol/L (ref 22–32)
CO2: 27 mmol/L (ref 22–32)
Calcium: 7.9 mg/dL — ABNORMAL LOW (ref 8.9–10.3)
Calcium: 7.4 mg/dL — ABNORMAL LOW (ref 8.9–10.3)
Chloride: 103 mmol/L (ref 98–111)
Chloride: 101 mmol/L (ref 98–111)
Creatinine, Ser: 4.6 mg/dL — ABNORMAL HIGH (ref 0.61–1.24)
Creatinine, Ser: 4.19 mg/dL — ABNORMAL HIGH (ref 0.61–1.24)
GFR, Estimated: 12 mL/min — ABNORMAL LOW (ref >60)
GFR, Estimated: 14 mL/min — ABNORMAL LOW (ref >60)
Glucose, Bld: 107 mg/dL — ABNORMAL HIGH (ref 70–99)
Glucose, Bld: 124 mg/dL — ABNORMAL HIGH (ref 70–99)
Potassium: 5.7 mmol/L — ABNORMAL HIGH (ref 3.5–5.1)
Potassium: 4.9 mmol/L (ref 3.5–5.1)
Sodium: 142 mmol/L (ref 135–145)
Sodium: 140 mmol/L (ref 135–145)

## 2019-12-17 LAB — URINALYSIS, ROUTINE W REFLEX MICROSCOPIC
Bilirubin Urine: NEGATIVE
Glucose, UA: NEGATIVE mg/dL
Ketones, ur: NEGATIVE mg/dL
Nitrite: NEGATIVE
Protein, ur: 30 mg/dL — AB
RBC / HPF: >50 RBC/hpf — ABNORMAL HIGH (ref 0–5)
Specific Gravity, Urine: 1.008 (ref 1.005–1.030)
pH: 5 (ref 5.0–8.0)

## 2019-12-17 LAB — GLUCOSE, CAPILLARY
Glucose-Capillary: 150 mg/dL — ABNORMAL HIGH (ref 70–99)
Glucose-Capillary: 109 mg/dL — ABNORMAL HIGH (ref 70–99)
Glucose-Capillary: 113 mg/dL — ABNORMAL HIGH (ref 70–99)
Glucose-Capillary: 118 mg/dL — ABNORMAL HIGH (ref 70–99)
Glucose-Capillary: 126 mg/dL — ABNORMAL HIGH (ref 70–99)
Glucose-Capillary: 125 mg/dL — ABNORMAL HIGH (ref 70–99)
Glucose-Capillary: 127 mg/dL — ABNORMAL HIGH (ref 70–99)
Glucose-Capillary: 131 mg/dL — ABNORMAL HIGH (ref 70–99)
Glucose-Capillary: 105 mg/dL — ABNORMAL HIGH (ref 70–99)
Glucose-Capillary: 131 mg/dL — ABNORMAL HIGH (ref 70–99)

## 2019-12-17 LAB — BLOOD GAS, ARTERIAL
Acid-Base Excess: 1 mmol/L (ref 0.0–2.0)
Bicarbonate: 28.7 mmol/L — ABNORMAL HIGH (ref 20.0–28.0)
FIO2: 50
O2 Saturation: 92.6 %
Patient temperature: 98.7
pCO2 arterial: 66.3 mmHg (ref 32.0–48.0)
pH, Arterial: 7.26 — ABNORMAL LOW (ref 7.350–7.450)
pO2, Arterial: 71.7 mmHg — ABNORMAL LOW (ref 83.0–108.0)

## 2019-12-17 LAB — CBC
HCT: 27.3 % — ABNORMAL LOW (ref 39.0–52.0)
Hemoglobin: 8.3 g/dL — ABNORMAL LOW (ref 13.0–17.0)
MCH: 31 pg (ref 26.0–34.0)
MCHC: 30.4 g/dL (ref 30.0–36.0)
MCV: 101.9 fL — ABNORMAL HIGH (ref 80.0–100.0)
Platelets: 155 10*3/uL (ref 150–400)
RBC: 2.68 MIL/uL — ABNORMAL LOW (ref 4.22–5.81)
RDW: 13.6 % (ref 11.5–15.5)
WBC: 14.8 10*3/uL — ABNORMAL HIGH (ref 4.0–10.5)
nRBC: 0 % (ref 0.0–0.2)

## 2019-12-17 LAB — SODIUM, URINE, RANDOM: Sodium, Ur: 95 mmol/L

## 2019-12-17 LAB — D-DIMER, QUANTITATIVE: D-Dimer, Quant: 3.51 ug/mL-FEU — ABNORMAL HIGH (ref 0.00–0.50)

## 2019-12-17 LAB — CK: Total CK: 621 U/L — ABNORMAL HIGH (ref 49–397)

## 2019-12-17 MED ORDER — SODIUM CHLORIDE 0.9 % IV SOLN
3.0000 g | Freq: Two times a day (BID) | INTRAVENOUS | Status: DC
  Administered 2019-12-17 – 2019-12-24 (×14): 3 g via INTRAVENOUS
  Filled 2019-12-17: qty 8
  Filled 2019-12-17: qty 3
  Filled 2019-12-17 (×2): qty 8
  Filled 2019-12-17: qty 3
  Filled 2019-12-17 (×2): qty 8
  Filled 2019-12-17: qty 3
  Filled 2019-12-17 (×2): qty 8
  Filled 2019-12-17: qty 3
  Filled 2019-12-17: qty 8
  Filled 2019-12-17: qty 3
  Filled 2019-12-17 (×2): qty 8

## 2019-12-17 MED ORDER — MIDAZOLAM 50MG/50ML (1MG/ML) PREMIX INFUSION
0.0000 mg/h | INTRAVENOUS | Status: DC
  Administered 2019-12-17 – 2019-12-19 (×3): 2 mg/h via INTRAVENOUS
  Filled 2019-12-17 (×3): qty 50

## 2019-12-17 MED ORDER — SODIUM ZIRCONIUM CYCLOSILICATE 10 G PO PACK
10.0000 g | PACK | Freq: Three times a day (TID) | ORAL | Status: DC
  Administered 2019-12-17 – 2019-12-18 (×5): 10 g
  Filled 2019-12-17 (×6): qty 1

## 2019-12-17 MED ORDER — FUROSEMIDE 10 MG/ML IJ SOLN
80.0000 mg | Freq: Three times a day (TID) | INTRAMUSCULAR | Status: AC
  Administered 2019-12-17 (×3): 80 mg via INTRAVENOUS
  Filled 2019-12-17 (×3): qty 8

## 2019-12-17 NOTE — Progress Notes (Signed)
RN held 0600 dose of heparin due to increased bleeding from nose, bloody oral secretions and drop of hemoglobin from 9.1 to 8.3- E-Link notified of held medications.

## 2019-12-17 NOTE — Progress Notes (Signed)
58 mg Versed wasted into sink w/ Marian Sorrow RN

## 2019-12-17 NOTE — TOC Progression Note (Signed)
Transition of Care [facility]) - Progression Note    Patient Details  Name: Ronald Olsen MRN: [id number] Date of Birth: [birth year]  Transition of Care [facility]) CM/SW Contact  Leeroy Cha, RN Phone Number: 12/17/2019, 8:03 AM  Clinical Narrative:    Remains on vent at 50% fi02, bun 120,creat 4.60, a.lines remain, rec'ing t.feeds, iv sedation, wcb 14.8 and hgb 8.3 with bleeding present in the nose, iv heparin being held. covid + unvaccinated, following for progression and toc needs.   Expected Discharge Plan: [place] Barriers to Discharge: Barriers Unresolved (comment)  Expected Discharge Plan and Services Expected Discharge Plan: [place]   Discharge Planning Services: CM Consult   Living arrangements for the past 2 months: Single Family Home                                       Social Determinants of Health (SDOH) Interventions    Readmission Risk Interventions No flowsheet data found.

## 2019-12-17 NOTE — Progress Notes (Signed)
CRITICAL VALUE ALERT  Critical Value:  PCO2 66.3  Date & Time Notied:  12/17/2019 0450  Provider Notified: E-Link notified  Orders Received/Actions taken: No new orders at this time

## 2019-12-17 NOTE — Progress Notes (Addendum)
NAME:  Ronald Olsen, MRN:  [id number], DOB:  03-23-1950, LOS: 87 ADMISSION DATE:  11/25/2019, CONSULTATION DATE:  10/4 REFERRING MD:  Doreatha Martin, CHIEF COMPLAINT:  Respiratory failure in setting of COVID-19   Brief History   69 year old male patient admitted on 9/20 with Covid pneumonia. Treated with supplemental oxygen, systemic steroids, remdesivir and baricitinib. Progressive resp failure, intubated 10/5. Evolving ARF. Treating empirically for possible HCAP.  Past Medical History  Hypertension, dyslipidemia. Coronary artery disease  Significant Hospital Events   9/20  Admit, COVID positive, PCT <0.10, BNP neg. High dose steroids, Remdesivir and baricitinib.  9/21 HFNC 15 liters    9/22 CRP down 3.4 but still on high FIO2 9/25 Completed day 5 of Remdesivir. Still on high dose steroids and Baricitinib  9/26 Increased oxygen demand. Placed on NRB and high flow.  9/28 Rapid desaturation w/ any movement  9/29 CT angio negative for PE  10/4 Worsening resp failure, rapid response called. PCCM asked to see w/ tranfers to ICU 10/5 Intubated. Replaced w larger 8.0 ETT 10/5 for intermittent cuffleak. Epistaxis  10/6  Prone positioning -FiO2 0.60, PEEP 10 ,Continued oozing from nare, even after packing.  Heparin held 10/8 FiO2 increased to 60% due to desaturation 10/9 Nasal packing removed 10/11 Ongoing oozing from right nares, draining into mouth, vent dyssynchrony.  Heparin SQ held. P/F 87, prone  Consults:  Pulmonary consulted 10/4 ENT 10/9  Procedures:  ETT 10/5 >>  LUE PICC 10/5 >>  R Femoral ALine 10/6 >>  Significant Diagnostic Tests:  9/29 CT Chest 9/29 >> multifocal groundglass attenuations bilaterally through all lobes 10/5 ECHO >> Intact LV function 62-95%, grade 1 diastolic dysfunction, normal RV function and size 10/7 LE Venous duplex >> negative  Micro Data:  COVID 9/29 >> Positive BCx2 9/20 >> Negative  Antimicrobials:  Remdesivir 9/20 >> 9/25 Vanc 10/3 >>  10/6 Ceftriaxone 10/3 >> 10/4 Cefepime 10/4 >> 10/9 Azitho 10/3 >> 10/6  Interim history/subjective:  Afebrile  Worsening renal function, 826ml UOP in last 24 hours Vent - 10 PEEP, 50% FiO2 Oozing from nose  I/O 826ml UOP, +2L in last 24 hours   Objective   Blood pressure (!) 141/74, pulse 96, temperature (!) 96.4 F (35.8 C), temperature source Axillary, resp. rate (!) 35, height 5\' 11"  (1.803 m), weight 102.1 kg, SpO2 94 %. CVP:  [7 mmHg-9 mmHg] 9 mmHg  Vent Mode: PRVC FiO2 (%):  [50 %] 50 % Set Rate:  [35 bmp] 35 bmp Vt Set:  [450 mL] 450 mL PEEP:  [10 cmH20] 10 cmH20 Plateau Pressure:  [27 cmH20-29 cmH20] 29 cmH20   Intake/Output Summary (Last 24 hours) at 12/17/2019 2841 Last data filed at 12/17/2019 0901 Gross per 24 hour  Intake 2780.24 ml  Output 875 ml  Net 1905.24 ml   Filed Weights   12/15/19 0500 12/16/19 0420 12/17/19 0413  Weight: 95.3 kg 97 kg 102.1 kg    Examination: General: adult male lying in bed in NAD on vent, critically ill appearin g HEENT: MM pink/moist, ETT, bloody secretions at right nares, anicteric  Neuro: sedate CV: s1s2 RRR, no m/r/g PULM: non-labored, mild dyssynchrony on vent, lungs bilaterally clear  GI: soft, bsx4 active  Extremities: warm/dry, 1+ edema  Skin: no rashes or lesions  Resolved Hospital Problem list   Mixed acidosis - Bicarb weaned to off 10/6 Severe leukocytosis, suspect steroid and stress  Assessment & Plan:   ARDS secondary to COVID PNA Completed remdesivir, baricitinib, steroids.  ETT with  cuff leak 10/5, changed.  -low Vt ventilation 4-8cc/kg -goal plateau pressure <30, driving pressure <44 cm H2O -target PaO2 55-65, titrate PEEP/FiO2 per ARDS protocol  -if P/F ratio <150, consider prone therapy for 16 hours per day -goal CVP <4, diuresis 10/13 -VAP prevention measures  -follow intermittent CXR   Acute Metabolic Encephalopathy  Sedation Needs while on Vent -PAD protocol with versed, fentanyl  -RASS  Goal -2 to -3 with ventilator synchrony  -PRN paralytics for dyssynchrony, no dosing in last 48 hours   Shock In the setting of sedating medications + presumed sepsis.  Completed course of cefepime, echocardiogram 10/5 reassuring. Norepi weaned to off 10/7 -monitor in ICU, remains hemodynamically stable  AKI, Non-Oliguric  Hyperkalemia  -worsening renal failure, trial high dose lasix 10/13 -Nephrology consulted -assess UA, CK -lokelma 10mg  QD -Trend BMP / urinary output -Replace electrolytes as indicated -Avoid nephrotoxic agents, ensure adequate renal perfusion  Epistaxis Suspect traumatic from NG tube attempt.  INR 1.4.  LE duplex negative, ECHO negative.   Enoxaparin, aspirin, Plavix held. heparin gtt stopped on 10/6  -continue heparin SQ for DVT dosing  -monitor Hgb trend, bleeding volume  -SCD's  -Afrin PRN  -may need ENT if significant bleeding   Macrocytic Anemia  B12 964 -follow CBC  Hyperglycemia Mild, suspect steroid-induced -SSI    Best practice:  Diet: NPO, TF  Pain/Anxiety/Delirium protocol (if indicated): PAD protocol  VAP protocol (if indicated): Ordered DVT prophylaxis: SCD's, heparin restarted 10/12, see above  GI prophylaxis: PPI Glucose control: SSI    Mobility: Bedrest Code Status: DNR Family Communication: Wife [name] [phone]) updated 10/13 via phone on plan of care.  Disposition: ICU   Labs    CBC: Recent Labs  Lab 12/12/19 0537 12/12/19 0537 12/13/19 0400 12/14/19 0331 12/15/19 0415 12/16/19 0419 12/17/19 0406  WBC 19.0*   < > 13.6* 14.6* 13.6* 20.7* 14.8*  NEUTROABS 16.8*  --  11.1* 11.6* 10.7* 17.9*  --   HGB 10.7*   < > 9.0* 8.4* 7.8* 9.1* 8.3*  HCT 34.3*   < > 29.6* 28.9* 25.8* 30.6* 27.3*  MCV 100.3*   < > 101.7* 105.5* 103.2* 104.8* 101.9*  PLT 149*   < > 120* 139* 142* 185 155   < > = values in this interval not displayed.    Basic Metabolic Panel: Recent Labs  Lab 12/11/19 0307 12/11/19 0307 12/12/19 0537  12/12/19 0537 12/13/19 0400 12/13/19 0400 12/14/19 0331 12/15/19 0415 12/16/19 0419 12/16/19 1129 12/17/19 0406  NA 145   < > 145   < > 147*   < > 144 142 141 142 142  K 5.1   < > 4.8   < > 4.6   < > 5.1 4.8 7.0* 5.4* 5.7*  CL 105   < > 109   < > 113*   < > 112* 111 107 105 103  CO2 30   < > 29   < > 28   < > 27 25 26 27 27   GLUCOSE 113*   < > 170*   < > 116*   < > 124* 112* 130* 126* 107*  BUN 88*   < > 100*   < > 103*   < > 104* 105* 94* 130* 120*  CREATININE 2.64*   < > 2.24*   < > 2.46*   < > 2.92* 2.75* 3.57* 4.00* 4.60*  CALCIUM 7.9*   < > 7.9*   < > 7.0*   < > 7.1*  7.1* 7.8* 7.8* 7.9*  MG 3.1*  --  3.4*  --  3.2*  --  3.1* 2.8*  --   --   --   PHOS 5.9*  --  3.5  --  2.6  --  3.3 2.8  --   --   --    < > = values in this interval not displayed.   GFR: Estimated Creatinine Clearance: 19 mL/min (A) (by C-G formula based on SCr of 4.6 mg/dL (H)). Recent Labs  Lab 12/11/19 0307 12/12/19 0537 12/14/19 0331 12/15/19 0415 12/16/19 0419 12/17/19 0406  WBC 25.7*   < > 14.6* 13.6* 20.7* 14.8*  LATICACIDVEN 1.0  --   --   --   --   --    < > = values in this interval not displayed.    Liver Function Tests: Recent Labs  Lab 12/11/19 0307 12/12/19 0537  AST 35 41  ALT 90* 95*  ALKPHOS 64 66  BILITOT 0.8 0.7  PROT 5.7* 5.7*  ALBUMIN 2.0* 1.9*   No results for input(s): LIPASE, AMYLASE in the last 168 hours. No results for input(s): AMMONIA in the last 168 hours.  ABG    Component Value Date/Time   PHART 7.260 (L) 12/17/2019 0439   PCO2ART 66.3 (HH) 12/17/2019 0439   PO2ART 71.7 (L) 12/17/2019 0439   HCO3 28.7 (H) 12/17/2019 0439   ACIDBASEDEF 0.2 12/15/2019 0934   O2SAT 92.6 12/17/2019 0439     Coagulation Profile: Recent Labs  Lab 12/12/19 0537  INR 1.2    Cardiac Enzymes: No results for input(s): CKTOTAL, CKMB, CKMBINDEX, TROPONINI in the last 168 hours.  HbA1C: Hgb A1c MFr Bld  Date/Time Value Ref Range Status  11/25/2019 05:59 AM 5.9 (H) 4.8 -  5.6 % Final    Comment:    (NOTE) Pre diabetes:          5.7%-6.4%  Diabetes:              >6.4%  Glycemic control for   <7.0% adults with diabetes     CBG: Recent Labs  Lab 12/16/19 1500 12/16/19 1930 12/17/19 0008 12/17/19 0305 12/17/19 0810  GLUCAP 118* 113* 126* 109* 150*     Critical care time: 34 minutes    Noe Gens, MSN, NP-C, AGACNP-BC [facility] 12/17/2019, 9:39 AM   Please see [email or web address] for pager details.

## 2019-12-17 NOTE — Consult Note (Addendum)
[facility] Nurse wound consult note Consultation was completed by review of records, images and assistance from the bedside nurse/clinical staff.  Re-consulted for same PI Seen by Wetzel Bjornstad 12/15/19 Reason for Consult: DTPI on the sacrum Wound type: DTPI Present on Adm: No Measurement: Nurse has documented that it is 9 cm x 7 cm however feel larger than that based on images. Superficial skin loss centrally.  Wound bed: Purple, maroon with partial skin loss Drainage (amount, consistency, odor) None Dressing procedure/placement/frequency: clean sacral area. Pat dry and place foam dressing.  Peel down foam dressing EACH shift.  Record your observations.  Change foam dressing every 3 days or PRN soiling.  Notify the physician team if the area worsens  Monitor the wound area(s) for worsening of condition such as: Signs/symptoms of infection,  Increase in size,  Development of or worsening of odor, Development of pain, or increased pain at the affected locations.   Notify the medical team if any of these develop.  Thank you for the consult.  [place] nurse will follow weekly. Please re-consult the [place] team if needed.  Cathlean Marseilles Tamala Julian, MSN, RN, [place], Lysle Pearl, [facility] Wound Treatment Associate Pager [phone]

## 2019-12-17 NOTE — Consult Note (Signed)
[name] Renal Consultation Note  Requesting MD:  Indication for Consultation: Acute kidney injury, maintenance of euvolemia, assessment treatment of hyperkalemia, evaluation and treatment of acid-base disorders, evaluation of anemia  HPI:  Ronald Olsen is a 69 y.o. male. This is a 69 year old gentleman with COVID-19 pneumonia treated with steroids and SVN and baricitinib was intubated 66/06/4032 course complicated by epistaxis requiring nasal packing. Was admitted 12/02/2019. Does have a prior history of hypertension gastroesophageal reflux disease hyperlipidemia. Renal function abruptly declined 12/10/2019. It appears that he has been nonoliguric although weights have been positive since admission and now weighs 102 kg.  Reviewing the records it appears that there was abrupt hypotension 74/04/5954 with a systolic blood pressure that dropped to less than 70 mmHg. This required the use of Neo-Synephrine. It also coincided with intubation for worsening respiratory failure  Renal ultrasound 12/16/2019 showed unremarkable kidneys. The does appear the use of IV contrast on 12/03/2019 with a CT angiogram which was negative for pulmonary embolus. Vancomycin was administered 10/3 and 12/08/2019.  Blood pressure 122/60 pulse 91 temperature 96.4 O2 sats 93% FiO2 50%  Sodium 142 potassium 5.7 chloride 103 CO2 27 BUN 120 creatinine 4.6 glucose 107 calcium 7.9 hemoglobin 8.3. Calcium 7.9  Chest x-ray positive for multifocal pneumonia  Atorvastatin 40 mg daily, Lasix 80 mg every 8 hours IV, Lokelma 10 g daily.  Creatinine, Ser  Date/Time Value Ref Range Status  12/17/2019 04:06 AM 4.60 (H) 0.61 - 1.24 mg/dL Final  12/16/2019 11:29 AM 4.00 (H) 0.61 - 1.24 mg/dL Final  12/16/2019 04:19 AM 3.57 (H) 0.61 - 1.24 mg/dL Final  12/15/2019 04:15 AM 2.75 (H) 0.61 - 1.24 mg/dL Final  12/14/2019 03:31 AM 2.92 (H) 0.61 - 1.24 mg/dL Final  12/13/2019 04:00 AM 2.46 (H) 0.61 - 1.24 mg/dL Final   12/12/2019 05:37 AM 2.24 (H) 0.61 - 1.24 mg/dL Final  12/11/2019 03:07 AM 2.64 (H) 0.61 - 1.24 mg/dL Final  12/10/2019 09:54 PM 2.71 (H) 0.61 - 1.24 mg/dL Final  12/10/2019 08:43 AM 2.47 (H) 0.61 - 1.24 mg/dL Final  12/10/2019 03:52 AM 2.16 (H) 0.61 - 1.24 mg/dL Final  12/09/2019 03:27 AM 1.07 0.61 - 1.24 mg/dL Final  12/08/2019 05:00 AM 1.10 0.61 - 1.24 mg/dL Final  12/07/2019 05:24 AM 1.06 0.61 - 1.24 mg/dL Final  12/06/2019 05:43 AM 0.98 0.61 - 1.24 mg/dL Final  12/05/2019 03:35 AM 1.04 0.61 - 1.24 mg/dL Final  12/04/2019 03:38 AM 1.19 0.61 - 1.24 mg/dL Final  12/03/2019 03:35 AM 1.19 0.61 - 1.24 mg/dL Final  12/02/2019 03:44 AM 1.24 0.61 - 1.24 mg/dL Final  12/01/2019 04:40 AM 1.29 (H) 0.61 - 1.24 mg/dL Final  11/30/2019 11:33 AM 1.05 0.61 - 1.24 mg/dL Final  11/29/2019 05:58 AM 1.25 (H) 0.61 - 1.24 mg/dL Final  11/28/2019 04:01 AM 1.33 (H) 0.61 - 1.24 mg/dL Final  11/27/2019 04:18 AM 1.24 0.61 - 1.24 mg/dL Final  11/26/2019 04:06 AM 1.25 (H) 0.61 - 1.24 mg/dL Final  11/25/2019 05:59 AM 1.24 0.61 - 1.24 mg/dL Final  11/23/2019 10:51 AM 1.49 (H) 0.61 - 1.24 mg/dL Final  06/13/2019 12:28 AM 1.41 (H) 0.61 - 1.24 mg/dL Final  09/14/2017 08:41 AM 1.22 0.40 - 1.50 mg/dL Final     PMHx:   Past Medical History:  Diagnosis Date  . Elevated PSA   . Family history of prostate problems   . Frequent headaches   . GERD (gastroesophageal reflux disease)   . Hyperlipidemia   . Hypertension   .  Migraines     Past Surgical History:  Procedure Laterality Date  . APPENDECTOMY    . CHOLECYSTECTOMY    . HERNIA REPAIR    . VASECTOMY      Family Hx:  Family History  Problem Relation Age of Onset  . Hypertension Mother   . Cancer Father   . Diabetes Father   . Heart attack Father   . Heart disease Father   . Hyperlipidemia Father   . Hypertension Father   . Hypertension Brother   . Hyperlipidemia Brother   . Heart disease Brother   . Hearing loss Brother   . Early death  Maternal Grandfather   . Diabetes Paternal Grandmother   . Early death Paternal Grandfather   . Heart disease Paternal Grandfather     Social History:  reports that he has quit smoking. He has quit using smokeless tobacco. He reports current alcohol use. He reports that he does not use drugs.  Allergies:  Allergies  Allergen Reactions  . Amoxicillin Other (See Comments)    "Cloudy headed" with long doses    Medications: Prior to Admission medications   Medication Sig Start Date End Date Taking? Authorizing Provider  alfuzosin (UROXATRAL) 10 MG 24 hr tablet Take 10 mg by mouth daily with breakfast.   Yes [provider]  aspirin EC 81 MG tablet Take 81 mg by mouth daily.   Yes [provider]  atorvastatin (LIPITOR) 40 MG tablet Take 40 mg by mouth daily. 04/28/19  Yes [provider]  clopidogrel (PLAVIX) 75 MG tablet Take 75 mg by mouth daily. 04/28/19  Yes [provider]  cyclobenzaprine (FLEXERIL) 10 MG tablet Take 10 mg by mouth 3 (three) times daily. 03/20/19  Yes [provider]  finasteride (PROSCAR) 5 MG tablet Take 5 mg by mouth daily. 05/05/19  Yes [provider]  lisinopril (ZESTRIL) 20 MG tablet Take 20 mg by mouth 2 (two) times daily. 06/01/19  Yes [provider]  pantoprazole (PROTONIX) 40 MG tablet TAKE 1 TABLET BY MOUTH ONCE DAILY Patient taking differently: Take 40 mg by mouth daily.  08/26/18  Yes Billie Ruddy, MD  sulfamethoxazole-trimethoprim (BACTRIM DS) 800-160 MG tablet Take 1 tablet by mouth 2 (two) times daily. Started 9.14.21 x10ds 11/18/19  Yes [provider]  nitroGLYCERIN (NITROSTAT) 0.4 MG SL tablet Place 0.4 mg under the tongue every 5 (five) minutes as needed for chest pain.  12/10/18   [provider]     Labs:  Results for orders placed or performed during the hospital encounter of 11/11/2019 (from the past 48 hour(s))  Glucose, capillary     Status: Abnormal   Collection  Time: 12/15/19  3:29 PM  Result Value Ref Range   Glucose-Capillary 137 (H) 70 - 99 mg/dL    Comment: Glucose reference range applies only to samples taken after fasting for at least 8 hours.   Comment 1 Notify RN    Comment 2 Document in Chart   Glucose, capillary     Status: Abnormal   Collection Time: 12/15/19  7:46 PM  Result Value Ref Range   Glucose-Capillary 172 (H) 70 - 99 mg/dL    Comment: Glucose reference range applies only to samples taken after fasting for at least 8 hours.  Glucose, capillary     Status: Abnormal   Collection Time: 12/15/19 11:38 PM  Result Value Ref Range   Glucose-Capillary 148 (H) 70 - 99 mg/dL    Comment: Glucose reference  range applies only to samples taken after fasting for at least 8 hours.  Glucose, capillary     Status: Abnormal   Collection Time: 12/16/19  4:02 AM  Result Value Ref Range   Glucose-Capillary 126 (H) 70 - 99 mg/dL    Comment: Glucose reference range applies only to samples taken after fasting for at least 8 hours.  CBC with Differential/Platelet     Status: Abnormal   Collection Time: 12/16/19  4:19 AM  Result Value Ref Range   WBC 20.7 (H) 4.0 - 10.5 K/uL   RBC 2.92 (L) 4.22 - 5.81 MIL/uL   Hemoglobin 9.1 (L) 13.0 - 17.0 g/dL   HCT 30.6 (L) 39 - 52 %   MCV 104.8 (H) 80.0 - 100.0 fL   MCH 31.2 26.0 - 34.0 pg   MCHC 29.7 (L) 30.0 - 36.0 g/dL   RDW 13.1 11.5 - 15.5 %   Platelets 185 150 - 400 K/uL   nRBC 0.0 0.0 - 0.2 %   Neutrophils Relative % 87 %   Neutro Abs 17.9 (H) 1.7 - 7.7 K/uL   Lymphocytes Relative 5 %   Lymphs Abs 1.0 0.7 - 4.0 K/uL   Monocytes Relative 5 %   Monocytes Absolute 1.1 (H) 0.1 - 1.0 K/uL   Eosinophils Relative 0 %   Eosinophils Absolute 0.0 0.0 - 0.5 K/uL   Basophils Relative 0 %   Basophils Absolute 0.1 0.0 - 0.1 K/uL   Immature Granulocytes 3 %   Abs Immature Granulocytes 0.62 (H) 0.00 - 0.07 K/uL    Comment: Performed at [facility], [place] [address] [place], [place]  [address]  D-dimer, quantitative (not at [facility])     Status: Abnormal   Collection Time: 12/16/19  4:19 AM  Result Value Ref Range   D-Dimer, Quant 3.88 (H) 0.00 - 0.50 ug/mL-FEU    Comment: (NOTE) At the manufacturer cut-off value of 0.5 g/mL FEU, this assay has a negative predictive value of 95-100%.This assay is intended for use in conjunction with a clinical pretest probability (PTP) assessment model to exclude pulmonary embolism (PE) and deep venous thrombosis (DVT) in outpatients suspected of PE or DVT. Results should be correlated with clinical presentation. Performed at [facility], [place] [address], [place], [place] [address]   Basic metabolic panel     Status: Abnormal   Collection Time: 12/16/19  4:19 AM  Result Value Ref Range   Sodium 141 135 - 145 mmol/L   Potassium 7.0 (HH) 3.5 - 5.1 mmol/L    Comment: NO VISIBLE HEMOLYSIS CRITICAL RESULT CALLED TO, READ BACK BY AND VERIFIED WITH: SHAY SMITH @ [phone] ON 12/16/19 C VARNER DELTA CHECK NOTED    Chloride 107 98 - 111 mmol/L   CO2 26 22 - 32 mmol/L   Glucose, Bld 130 (H) 70 - 99 mg/dL    Comment: Glucose reference range applies only to samples taken after fasting for at least 8 hours.   BUN 94 (H) 8 - 23 mg/dL    Comment: RESULTS CONFIRMED BY MANUAL DILUTION   Creatinine, Ser 3.57 (H) 0.61 - 1.24 mg/dL   Calcium 7.8 (L) 8.9 - 10.3 mg/dL   GFR, Estimated 17 (L) >60 mL/min   Anion gap 8 5 - 15    Comment: Performed at [facility], [place] [address] [place], [place] [address]  Glucose, capillary     Status: Abnormal   Collection Time: 12/16/19  7:34 AM  Result Value  Ref Range   Glucose-Capillary 120 (H) 70 - 99 mg/dL    Comment: Glucose reference range applies only to samples taken after fasting for at least 8 hours.  Blood gas, arterial     Status: Abnormal   Collection Time: 12/16/19  8:30 AM  Result Value Ref Range   FIO2 60.00    Delivery systems VENTILATOR    Mode PRESSURE  REGULATED VOLUME CONTROL    VT 450 mL   LHR 35 resp/min   pH, Arterial 7.232 (L) 7.35 - 7.45   pCO2 arterial 70.4 (HH) 32 - 48 mmHg    Comment: CRITICAL RESULT CALLED TO, READ BACK BY AND VERIFIED WITH: MANSFIELD,A @ 0919 ON [id number] BY POTEAT,S    pO2, Arterial 120 (H) 83 - 108 mmHg   Bicarbonate 28.5 (H) 20.0 - 28.0 mmol/L   Acid-Base Excess 0.8 0.0 - 2.0 mmol/L   O2 Saturation 98.3 %   Patient temperature 98.6    Collection site A-LINE     Comment: Performed at [facility], [place] [address], [place], [place] [address]  Glucose, capillary     Status: Abnormal   Collection Time: 12/16/19 11:27 AM  Result Value Ref Range   Glucose-Capillary 104 (H) 70 - 99 mg/dL    Comment: Glucose reference range applies only to samples taken after fasting for at least 8 hours.  Basic metabolic panel     Status: Abnormal   Collection Time: 12/16/19 11:29 AM  Result Value Ref Range   Sodium 142 135 - 145 mmol/L   Potassium 5.4 (H) 3.5 - 5.1 mmol/L    Comment: DELTA CHECK NOTED   Chloride 105 98 - 111 mmol/L   CO2 27 22 - 32 mmol/L   Glucose, Bld 126 (H) 70 - 99 mg/dL    Comment: Glucose reference range applies only to samples taken after fasting for at least 8 hours.   BUN 130 (H) 8 - 23 mg/dL    Comment: RESULTS CONFIRMED BY MANUAL DILUTION   Creatinine, Ser 4.00 (H) 0.61 - 1.24 mg/dL   Calcium 7.8 (L) 8.9 - 10.3 mg/dL   GFR, Estimated 15 (L) >60 mL/min   Anion gap 10 5 - 15    Comment: Performed at [facility], [place] [address] [place], [place] [address]  Vitamin B12     Status: Abnormal   Collection Time: 12/16/19 11:29 AM  Result Value Ref Range   Vitamin B-12 964 (H) 180 - 914 pg/mL    Comment: (NOTE) This assay is not validated for testing neonatal or myeloproliferative syndrome specimens for Vitamin B12 levels. Performed at [facility], [place] [address] [place], [place] [address]   Sodium, urine, random     Status: None    Collection Time: 12/16/19  1:27 PM  Result Value Ref Range   Sodium, Ur 46 mmol/L    Comment: Performed at [facility], [place] [address] [place], [place] [address]  Creatinine, urine, random     Status: None   Collection Time: 12/16/19  1:27 PM  Result Value Ref Range   Creatinine, Urine 111.26 mg/dL    Comment: Performed at [facility], [place] [address], [place], [place] [address]  Glucose, capillary     Status: Abnormal   Collection Time: 12/16/19  3:00 PM  Result Value Ref Range   Glucose-Capillary 118 (H) 70 - 99 mg/dL    Comment: Glucose reference range applies only to samples taken after  fasting for at least 8 hours.  Glucose, capillary     Status: Abnormal   Collection Time: 12/16/19  7:30 PM  Result Value Ref Range   Glucose-Capillary 113 (H) 70 - 99 mg/dL    Comment: Glucose reference range applies only to samples taken after fasting for at least 8 hours.  Glucose, capillary     Status: Abnormal   Collection Time: 12/17/19 12:08 AM  Result Value Ref Range   Glucose-Capillary 126 (H) 70 - 99 mg/dL    Comment: Glucose reference range applies only to samples taken after fasting for at least 8 hours.  Glucose, capillary     Status: Abnormal   Collection Time: 12/17/19  3:05 AM  Result Value Ref Range   Glucose-Capillary 109 (H) 70 - 99 mg/dL    Comment: Glucose reference range applies only to samples taken after fasting for at least 8 hours.  D-dimer, quantitative (not at [facility])     Status: Abnormal   Collection Time: 12/17/19  4:06 AM  Result Value Ref Range   D-Dimer, Quant 3.51 (H) 0.00 - 0.50 ug/mL-FEU    Comment: (NOTE) At the manufacturer cut-off value of 0.5 g/mL FEU, this assay has a negative predictive value of 95-100%.This assay is intended for use in conjunction with a clinical pretest probability (PTP) assessment model to exclude pulmonary embolism (PE) and deep venous thrombosis (DVT) in outpatients suspected of PE or  DVT. Results should be correlated with clinical presentation. Performed at [facility], [place] [address], [place], [place] [address]   CBC     Status: Abnormal   Collection Time: 12/17/19  4:06 AM  Result Value Ref Range   WBC 14.8 (H) 4.0 - 10.5 K/uL   RBC 2.68 (L) 4.22 - 5.81 MIL/uL   Hemoglobin 8.3 (L) 13.0 - 17.0 g/dL   HCT 27.3 (L) 39 - 52 %   MCV 101.9 (H) 80.0 - 100.0 fL   MCH 31.0 26.0 - 34.0 pg   MCHC 30.4 30.0 - 36.0 g/dL   RDW 13.6 11.5 - 15.5 %   Platelets 155 150 - 400 K/uL   nRBC 0.0 0.0 - 0.2 %    Comment: Performed at [facility], [place] [address], [place], [place] [address]  Basic metabolic panel     Status: Abnormal   Collection Time: 12/17/19  4:06 AM  Result Value Ref Range   Sodium 142 135 - 145 mmol/L   Potassium 5.7 (H) 3.5 - 5.1 mmol/L   Chloride 103 98 - 111 mmol/L   CO2 27 22 - 32 mmol/L   Glucose, Bld 107 (H) 70 - 99 mg/dL    Comment: Glucose reference range applies only to samples taken after fasting for at least 8 hours.   BUN 120 (H) 8 - 23 mg/dL    Comment: RESULTS CONFIRMED BY MANUAL DILUTION   Creatinine, Ser 4.60 (H) 0.61 - 1.24 mg/dL   Calcium 7.9 (L) 8.9 - 10.3 mg/dL   GFR, Estimated 12 (L) >60 mL/min   Anion gap 12 5 - 15    Comment: Performed at [facility], [place] [address], [place], [place] [address]  Blood gas, arterial     Status: Abnormal   Collection Time: 12/17/19  4:39 AM  Result Value Ref Range   FIO2 50.00    pH, Arterial 7.260 (L) 7.35 - 7.45   pCO2 arterial 66.3 (HH) 32 - 48 mmHg    Comment: CRITICAL RESULT CALLED TO,  READ BACK BY AND VERIFIED WITH: RN Ginger Organ AT 6301 12/17/19 CRUICKSHANK A     pO2, Arterial 71.7 (L) 83 - 108 mmHg   Bicarbonate 28.7 (H) 20.0 - 28.0 mmol/L   Acid-Base Excess 1.0 0.0 - 2.0 mmol/L   O2 Saturation 92.6 %   Patient temperature 98.7    Allens test (pass/fail) PASS PASS    Comment: Performed at [facility], [place]  [address], [place],  [address]  Glucose, capillary     Status: Abnormal   Collection Time: 12/17/19  8:10 AM  Result Value Ref Range   Glucose-Capillary 150 (H) 70 - 99 mg/dL    Comment: Glucose reference range applies only to samples taken after fasting for at least 8 hours.   Comment 1 Notify RN    Comment 2 Document in Chart      ROS:  Patient intubated and sedated  Physical Exam: Vitals:   12/17/19 0815 12/17/19 0900  BP:  (!) 141/74  Pulse:  96  Resp:  (!) 35  Temp: (!) 96.4 F (35.8 C)   SpO2:  94%     General: Critically ill-appearing adult male HEENT: Mucous membranes moist pink Eyes: Pupils round equal reactive Neck: Supple no thyromegaly Heart: Regular rate and rhythm without murmurs rubs or gallops Lungs: Mechanically supported breath sounds Abdomen: Hypoactive bowel sounds Extremities: No cyanosis clubbing   trace dependent edema Skin: No rashes skin lesions Neuro: Sedated grimaces positive movements  Assessment/Plan: 1.Acute kidney injury. Renal ultrasound did not reveal any evidence of hydronephrosis. The episode of acute kidney injury appeared to coincide with the episode of hypotension when patient request requiring intubation mechanical ventilation. Since that time patient has been in positive fluid balance. The does not appear to be any use nephrotoxins. There was the use of IV contrast but the timing of the acute kidney injury does not coincide with the use of IV contrast. This makes this etiology unlikely. There is also the use of vancomycin although this was discontinued on 12/08/2019 there is possibility that there may be some vancomycin nephrotoxicity including acute tubular necrosis. I will check urinalysis as one does not appear to be done we also check urine sodium. I do not think this represents acute interstitial nephritis or acute glomerulonephritis. These would be very difficult to diagnose there have been reported cases in the setting of Covid  pneumonia. 2. Hypertension/volume  -patient appears to be well-hydrated and does not appear to be intravascular volume depleted. There is the presence of peripheral edema. His blood pressure appears to be well controlled. Judicious use of diuretics may be needed. I am fine with the use of 80 mg IV every 8 hours 3. Hyperkalemia. I recommend the use of Lokelma as a temporizing measure and will increase the dose to 10 g three times daily. If the situation continues to worsen initiation of CRRT may be needed. 4. Anemia we will check iron studies and evaluate for ESA. 5. Covid 19 pneumonia as per primary service. 6. Acid-base status appears to be stable   Sherril Croon 12/17/2019, 10:24 AM

## 2019-12-17 NOTE — Progress Notes (Addendum)
Case reviewed with ENT, will plan to repack right side of nose and leave packing in place for 7 days.  Cover for toxic shock with PCN abx while packing in place. Will keep DVT dosing heparin given high risk DVT development.      Noe Gens, MSN, NP-C, AGACNP-BC [facility] 12/17/2019, 2:12 PM   Please see [email or web address] for pager details.

## 2019-12-17 NOTE — Progress Notes (Signed)
Pharmacy Antibiotic Note  Ronald Olsen is a 69 y.o. male admitted on 11/16/2019 with nosebleed. Marland Kitchen  Pharmacy has been consulted for Unasyn dosing to cover empirically for toxic shock while nose packing in place.   Amoxicillin intolerance noted with prolonged courses in past causing "cloudy head".  Patient currently in medically induced coma on venitlator with plans to leave packing in place for 7 days.    Acute kidney injury- Scr appears to have peaked and trending down.    Plan: Unasyn 3gm IV q12h F/U renal function Monitor for adverse reactions amoxicillin as able F/U to d/c Unasyn once nose packing removed  Height: 5\' 11"  (180.3 cm) Weight: 102.1 kg (225 lb 1.4 oz) IBW/kg (Calculated) : 75.3  Temp (24hrs), Avg:98 F (36.7 C), Min:96.4 F (35.8 C), Max:98.7 F (37.1 C)  Recent Labs  Lab 12/11/19 0307 12/12/19 0537 12/13/19 0400 12/13/19 0400 12/14/19 0331 12/14/19 0331 12/15/19 0415 12/16/19 0419 12/16/19 1129 12/17/19 0406 12/17/19 1302  WBC 25.7*   < > 13.6*  --  14.6*  --  13.6* 20.7*  --  14.8*  --   CREATININE 2.64*   < > 2.46*   < > 2.92*   < > 2.75* 3.57* 4.00* 4.60* 4.19*  LATICACIDVEN 1.0  --   --   --   --   --   --   --   --   --   --    < > = values in this interval not displayed.    Estimated Creatinine Clearance: 20.8 mL/min (A) (by C-G formula based on SCr of 4.19 mg/dL (H)).    Allergies  Allergen Reactions  . Amoxicillin Other (See Comments)    "Cloudy headed" with long doses    Antimicrobials this admission:  9/20 Remdesivir  > 9/24 9/20 Baricitinib >10/3 10/3 vanc>>10/4 10/4 cefepime>> 10/9 10/3 CTX> 10/4 10/3 azith>> 10/7 10/13 Unasyn>>  Dose adjustments this admission:  10/6 Cefepime 2gm q8 > q12  Microbiology results:  9/20 BCx x2: NGF 9/20 COVID+ 10/3 MRSA PCR neg 10/4 MRSA PCR neg  Thank you for allowing pharmacy to be a part of this patient's care.  Netta Cedars, PharmD, BCPS 12/17/2019 4:08 PM

## 2019-12-18 ENCOUNTER — Encounter (HOSPITAL_COMMUNITY): Payer: Self-pay | Admitting: Internal Medicine

## 2019-12-18 ENCOUNTER — Inpatient Hospital Stay (HOSPITAL_COMMUNITY): Payer: Medicare Other

## 2019-12-18 DIAGNOSIS — U071 COVID-19: Secondary | ICD-10-CM | POA: Diagnosis not present

## 2019-12-18 DIAGNOSIS — J9601 Acute respiratory failure with hypoxia: Secondary | ICD-10-CM | POA: Diagnosis not present

## 2019-12-18 LAB — CBC
HCT: 26 % — ABNORMAL LOW (ref 39.0–52.0)
Hemoglobin: 8.2 g/dL — ABNORMAL LOW (ref 13.0–17.0)
MCH: 31.4 pg (ref 26.0–34.0)
MCHC: 31.5 g/dL (ref 30.0–36.0)
MCV: 99.6 fL (ref 80.0–100.0)
Platelets: 210 10*3/uL (ref 150–400)
RBC: 2.61 MIL/uL — ABNORMAL LOW (ref 4.22–5.81)
RDW: 13.8 % (ref 11.5–15.5)
WBC: 15.6 10*3/uL — ABNORMAL HIGH (ref 4.0–10.5)
nRBC: 0 % (ref 0.0–0.2)

## 2019-12-18 LAB — COMPREHENSIVE METABOLIC PANEL
ALT: 149 U/L — ABNORMAL HIGH (ref 0–44)
ALT: 132 U/L — ABNORMAL HIGH (ref 0–44)
AST: 63 U/L — ABNORMAL HIGH (ref 15–41)
AST: 51 U/L — ABNORMAL HIGH (ref 15–41)
Albumin: 1.9 g/dL — ABNORMAL LOW (ref 3.5–5.0)
Albumin: 1.7 g/dL — ABNORMAL LOW (ref 3.5–5.0)
Alkaline Phosphatase: 76 U/L (ref 38–126)
Alkaline Phosphatase: 75 U/L (ref 38–126)
Anion gap: 11 (ref 5–15)
Anion gap: 13 (ref 5–15)
BUN: 157 mg/dL — ABNORMAL HIGH (ref 8–23)
BUN: 160 mg/dL — ABNORMAL HIGH (ref 8–23)
CO2: 29 mmol/L (ref 22–32)
CO2: 29 mmol/L (ref 22–32)
Calcium: 7.8 mg/dL — ABNORMAL LOW (ref 8.9–10.3)
Calcium: 7.7 mg/dL — ABNORMAL LOW (ref 8.9–10.3)
Chloride: 97 mmol/L — ABNORMAL LOW (ref 98–111)
Chloride: 100 mmol/L (ref 98–111)
Creatinine, Ser: 4.53 mg/dL — ABNORMAL HIGH (ref 0.61–1.24)
Creatinine, Ser: 4.26 mg/dL — ABNORMAL HIGH (ref 0.61–1.24)
GFR, Estimated: 12 mL/min — ABNORMAL LOW (ref >60)
GFR, Estimated: 13 mL/min — ABNORMAL LOW (ref >60)
Glucose, Bld: 127 mg/dL — ABNORMAL HIGH (ref 70–99)
Glucose, Bld: 149 mg/dL — ABNORMAL HIGH (ref 70–99)
Potassium: 4.5 mmol/L (ref 3.5–5.1)
Potassium: 4.3 mmol/L (ref 3.5–5.1)
Sodium: 137 mmol/L (ref 135–145)
Sodium: 142 mmol/L (ref 135–145)
Total Bilirubin: 0.5 mg/dL (ref 0.3–1.2)
Total Bilirubin: 0.6 mg/dL (ref 0.3–1.2)
Total Protein: 5.7 g/dL — ABNORMAL LOW (ref 6.5–8.1)
Total Protein: 5.4 g/dL — ABNORMAL LOW (ref 6.5–8.1)

## 2019-12-18 LAB — GLUCOSE, CAPILLARY
Glucose-Capillary: 122 mg/dL — ABNORMAL HIGH (ref 70–99)
Glucose-Capillary: 137 mg/dL — ABNORMAL HIGH (ref 70–99)
Glucose-Capillary: 150 mg/dL — ABNORMAL HIGH (ref 70–99)
Glucose-Capillary: 154 mg/dL — ABNORMAL HIGH (ref 70–99)
Glucose-Capillary: 133 mg/dL — ABNORMAL HIGH (ref 70–99)

## 2019-12-18 LAB — D-DIMER, QUANTITATIVE: D-Dimer, Quant: 3.32 ug/mL-FEU — ABNORMAL HIGH (ref 0.00–0.50)

## 2019-12-18 MED ORDER — MIDAZOLAM HCL 2 MG/2ML IJ SOLN
1.0000 mg | INTRAMUSCULAR | Status: AC | PRN
  Administered 2019-12-18 – 2019-12-21 (×5): 2 mg via INTRAVENOUS
  Filled 2019-12-18 (×3): qty 2

## 2019-12-18 MED ORDER — HYDRALAZINE HCL 20 MG/ML IJ SOLN
10.0000 mg | INTRAMUSCULAR | Status: DC | PRN
  Administered 2019-12-19 – 2019-12-20 (×2): 20 mg via INTRAVENOUS
  Filled 2019-12-18: qty 1

## 2019-12-18 MED ORDER — HYDRALAZINE HCL 20 MG/ML IJ SOLN
5.0000 mg | INTRAMUSCULAR | Status: DC | PRN
  Filled 2019-12-18: qty 1

## 2019-12-18 MED ORDER — CLOPIDOGREL BISULFATE 75 MG PO TABS
75.0000 mg | ORAL_TABLET | Freq: Every day | ORAL | Status: DC
  Administered 2019-12-18 – 2019-12-30 (×13): 75 mg
  Filled 2019-12-18 (×13): qty 1

## 2019-12-18 NOTE — Progress Notes (Signed)
[name] ROUNDING NOTE   Subjective:   Brief history: Ronald Olsen is a 69 y.o. male. This is a 69 year old gentleman with COVID-19 pneumonia treated with steroids and SVN and baricitinib was intubated 07/06/9765 course complicated by epistaxis requiring nasal packing. Was admitted 11/26/2019. Does have a prior history of hypertension gastroesophageal reflux disease hyperlipidemia. Renal function abruptly declined 12/10/2019. It appears that he has been nonoliguric although weights have been positive since admission and now weighs 102 kg. Reviewing the records it appears that there was abrupt hypotension 34/03/9377 with a systolic blood pressure that dropped to less than 70 mmHg. This required the use of Neo-Synephrine. It also coincided with intubation for worsening respiratory failure Renal ultrasound 12/16/2019 showed unremarkable kidneys. The does appear the use of IV contrast on 12/03/2019 with a CT angiogram which was negative for pulmonary embolus. Vancomycin was administered 10/3 and 12/08/2019.  Urine output 2.9 L 12/17/2019 2.3 L 12/18/2019  Blood pressure 133/88 pulse 90 temperature 97.8 O2 sats 88% FiO2 50%  Sodium 137 potassium 4.5 chloride 90 CO2 29 BUN 157 creatinine 4.53 glucose 127 albumin 1.9 hemoglobin 8.2  Chest x-ray showed bilateral interstitial heterogenous airspace opacities  Objective:  Vital signs in last 24 hours:  Temp:  [97.9 F (36.6 C)-98.6 F (37 C)] 97.9 F (36.6 C) (10/14 0800) Pulse Rate:  [83-115] 87 (10/14 0800) Resp:  [23-38] 27 (10/14 0800) BP: (119-181)/(56-88) 153/88 (10/14 0800) SpO2:  [87 %-97 %] 87 % (10/14 0800) FiO2 (%):  [45 %-50 %] 50 % (10/14 0800) Weight:  [101.2 kg] 101.2 kg (10/14 0500)  Weight change: -0.9 kg Filed Weights   12/16/19 0420 12/17/19 0413 12/18/19 0500  Weight: 97 kg 102.1 kg 101.2 kg    Intake/Output: I/O last 3 completed shifts: In: 9561.9 [I.V.:1143.3; NG/GT:8219.2; IV  Piggyback:199.5] Out: 0240 [Urine:5250]   Intake/Output this shift:  No intake/output data recorded.    General: Critically ill-appearing adult male HEENT: Mucous membranes moist pink Eyes: Pupils round equal reactive Neck: Supple no thyromegaly Heart: Regular rate and rhythm without murmurs rubs or gallops Lungs: Mechanically supported breath sounds Abdomen: Hypoactive bowel sounds Extremities: No cyanosis clubbing   trace dependent edema Skin: No rashes skin lesions Neuro: Sedated grimaces positive movements   Basic Metabolic Panel: Recent Labs  Lab 12/12/19 0537 12/12/19 0537 12/13/19 0400 12/13/19 0400 12/14/19 0331 12/14/19 0331 12/15/19 0415 12/15/19 0415 12/16/19 0419 12/16/19 0419 12/16/19 1129 12/16/19 1129 12/17/19 0406 12/17/19 1302 12/18/19 0338  NA 145   < > 147*   < > 144   < > 142   < > 141  --  142  --  142 140 137  K 4.8   < > 4.6   < > 5.1   < > 4.8   < > 7.0*  --  5.4*  --  5.7* 4.9 4.5  CL 109   < > 113*   < > 112*   < > 111   < > 107  --  105  --  103 101 97*  CO2 29   < > 28   < > 27   < > 25   < > 26  --  27  --  _0 GLUCOSE 170*   < > 116*   < > 124*   < > 112*   < > 130*  --  126*  --  107* 124* 127*  BUN 100*   < > 103*   < >  104*   < > 105*   < > 94*  --  130*  --  120* 147* 157*  CREATININE 2.24*   < > 2.46*   < > 2.92*   < > 2.75*   < > 3.57*  --  4.00*  --  4.60* 4.19* 4.53*  CALCIUM 7.9*   < > 7.0*   < > 7.1*   < > 7.1*   < > 7.8*   < > 7.8*   < > 7.9* 7.4* 7.8*  MG 3.4*  --  3.2*  --  3.1*  --  2.8*  --   --   --   --   --   --   --   --   PHOS 3.5  --  2.6  --  3.3  --  2.8  --   --   --   --   --   --   --   --    < > = values in this interval not displayed.    Liver Function Tests: Recent Labs  Lab 12/12/19 0537 12/18/19 0338  AST 41 63*  ALT 95* 149*  ALKPHOS 66 76  BILITOT 0.7 0.5  PROT 5.7* 5.7*  ALBUMIN 1.9* 1.9*   No results for input(s): LIPASE, AMYLASE in the last 168 hours. No results for input(s): AMMONIA  in the last 168 hours.  CBC: Recent Labs  Lab 12/12/19 0537 12/12/19 0537 12/13/19 0400 12/13/19 0400 12/14/19 0331 12/15/19 0415 12/16/19 0419 12/17/19 0406 12/18/19 0338  WBC 19.0*   < > 13.6*   < > 14.6* 13.6* 20.7* 14.8* 15.6*  NEUTROABS 16.8*  --  11.1*  --  11.6* 10.7* 17.9*  --   --   HGB 10.7*   < > 9.0*   < > 8.4* 7.8* 9.1* 8.3* 8.2*  HCT 34.3*   < > 29.6*   < > 28.9* 25.8* 30.6* 27.3* 26.0*  MCV 100.3*   < > 101.7*   < > 105.5* 103.2* 104.8* 101.9* 99.6  PLT 149*   < > 120*   < > 139* 142* 185 155 210   < > = values in this interval not displayed.    Cardiac Enzymes: Recent Labs  Lab 12/17/19 0406  CKTOTAL 621*    BNP: Invalid input(s): POCBNP  CBG: Recent Labs  Lab 12/17/19 1629 12/17/19 1949 12/17/19 2313 12/18/19 0347 12/18/19 0736  GLUCAP 131* 105* 131* 122* 13*    Microbiology: Results for orders placed or performed during the hospital encounter of 11/28/2019  SARS Coronavirus 2 by RT PCR (hospital order, performed in [facility] hospital lab) Nasopharyngeal Nasopharyngeal Swab     Status: Abnormal   Collection Time: 11/21/2019 10:51 AM   Specimen: Nasopharyngeal Swab  Result Value Ref Range Status   SARS Coronavirus 2 POSITIVE (A) NEGATIVE Final    Comment: RESULT CALLED TO, READ BACK BY AND VERIFIED WITH: WEST,S. RN _0  ON 9.20.2021 BY NMCCOY (NOTE) SARS-CoV-2 target nucleic acids are DETECTED  SARS-CoV-2 RNA is generally detectable in upper respiratory specimens  during the acute phase of infection.  Positive results are indicative  of the presence of the identified virus, but do not rule out bacterial infection or co-infection with other pathogens not detected by the test.  Clinical correlation with patient history and  other diagnostic information is necessary to determine patient infection status.  The expected result is negative.  Fact Sheet for Patients:   StrictlyIdeas.no   Fact Sheet  for Healthcare  Providers:   BankingDealers.co.za    This test is not yet approved or cleared by the [place] and  has been authorized for detection and/or diagnosis of SARS-CoV-2 by FDA under an Emergency Use Authorization (EUA).  This EUA will remain in effect (meaning th is test can be used) for the duration of  the COVID-19 declaration under Section 564(b)(1) of the Act, 21 U.S.C. section 360-bbb-3(b)(1), unless the authorization is terminated or revoked sooner.  Performed at [facility], [place] [address] [place], [place] [address]   Blood Culture (routine x 2)     Status: None   Collection Time: 11/21/2019 10:51 AM   Specimen: BLOOD RIGHT HAND  Result Value Ref Range Status   Specimen Description   Final    BLOOD RIGHT HAND Performed at [facility], [place] [address] [place], [place] [address]    Special Requests   Final    BOTTLES DRAWN AEROBIC AND ANAEROBIC Blood Culture adequate volume Performed at [facility] [address], [place], [place] [address]    Culture   Final    NO GROWTH 5 DAYS Performed at [facility], [place] [address], [place], [place] [address]    Report Status 11/29/2019 FINAL  Final  Blood Culture (routine x 2)     Status: None   Collection Time: 11/17/2019  1:10 PM   Specimen: BLOOD  Result Value Ref Range Status   Specimen Description   Final    BLOOD LEFT WRIST Performed at [facility] [address] [place], [place] [address]    Special Requests   Final    BOTTLES DRAWN AEROBIC AND ANAEROBIC Blood Culture adequate volume Performed at [facility] [address], [place], [place] [address]    Culture   Final    NO GROWTH 5 DAYS Performed at [facility], [place] [address] [place], [place] [address]    Report Status 11/29/2019 FINAL  Final  MRSA PCR Screening     Status: None   Collection Time: 12/07/19  7:35 AM   Specimen: Nasal  Mucosa; Nasopharyngeal  Result Value Ref Range Status   MRSA by PCR NEGATIVE NEGATIVE Final    Comment:        The GeneXpert MRSA Assay (FDA approved for NASAL specimens only), is one component of a comprehensive MRSA colonization surveillance program. It is not intended to diagnose MRSA infection nor to guide or monitor treatment for MRSA infections. Performed at [facility], [place] [address] [place], [place] [address]   MRSA PCR Screening     Status: None   Collection Time: 12/08/19 12:11 PM   Specimen: Nasal Mucosa; Nasopharyngeal  Result Value Ref Range Status   MRSA by PCR NEGATIVE NEGATIVE Final    Comment:        The GeneXpert MRSA Assay (FDA approved for NASAL specimens only), is one component of a comprehensive MRSA colonization surveillance program. It is not intended to diagnose MRSA infection nor to guide or monitor treatment for MRSA infections. Performed at [facility], [place] [address] [place], [place] [address]     Coagulation Studies: No results for input(s): LABPROT, INR in the last 72 hours.  Urinalysis: Recent Labs    12/17/19 1540  COLORURINE YELLOW  LABSPEC 1.008  PHURINE 5.0  GLUCOSEU NEGATIVE  HGBUR LARGE*  BILIRUBINUR NEGATIVE  KETONESUR NEGATIVE  PROTEINUR 30*  NITRITE NEGATIVE  LEUKOCYTESUR SMALL*      Imaging: DG Abd 1 View  Result Date: 12/18/2019 CLINICAL DATA:  OG tube placement EXAM: ABDOMEN - 1 VIEW. Portable AP semi erect. Left flank collimated off view. COMPARISON:  X-ray abdomen 12/11/2019, chest x-ray 12/17/2019 FINDINGS: Enteric tube coursing below diaphragm with tip and side port overlying the expected region of the gastric lumen. Tip is likely in the region of the pylorus. The bowel gas pattern is normal. Surgical clips in the upper abdomen. No radio-opaque calculi or other significant radiographic abnormality are seen. Bilateral lower thoraces demonstrate patchy airspace opacities.  IMPRESSION: 1. Enteric tube in good position. 2. Nonobstructive bowel gas pattern. 3. Bibasilar pulmonary patchy airspace opacities consistent with known COVID infection. Electronically Signed   By: Iven Finn M.D.   On: 12/18/2019 01:47   US RENAL  Result Date: 12/16/2019 CLINICAL DATA:  70 year old male with acute renal insufficiency. EXAM: RENAL / URINARY TRACT ULTRASOUND COMPLETE COMPARISON:  Renal ultrasound dated 09/30/2019 and CT dated 06/13/2019. FINDINGS: Right Kidney: Renal measurements: 12.9 x 6.5 x 5.6 cm = volume: 247 mL. Echogenicity within normal limits. No mass or hydronephrosis visualized. Left Kidney: Renal measurements: 12.1 x 6.7 x 4.9 cm = volume: 207 mL. Echogenicity within normal limits. No mass or hydronephrosis visualized. Bladder: The urinary bladder is decompressed around a Foley catheter. There is diffuse bladder wall thickening measuring 13 mm which may be partly related to underdistention. Underlying inflammatory or infiltrative process is not excluded. Clinical correlation is recommended. Other: None. IMPRESSION: 1. Unremarkable kidneys. 2. Decompressed urinary bladder with diffuse wall thickening. Correlation with urinalysis recommended to exclude cystitis. Cystoscopy may provide better evaluation if there is clinical concern for an infiltrative process. Electronically Signed   By: Anner Crete M.D.   On: 12/16/2019 16:51   DG CHEST PORT 1 VIEW  Result Date: 12/17/2019 CLINICAL DATA:  Respiratory failure, COVID pneumonia EXAM: PORTABLE CHEST 1 VIEW COMPARISON:  12/17/2019 FINDINGS: No significant interval change in AP portable chest radiograph featuring diffuse bilateral interstitial and heterogeneous airspace opacity. No new airspace opacity. Support apparatus unchanged including endotracheal tube and esophagogastric tube. Left upper extremity PICC. The heart and mediastinum are unremarkable. IMPRESSION: 1. No significant interval change in AP portable chest  radiograph featuring diffuse bilateral interstitial and heterogeneous airspace opacity, consistent with COVID airspace disease. No new airspace opacity. 2. Stable support apparatus. Electronically Signed   By: Eddie Candle M.D.   On: 12/17/2019 11:05   DG CHEST PORT 1 VIEW  Result Date: 12/17/2019 CLINICAL DATA:  Acute respiratory distress EXAM: PORTABLE CHEST 1 VIEW COMPARISON:  12/16/2019 FINDINGS: Endotracheal tube is approximately 3.5 cm above the carina. Enteric tube passes into the distal stomach. Persistent bilateral ill-defined pulmonary opacities. Possible trace pleural effusions. Stable cardiomediastinal contours. No pneumothorax. IMPRESSION: Stable lines and tubes. Similar lung aeration with multifocal pneumonia again seen. Electronically Signed   By: Macy Mis M.D.   On: 12/17/2019 08:16   DG CHEST PORT 1 VIEW  Result Date: 12/16/2019 CLINICAL DATA:  Hypoxia EXAM: PORTABLE CHEST 1 VIEW COMPARISON:  December 14, 2019 FINDINGS: Endotracheal tube tip is 4.2 cm above the carina. Central catheter tip is in the superior vena cava. Nasogastric tube tip and side port are below the diaphragm. No pneumothorax. There is ill-defined airspace opacity throughout the lungs bilaterally, primarily in the mid and lower lung regions, stable. There is also interstitial thickening throughout the lungs, stable. Heart size and pulmonary vascularity are normal. No adenopathy. No bone lesions. IMPRESSION:  Tube and catheter positions as described without pneumothorax. Interstitial thickening and hazy airspace opacity bilaterally, stable, consistent with multifocal pneumonia, likely of atypical organism etiology. No new opacity evident. Stable cardiac silhouette. Electronically Signed   By: Lowella Grip III M.D.   On: 12/16/2019 09:02     Medications:   . sodium chloride 10 mL/hr at 12/18/19 0300  . ampicillin-sulbactam (UNASYN) IV Stopped (12/18/19 0419)  . feeding supplement (NEPRO CARB STEADY) 55  mL/hr at 12/18/19 0328  . fentaNYL infusion INTRAVENOUS 275 mcg/hr (12/18/19 0300)  . midazolam Stopped (12/17/19 1310)   . artificial tears  1 application Both Eyes J9E  . atorvastatin  40 mg Per Tube Daily  . chlorhexidine gluconate (MEDLINE KIT)  15 mL Mouth Rinse BID  . Chlorhexidine Gluconate Cloth  6 each Topical Daily  . docusate  100 mg Per Tube BID  . feeding supplement (PROSource TF)  90 mL Per Tube BID  . free water  300 mL Per Tube Q3H  . heparin injection (subcutaneous)  5,000 Units Subcutaneous Q8H  . insulin aspart  0-9 Units Subcutaneous Q4H  . mouth rinse  15 mL Mouth Rinse 10 times per day  . pantoprazole sodium  40 mg Per Tube Q1200  . polyethylene glycol  17 g Per Tube Daily  . sodium chloride flush  10-40 mL Intracatheter Q12H  . sodium zirconium cyclosilicate  10 g Per Tube TID   acetaminophen (TYLENOL) oral liquid 160 mg/5 mL, albuterol, fentaNYL, hydrALAZINE, midazolam, oxymetazoline, sodium chloride flush, vecuronium  Assessment/ Plan:  1.Acute kidney injury. Renal ultrasound did not reveal any evidence of hydronephrosis. The episode of acute kidney injury appeared to coincide with the episode of hypotension when patient request requiring intubation mechanical ventilation. Since that time patient has been in positive fluid balance. The does not appear to be any use nephrotoxins. There was the use of IV contrast but the timing of the acute kidney injury does not coincide with the use of IV contrast. This makes this etiology unlikely. There is also the use of vancomycin although this was discontinued on 12/08/2019 there is possibility that there may be some vancomycin nephrotoxicity including acute tubular necrosis.  Urine greater than 50 RBCs 21-50 WBCs urine sodium 95.  CPK 621.  I do not think this represents acute interstitial nephritis or acute glomerulonephritis. These would be very difficult to diagnose there have been reported cases in the setting of Covid  pneumonia. 2. Hypertension/volume  -patient appears to be well-hydrated and does not appear to be intravascular volume depleted. There is the presence of peripheral edema.  Renal function does appear to be plateauing.  There does not appear to be any acute worsening of his kidney function overnight.  I am fine with continuing to watch this.  I will be willing to collaborate with critical care medicine if they feel CRRT would benefit patient 3. Hyperkalemia.  This appears to have resolved with 10 g of Lokelma 3 times daily 4. Anemia we will check iron studies and evaluate for ESA. 5. Covid 19 pneumonia as per primary service. 6. Acid-base status appears to be stable    LOS: [place] _0 _1 :19 AM

## 2019-12-18 NOTE — Progress Notes (Addendum)
NAME:  Ronald Olsen, MRN:  [id number], DOB:  08-04-1950, LOS: 24 ADMISSION DATE:  11/14/2019, CONSULTATION DATE:  10/4 REFERRING MD:  Doreatha Martin, CHIEF COMPLAINT:  Respiratory failure in setting of COVID-19   Brief History   69 year old male patient admitted on 9/20 with Covid pneumonia. Treated with supplemental oxygen, systemic steroids, remdesivir and baricitinib. Progressive resp failure, intubated 10/5. Evolving ARF. Treated empirically for possible HCAP.   Past Medical History  Hypertension, dyslipidemia. Coronary artery disease  Significant Hospital Events   9/20  Admit, COVID positive, PCT <0.10, BNP neg. High dose steroids, Remdesivir and baricitinib.  9/21 HFNC 15 liters    9/22 CRP down 3.4 but still on high FIO2 9/25 Completed day 5 of Remdesivir. Still on high dose steroids and Baricitinib  9/26 Increased oxygen demand. Placed on NRB and high flow.  9/28 Rapid desaturation w/ any movement  9/29 CT angio negative for PE  10/4 Worsening resp failure, rapid response called. PCCM asked to see w/ tranfers to ICU 10/5 Intubated. Replaced w larger 8.0 ETT 10/5 for intermittent cuffleak. Epistaxis  10/6  Prone positioning -FiO2 0.60, PEEP 10 ,Continued oozing from nare, even after packing.  Heparin held 10/8 FiO2 increased to 60% due to desaturation 10/9 Nasal packing removed 10/11 Ongoing oozing from right nares, draining into mouth, vent dyssynchrony.  Heparin SQ held. P/F 87, prone 10/13 Nose repacked after review with ENT 10/14 Weaned to fentanyl gtt only, followed commands   Consults:  Pulmonary consulted 10/4 ENT 10/9, 10/13  Procedures:  ETT 10/5 >>  LUE PICC 10/5 >>  R Femoral ALine 10/6 >> 10/12  Significant Diagnostic Tests:  9/29 CT Chest 9/29 >> multifocal groundglass attenuations bilaterally through all lobes 10/5 ECHO >> Intact LV function 00-93%, grade 1 diastolic dysfunction, normal RV function and size 10/7 LE Venous duplex >> negative 10/12 Renal US  >> unremarkable kidneys, decompressed bladder but diffuse wall thickening  Micro Data:  COVID 9/29 >> Positive BCx2 9/20 >> Negative  Antimicrobials:  Remdesivir 9/20 >> 9/25 Vanc 10/3 >> 10/6 Ceftriaxone 10/3 >> 10/4 Cefepime 10/4 >> 10/9 Azitho 10/3 >> 10/6  Interim history/subjective:  I/O 4.6L UOP, +4.1L in last 24 hours Glucose range 122-137 Tmax 98.6, WBC 15.6 Vent - 50%, PEEP 10 RN reports no acute events  Objective   Blood pressure (!) 153/88, pulse 87, temperature 97.9 F (36.6 C), temperature source Axillary, resp. rate (!) 27, height 5\' 11"  (1.803 m), weight 101.2 kg, SpO2 (!) 87 %. CVP:  [6 mmHg-8 mmHg] 6 mmHg  Vent Mode: PRVC FiO2 (%):  [45 %-50 %] 50 % Set Rate:  [35 bmp] 35 bmp Vt Set:  [450 mL] 450 mL PEEP:  [10 cmH20] 10 cmH20 Plateau Pressure:  [18 cmH20-31 cmH20] 18 cmH20   Intake/Output Summary (Last 24 hours) at 12/18/2019 0949 Last data filed at 12/18/2019 0800 Gross per 24 hour  Intake 4531.37 ml  Output 4600 ml  Net -68.63 ml   Filed Weights   12/16/19 0420 12/17/19 0413 12/18/19 0500  Weight: 97 kg 102.1 kg 101.2 kg    Examination: General: critically ill appearing adult male lying in bed on vent in NAD HEENT: MM pink/moist, ETT, nasal packing in place, pupils reactive, anicteric  Neuro: sedate on fentanyl gtt, raises eyebrows to voice, wiggles bilateral toes to command CV: s1s2 RRR, SR-ST on monitor, no m/r/g PULM: non-labored on vent, lungs bilaterally clear GI: soft, bsx4 active  Extremities: warm/dry, trace dependent edema  Skin: no rashes or  lesions  Resolved Hospital Problem list   Mixed acidosis - Bicarb weaned to off 10/6 Severe leukocytosis, suspect steroid and stress  Assessment & Plan:   ARDS secondary to COVID PNA Completed remdesivir, baricitinib, steroids.  ETT with cuff leak 10/5, changed.  -low Vt ventilation 4-8cc/kg, he is on 6cc/kg > may be nearing point of liberalizing Vt -goal plateau pressure <30, driving  pressure <10 cm H2O -target PaO2 55-65, titrate PEEP/FiO2 per ARDS protocol  -if P/F ratio <150, consider prone therapy for 16 hours per day -goal CVP <4, diuresis as necessary -VAP prevention measures  -follow intermittent CXR   Acute Metabolic Encephalopathy  Sedation Needs while on Vent -PAD protocol with versed, fentanyl -RASS goal -2 to -3 -discontinue NMB from [facility]  Shock In the setting of sedating medications + presumed sepsis.  Completed course of cefepime, echocardiogram 10/5 reassuring. Norepi weaned to off 10/7 -ICU monitoring of hemodynamics  CAD  Hx stent on plavix -resume plavix -hold lipitor with elevated CK   AKI, Non-Oliguric  Hyperkalemia  Negative renal US, CK 621.  Likely ATN from hypotension.   -appreciate Nephrology input  -hold lasix 10/14, reassess 10/15 for repeat diuresis  -Trend BMP / urinary output -Replace electrolytes as indicated -Avoid nephrotoxic agents, ensure adequate renal perfusion -continue lokelma per Nephrology (TID)  Epistaxis Suspect traumatic from NG tube attempt.  INR 1.4.  LE duplex negative, ECHO negative.   Enoxaparin, aspirin, Plavix held. heparin gtt stopped on 10/6.  Re-packed 10/13, on DVT ppx.  -continue nasal packing for 7 days, remove 10/19 -empiric unasyn while nasal packing in place for toxic shock coverage -follow Hgb   -SCD's -PRN afrin   Macrocytic Anemia  B12 964 -follow CBC -anemia panel per Nephrology   Hyperglycemia Mild, suspect steroid-induced -SSI   Best practice:  Diet: NPO, TF  Pain/Anxiety/Delirium protocol (if indicated): PAD protocol  VAP protocol (if indicated): Ordered DVT prophylaxis: SCD's, heparin restarted 10/12, see above  GI prophylaxis: PPI Glucose control: SSI    Mobility: Bedrest Code Status: DNR Family Communication: Wife Ronald Olsen [phone]) updated 10/14 via phone on plan of care.  Disposition: ICU   Labs    CBC: Recent Labs  Lab 12/12/19 0537 12/12/19 0537  12/13/19 0400 12/13/19 0400 12/14/19 0331 12/15/19 0415 12/16/19 0419 12/17/19 0406 12/18/19 0338  WBC 19.0*   < > 13.6*   < > 14.6* 13.6* 20.7* 14.8* 15.6*  NEUTROABS 16.8*  --  11.1*  --  11.6* 10.7* 17.9*  --   --   HGB 10.7*   < > 9.0*   < > 8.4* 7.8* 9.1* 8.3* 8.2*  HCT 34.3*   < > 29.6*   < > 28.9* 25.8* 30.6* 27.3* 26.0*  MCV 100.3*   < > 101.7*   < > 105.5* 103.2* 104.8* 101.9* 99.6  PLT 149*   < > 120*   < > 139* 142* 185 155 210   < > = values in this interval not displayed.    Basic Metabolic Panel: Recent Labs  Lab 12/12/19 0537 12/12/19 0537 12/13/19 0400 12/13/19 0400 12/14/19 0331 12/14/19 0331 12/15/19 0415 12/15/19 0415 12/16/19 0419 12/16/19 1129 12/17/19 0406 12/17/19 1302 12/18/19 0338  NA 145   < > 147*   < > 144   < > 142   < > 141 142 142 140 137  K 4.8   < > 4.6   < > 5.1   < > 4.8   < > 7.0* 5.4* 5.7* 4.9  4.5  CL 109   < > 113*   < > 112*   < > 111   < > 107 105 103 101 97*  CO2 29   < > 28   < > 27   < > 25   < > 26 27 27 27 29   GLUCOSE 170*   < > 116*   < > 124*   < > 112*   < > 130* 126* 107* 124* 127*  BUN 100*   < > 103*   < > 104*   < > 105*   < > 94* 130* 120* 147* 157*  CREATININE 2.24*   < > 2.46*   < > 2.92*   < > 2.75*   < > 3.57* 4.00* 4.60* 4.19* 4.53*  CALCIUM 7.9*   < > 7.0*   < > 7.1*   < > 7.1*   < > 7.8* 7.8* 7.9* 7.4* 7.8*  MG 3.4*  --  3.2*  --  3.1*  --  2.8*  --   --   --   --   --   --   PHOS 3.5  --  2.6  --  3.3  --  2.8  --   --   --   --   --   --    < > = values in this interval not displayed.   GFR: Estimated Creatinine Clearance: 19.2 mL/min (A) (by C-G formula based on SCr of 4.53 mg/dL (H)). Recent Labs  Lab 12/15/19 0415 12/16/19 0419 12/17/19 0406 12/18/19 0338  WBC 13.6* 20.7* 14.8* 15.6*    Liver Function Tests: Recent Labs  Lab 12/12/19 0537 12/18/19 0338  AST 41 63*  ALT 95* 149*  ALKPHOS 66 76  BILITOT 0.7 0.5  PROT 5.7* 5.7*  ALBUMIN 1.9* 1.9*   No results for input(s): LIPASE, AMYLASE  in the last 168 hours. No results for input(s): AMMONIA in the last 168 hours.  ABG    Component Value Date/Time   PHART 7.260 (L) 12/17/2019 0439   PCO2ART 66.3 (HH) 12/17/2019 0439   PO2ART 71.7 (L) 12/17/2019 0439   HCO3 28.7 (H) 12/17/2019 0439   ACIDBASEDEF 0.2 12/15/2019 0934   O2SAT 92.6 12/17/2019 0439     Coagulation Profile: Recent Labs  Lab 12/12/19 0537  INR 1.2    Cardiac Enzymes: Recent Labs  Lab 12/17/19 0406  CKTOTAL 621*    HbA1C: Hgb A1c MFr Bld  Date/Time Value Ref Range Status  11/25/2019 05:59 AM 5.9 (H) 4.8 - 5.6 % Final    Comment:    (NOTE) Pre diabetes:          5.7%-6.4%  Diabetes:              >6.4%  Glycemic control for   <7.0% adults with diabetes     CBG: Recent Labs  Lab 12/17/19 1629 12/17/19 1949 12/17/19 2313 12/18/19 0347 12/18/19 0736  GLUCAP 131* 105* 131* 122* 137*     Critical care time: 35 minutes    Noe Gens, MSN, NP-C, AGACNP-BC [facility] 12/18/2019, 9:49 AM   Please see [email or web address] for pager details.

## 2019-12-18 NOTE — Progress Notes (Signed)
RT informed RN of tube feeding color secretions from inline suction and dark red frothy secretions from mouth. RN stopped tube feed and E-Link notified. Orders obtained for KUB.

## 2019-12-18 NOTE — Progress Notes (Signed)
[facility] Progress Note Patient Name: Jeson Camacho DOB: 1950/12/03 MRN: [id number]   Date of Service  12/18/2019  HPI/Events of Note  Concern that patient may be aspirating enteral feed.  eICU Interventions  Enteral feed held and KUB ordered to verify NG tube position.        Kerry Kass Desiderio Dolata 12/18/2019, 1:06 AM

## 2019-12-18 NOTE — Progress Notes (Signed)
eLink Physician-Brief Progress Note Patient Name: Ronald Olsen DOB: 21-Nov-1950 MRN: [id number]   Date of Service  12/18/2019  HPI/Events of Note  Patient with sub-optimal blood pressure control.  eICU Interventions  Hydralazine 5-10 mg iv Q 4 hours PRN SBP  > 170 mmHg.        Ronald Olsen Ronald Olsen 12/18/2019, 12:48 AM

## 2019-12-18 NOTE — Progress Notes (Signed)
eLink Physician-Brief Progress Note Patient Name: Lovis More DOB: 14-Dec-1950 MRN: [id number]   Date of Service  12/18/2019  HPI/Events of Note  Patient with edematous extremities, BP by automated measurement is 213/203 but manual measurement is 160/90.  eICU Interventions  Order entered for insertion of arterial line as automated non-invasive blood pressure measurements are unreliable  in this patient.        Kerry Kass Sendy Pluta 12/18/2019, 10:21 PM

## 2019-12-19 ENCOUNTER — Inpatient Hospital Stay (HOSPITAL_COMMUNITY): Payer: Medicare Other

## 2019-12-19 DIAGNOSIS — J9601 Acute respiratory failure with hypoxia: Secondary | ICD-10-CM | POA: Diagnosis not present

## 2019-12-19 DIAGNOSIS — U071 COVID-19: Secondary | ICD-10-CM | POA: Diagnosis not present

## 2019-12-19 LAB — IRON AND TIBC
Iron: 41 ug/dL — ABNORMAL LOW (ref 45–182)
Saturation Ratios: 20 % (ref 17.9–39.5)
TIBC: 202 ug/dL — ABNORMAL LOW (ref 250–450)
UIBC: 161 ug/dL

## 2019-12-19 LAB — BLOOD GAS, ARTERIAL
Acid-Base Excess: 3.8 mmol/L — ABNORMAL HIGH (ref 0.0–2.0)
Bicarbonate: 31.4 mmol/L — ABNORMAL HIGH (ref 20.0–28.0)
O2 Saturation: 92.5 %
Patient temperature: 98.6
pCO2 arterial: 63.6 mmHg — ABNORMAL HIGH (ref 32.0–48.0)
pH, Arterial: 7.314 — ABNORMAL LOW (ref 7.350–7.450)
pO2, Arterial: 72 mmHg — ABNORMAL LOW (ref 83.0–108.0)

## 2019-12-19 LAB — COMPREHENSIVE METABOLIC PANEL
ALT: 141 U/L — ABNORMAL HIGH (ref 0–44)
AST: 63 U/L — ABNORMAL HIGH (ref 15–41)
Albumin: 1.9 g/dL — ABNORMAL LOW (ref 3.5–5.0)
Alkaline Phosphatase: 92 U/L (ref 38–126)
Anion gap: 13 (ref 5–15)
BUN: 137 mg/dL — ABNORMAL HIGH (ref 8–23)
CO2: 29 mmol/L (ref 22–32)
Calcium: 8.1 mg/dL — ABNORMAL LOW (ref 8.9–10.3)
Chloride: 98 mmol/L (ref 98–111)
Creatinine, Ser: 4.08 mg/dL — ABNORMAL HIGH (ref 0.61–1.24)
GFR, Estimated: 14 mL/min — ABNORMAL LOW (ref >60)
Glucose, Bld: 144 mg/dL — ABNORMAL HIGH (ref 70–99)
Potassium: 3.6 mmol/L (ref 3.5–5.1)
Sodium: 140 mmol/L (ref 135–145)
Total Bilirubin: 0.3 mg/dL (ref 0.3–1.2)
Total Protein: 6.2 g/dL — ABNORMAL LOW (ref 6.5–8.1)

## 2019-12-19 LAB — CBC
HCT: 29.4 % — ABNORMAL LOW (ref 39.0–52.0)
Hemoglobin: 9.4 g/dL — ABNORMAL LOW (ref 13.0–17.0)
MCH: 31.2 pg (ref 26.0–34.0)
MCHC: 32 g/dL (ref 30.0–36.0)
MCV: 97.7 fL (ref 80.0–100.0)
Platelets: 257 10*3/uL (ref 150–400)
RBC: 3.01 MIL/uL — ABNORMAL LOW (ref 4.22–5.81)
RDW: 13.9 % (ref 11.5–15.5)
WBC: 22.1 10*3/uL — ABNORMAL HIGH (ref 4.0–10.5)
nRBC: 0.1 % (ref 0.0–0.2)

## 2019-12-19 LAB — GLUCOSE, CAPILLARY
Glucose-Capillary: 122 mg/dL — ABNORMAL HIGH (ref 70–99)
Glucose-Capillary: 129 mg/dL — ABNORMAL HIGH (ref 70–99)
Glucose-Capillary: 134 mg/dL — ABNORMAL HIGH (ref 70–99)
Glucose-Capillary: 135 mg/dL — ABNORMAL HIGH (ref 70–99)
Glucose-Capillary: 136 mg/dL — ABNORMAL HIGH (ref 70–99)
Glucose-Capillary: 138 mg/dL — ABNORMAL HIGH (ref 70–99)
Glucose-Capillary: 151 mg/dL — ABNORMAL HIGH (ref 70–99)

## 2019-12-19 LAB — D-DIMER, QUANTITATIVE: D-Dimer, Quant: 4.57 ug/mL-FEU — ABNORMAL HIGH (ref 0.00–0.50)

## 2019-12-19 MED ORDER — CISATRACURIUM BESYLATE 20 MG/10ML IV SOLN
0.1000 mg/kg | Freq: Once | INTRAVENOUS | Status: AC
  Administered 2019-12-19: 10.2 mg via INTRAVENOUS
  Filled 2019-12-19: qty 10

## 2019-12-19 NOTE — Progress Notes (Signed)
Pharmacy Antibiotic Note  Ronald Olsen is a 69 y.o. male admitted on 11/17/2019 with nosebleed. Marland Kitchen  Pharmacy has been consulted for Unasyn dosing to cover empirically for toxic shock while nose packing in place (packing performed on 10/13).    Amoxicillin intolerance noted with prolonged courses in past causing "cloudy head".  Patient currently in medically induced coma on venitlator with plans to leave packing in place for 7 days.    Today, 12/19/2019: - Tmax 99.2, wbc up 22.1 - scr elevated but down to 4.08 (crcl~21.3)  Plan: - continue Unasyn 3gm IV q12h - F/U renal function - F/U to d/c Unasyn once nose packing removed  _________________________________________  Height: 5\' 11"  (180.3 cm) Weight: 101.2 kg (223 lb 1.7 oz) IBW/kg (Calculated) : 75.3  Temp (24hrs), Avg:98.4 F (36.9 C), Min:97.9 F (36.6 C), Max:99.2 F (37.3 C)  Recent Labs  Lab 12/15/19 0415 12/15/19 0415 12/16/19 0419 12/16/19 1129 12/17/19 0406 12/17/19 1302 12/18/19 0338 12/18/19 1146 12/19/19 0003 12/19/19 0134  WBC 13.6*  --  20.7*  --  14.8*  --  15.6*  --  22.1*  --   CREATININE 2.75*   < > 3.57*   < > 4.60* 4.19* 4.53* 4.26*  --  4.08*   < > = values in this interval not displayed.    Estimated Creatinine Clearance: 21.3 mL/min (A) (by C-G formula based on SCr of 4.08 mg/dL (H)).    Allergies  Allergen Reactions  . Amoxicillin Other (See Comments)    "Cloudy headed" with long doses    Antimicrobials this admission:  9/20 Remdesivir  > 9/24 9/20 Baricitinib >10/3 10/3 vanc>>10/4 10/4 cefepime>> 10/9 10/3 CTX> 10/4 10/3 azith>> 10/7 10/13 Unasyn>>  Dose adjustments this admission:  10/6 Cefepime 2gm q8 > q12  Microbiology results:  9/20 BCx x2: NGF 9/20 COVID+ 10/3 MRSA PCR neg 10/4 MRSA PCR neg  Thank you for allowing pharmacy to be a part of this patient's care.  Dia Sitter, PharmD, BCPS 12/19/2019 7:54 AM

## 2019-12-19 NOTE — Procedures (Signed)
Arterial Catheter Insertion Procedure Note  Ronald Olsen  [id number]  1950-12-23  Date:12/19/19  Time:1:19 AM    Provider Performing: Irven Shelling    Procedure: Insertion of Arterial Line [phone]) without US guidance  Indication(s) Blood pressure monitoring and/or need for frequent ABGs  Consent Unable to obtain consent due to emergent nature of procedure.  Anesthesia None   Time Out Verified patient identification, verified procedure, site/side was marked, verified correct patient position, special equipment/implants available, medications/allergies/relevant history reviewed, required imaging and test results available.   Sterile Technique Maximal sterile technique including full sterile barrier drape, hand hygiene, sterile gown, sterile gloves, mask, hair covering, sterile ultrasound probe cover (if used).   Procedure Description Area of catheter insertion was cleaned with chlorhexidine and draped in sterile fashion. Without real-time ultrasound guidance an arterial catheter was placed into the right radial artery.  Appropriate arterial tracings confirmed on monitor.     Complications/Tolerance None; patient tolerated the procedure well.   EBL Minimal   Specimen(s) None

## 2019-12-19 NOTE — Progress Notes (Signed)
eLink Physician-Brief Progress Note Patient Name: Karanvir Balderston DOB: Apr 10, 1950 MRN: [id number]   Date of Service  12/19/2019  HPI/Events of Note  Ventilator dyssynchrony, patient received 2 mg of Versed within the past 15 minutes.  eICU Interventions  Will order Nimbex 10 mg iv x 1        Kimbely Whiteaker U Edwina Grossberg 12/19/2019, 12:46 AM

## 2019-12-19 NOTE — Progress Notes (Addendum)
NAME:  Ronald Olsen, MRN:  [id number], DOB:  1950/08/30, LOS: 64 ADMISSION DATE:  12/04/2019, CONSULTATION DATE:  10/4 REFERRING MD:  Doreatha Martin, CHIEF COMPLAINT:  Respiratory failure in setting of COVID-19   Brief History   69 year old male patient admitted on 9/20 with Covid pneumonia. Treated with supplemental oxygen, systemic steroids, remdesivir and baricitinib. Progressive resp failure, intubated 10/5. Evolving ARF. Treated empirically for possible HCAP.   Past Medical History  Hypertension, dyslipidemia. Coronary artery disease  Significant Hospital Events   9/20  Admit, COVID positive, PCT <0.10, BNP neg. High dose steroids, Remdesivir and baricitinib.  9/21 HFNC 15 liters    9/22 CRP down 3.4 but still on high FIO2 9/25 Completed day 5 of Remdesivir. Still on high dose steroids and Baricitinib  9/26 Increased oxygen demand. Placed on NRB and high flow.  9/28 Rapid desaturation w/ any movement  9/29 CT angio negative for PE  10/4 Worsening resp failure, rapid response called. PCCM asked to see w/ tranfers to ICU 10/5 Intubated. Replaced w larger 8.0 ETT 10/5 for intermittent cuffleak. Epistaxis  10/6  Prone positioning -FiO2 0.60, PEEP 10 ,Continued oozing from nare, even after packing.  Heparin held 10/8 FiO2 increased to 60% due to desaturation 10/9 Nasal packing removed 10/11 Ongoing oozing from right nares, draining into mouth, vent dyssynchrony.  Heparin SQ held. P/F 87, prone 10/13 Nose repacked after review with ENT 10/14 Weaned to fentanyl gtt only, followed commands   Consults:  Pulmonary consulted 10/4 ENT 10/9, 10/13  Procedures:  ETT 10/5 >>  LUE PICC 10/5 >>  R Femoral ALine 10/6 >> 10/12 R Radial Aline 10/15 >>  Significant Diagnostic Tests:  9/29 CT Chest 9/29 >> multifocal groundglass attenuations bilaterally through all lobes 10/5 ECHO >> Intact LV function 29-93%, grade 1 diastolic dysfunction, normal RV function and size 10/7 LE Venous duplex >>  negative 10/12 Renal US >> unremarkable kidneys, decompressed bladder but diffuse wall thickening  Micro Data:  COVID 9/29 >> Positive BCx2 9/20 >> Negative  Antimicrobials:  Remdesivir 9/20 >> 9/25 Vanc 10/3 >> 10/6 Ceftriaxone 10/3 >> 10/4 Cefepime 10/4 >> 10/9 Azitho 10/3 >> 10/6  Interim history/subjective:  Given PRN NMB overnight for vent dyssynchrony, aline ordered for BP monitoring  Vent - 50% FiO2 / PEEP 10.  Peak 28, Pplat 25, driving pressure 15 Glucose range 122-151 Afebrile / WBC 22.1  I/O 3.7L UOP, -2L in last 24 hours  RN reports aline placed overnight, PAC's/PVC's noted this am  Objective   Blood pressure (!) 142/59, pulse 100, temperature 98.9 F (37.2 C), temperature source Oral, resp. rate (!) 35, height 5\' 11"  (1.803 m), weight 101.2 kg, SpO2 93 %. CVP:  [9 mmHg] 9 mmHg  Vent Mode: PRVC FiO2 (%):  [50 %-60 %] 50 % Set Rate:  [35 bmp] 35 bmp Vt Set:  [450 mL] 450 mL PEEP:  [8 cmH20-10 cmH20] 10 cmH20 Plateau Pressure:  [16 cmH20-30 cmH20] 30 cmH20   Intake/Output Summary (Last 24 hours) at 12/19/2019 7169 Last data filed at 12/19/2019 0600 Gross per 24 hour  Intake 1579.48 ml  Output 3790 ml  Net -2210.52 ml   Filed Weights   12/16/19 0420 12/17/19 0413 12/18/19 0500  Weight: 97 kg 102.1 kg 101.2 kg    Examination: General: adult male, critically ill appearing lying in bed in NAD HEENT: MM pink/moist, ETT, right nasal packing in place, anicteric  Neuro: sedate on versed, fentanyl CV: s1s2 rrr, no m/r/g PULM:  Non-labored, mild  dyssynchrony, clear breath sounds bilaterally  GI: soft, bsx4 active  Extremities: warm/dry, 1+BLE pitting & dependent UE edema  Skin: no rashes or lesions  Resolved Hospital Problem list   Mixed acidosis - Bicarb weaned to off 10/6 Severe leukocytosis, suspect steroid and stress  Assessment & Plan:   ARDS secondary to COVID PNA Completed remdesivir, baricitinib, steroids.  ETT with cuff leak 10/5, changed.  -low  Vt ventilation 4-8cc/kg -assess ABG  -goal plateau pressure <30, driving pressure <16 cm H2O -target PaO2 55-65, titrate PEEP/FiO2 per ARDS protocol  -if P/F ratio <150, consider prone therapy for 16 hours per day -goal CVP <4, diuresis as necessary -VAP prevention measures  -follow intermittent CXR  -reviewed with wife that he may need a trach, will revisit next week. She would like to wait the full 14 days before committing to trach  Acute Metabolic Encephalopathy  Sedation Needs while on Vent -PAD protocol with fentanyl, versed -RASS goal -3 to -4  -avoid NMB if possible   Shock In the setting of sedating medications + presumed sepsis.  Completed course of cefepime, echocardiogram 10/5 reassuring. Norepi weaned to off 10/7 -monitor hemodynamics in ICU   Leukocytosis  Afebrile, may be nasal packing  -follow WBC trend  -if fever, pan culture   CAD  Hx stent on plavix -continue plavix  -hold lipitor with elevated CK   AKI, Non-Oliguric  Hyperkalemia  Negative renal US, CK 621.  Likely ATN from hypotension.   -appreciate Nephrology input  -hold lasix 10/15 as patient making excellent UOP, negative balance. Reassess daily.  -Trend BMP / urinary output -Replace electrolytes as indicated -Avoid nephrotoxic agents, ensure adequate renal perfusion -hold lokelma, kayexalate   Epistaxis Suspect traumatic from NG tube attempt.  INR 1.4.  LE duplex negative, ECHO negative.   Enoxaparin, aspirin, Plavix held. heparin gtt stopped on 10/6.  Re-packed 10/13, on DVT ppx.  -nasal packing for 7 days, remove 10/19 -empiric unasyn for toxic shock coverage -follow Hgb trend  -PRN afrin  Macrocytic Anemia  B12 964 -follow CBC -anemia panel pending per Nephrology   Hyperglycemia Mild, suspect steroid-induced -SSI   Best practice:  Diet: NPO, TF  Pain/Anxiety/Delirium protocol (if indicated): PAD protocol  VAP protocol (if indicated): Ordered DVT prophylaxis: SCD's, heparin  restarted 10/12, see above  GI prophylaxis: PPI Glucose control: SSI    Mobility: Bedrest Code Status: DNR Family Communication: Wife [name] [phone]) updated 10/15 via phone on plan of care.  Disposition: ICU   Labs    CBC: Recent Labs  Lab 12/13/19 0400 12/13/19 0400 12/14/19 0331 12/14/19 0331 12/15/19 0415 12/16/19 0419 12/17/19 0406 12/18/19 0338 12/19/19 0003  WBC 13.6*   < > 14.6*   < > 13.6* 20.7* 14.8* 15.6* 22.1*  NEUTROABS 11.1*  --  11.6*  --  10.7* 17.9*  --   --   --   HGB 9.0*   < > 8.4*   < > 7.8* 9.1* 8.3* 8.2* 9.4*  HCT 29.6*   < > 28.9*   < > 25.8* 30.6* 27.3* 26.0* 29.4*  MCV 101.7*   < > 105.5*   < > 103.2* 104.8* 101.9* 99.6 97.7  PLT 120*   < > 139*   < > 142* 185 155 210 257   < > = values in this interval not displayed.    Basic Metabolic Panel: Recent Labs  Lab 12/13/19 0400 12/13/19 0400 12/14/19 0331 12/14/19 0331 12/15/19 0415 12/16/19 0419 12/17/19 0406 12/17/19 1302 12/18/19 8119  12/18/19 1146 12/19/19 0134  NA 147*   < > 144   < > 142   < > 142 140 137 142 140  K 4.6   < > 5.1   < > 4.8   < > 5.7* 4.9 4.5 4.3 3.6  CL 113*   < > 112*   < > 111   < > 103 101 97* 100 98  CO2 28   < > 27   < > 25   < > 27 27 29 29 29   GLUCOSE 116*   < > 124*   < > 112*   < > 107* 124* 127* 149* 144*  BUN 103*   < > 104*   < > 105*   < > 120* 147* 157* 160* 137*  CREATININE 2.46*   < > 2.92*   < > 2.75*   < > 4.60* 4.19* 4.53* 4.26* 4.08*  CALCIUM 7.0*   < > 7.1*   < > 7.1*   < > 7.9* 7.4* 7.8* 7.7* 8.1*  MG 3.2*  --  3.1*  --  2.8*  --   --   --   --   --   --   PHOS 2.6  --  3.3  --  2.8  --   --   --   --   --   --    < > = values in this interval not displayed.   GFR: Estimated Creatinine Clearance: 21.3 mL/min (A) (by C-G formula based on SCr of 4.08 mg/dL (H)). Recent Labs  Lab 12/16/19 0419 12/17/19 0406 12/18/19 0338 12/19/19 0003  WBC 20.7* 14.8* 15.6* 22.1*    Liver Function Tests: Recent Labs  Lab 12/18/19 0338  12/18/19 1146 12/19/19 0134  AST 63* 51* 63*  ALT 149* 132* 141*  ALKPHOS 76 75 92  BILITOT 0.5 0.6 0.3  PROT 5.7* 5.4* 6.2*  ALBUMIN 1.9* 1.7* 1.9*   No results for input(s): LIPASE, AMYLASE in the last 168 hours. No results for input(s): AMMONIA in the last 168 hours.  ABG    Component Value Date/Time   PHART 7.260 (L) 12/17/2019 0439   PCO2ART 66.3 (HH) 12/17/2019 0439   PO2ART 71.7 (L) 12/17/2019 0439   HCO3 28.7 (H) 12/17/2019 0439   ACIDBASEDEF 0.2 12/15/2019 0934   O2SAT 92.6 12/17/2019 0439     Coagulation Profile: No results for input(s): INR, PROTIME in the last 168 hours.  Cardiac Enzymes: Recent Labs  Lab 12/17/19 0406  CKTOTAL 621*    HbA1C: Hgb A1c MFr Bld  Date/Time Value Ref Range Status  11/25/2019 05:59 AM 5.9 (H) 4.8 - 5.6 % Final    Comment:    (NOTE) Pre diabetes:          5.7%-6.4%  Diabetes:              >6.4%  Glycemic control for   <7.0% adults with diabetes     CBG: Recent Labs  Lab 12/18/19 1552 12/18/19 2019 12/19/19 0013 12/19/19 0354 12/19/19 0805  GLUCAP 154* 133* 122* 151* 134*     Critical care time: 93 minutes    Noe Gens, MSN, NP-C, AGACNP-BC [facility] 12/19/2019, 8:22 AM   Please see [email or web address] for pager details.

## 2019-12-19 NOTE — Progress Notes (Signed)
[facility] ROUNDING NOTE   Subjective:   Brief history: Ronald Olsen is a 69 y.o. male. This is a 69 year old gentleman with COVID-19 pneumonia treated with steroids and SVN and baricitinib was intubated 03/12/4942 course complicated by epistaxis requiring nasal packing. Was admitted 11/08/2019. Does have a prior history of hypertension gastroesophageal reflux disease hyperlipidemia. Renal function abruptly declined 12/10/2019. It appears that he has been nonoliguric although weights have been positive since admission and now weighs 102 kg. Reviewing the records it appears that there was abrupt hypotension 96/09/5914 with a systolic blood pressure that dropped to less than 70 mmHg. This required the use of Neo-Synephrine. It also coincided with intubation for worsening respiratory failure Renal ultrasound 12/16/2019 showed unremarkable kidneys. The does appear the use of IV contrast on 12/03/2019 with a CT angiogram which was negative for pulmonary embolus. Vancomycin was administered 10/3 and 12/08/2019.  Urine output 5.3 L 12/17/2020  Blood pressure 116/48 pulse 84 temperature 99 O2 sats 90% FiO2 60%  Sodium 140 potassium 3.6 chloride 98 CO2 29 BUN 137 creatinine 4.08 glucose 144 calcium 8.1 albumin 1.9 hemoglobin 9.4  Chest x-ray showed bilateral interstitial heterogenous airspace opacities  Plavix 75 mg daily, Protonix 40 mg daily, Lokelma 10 g 3 times daily  Unasyn 3 g every 12 hours  Objective:  Vital signs in last 24 hours:  Temp:  [97.9 F (36.6 C)-99.2 F (37.3 C)] 99 F (37.2 C) (10/15 0400) Pulse Rate:  [86-110] 100 (10/15 0600) Resp:  [21-43] 35 (10/15 0600) BP: (142-231)/(59-203) 142/59 (10/15 0425) SpO2:  [83 %-96 %] 94 % (10/15 0600) Arterial Line BP: (99-175)/(47-72) 151/58 (10/15 0600) FiO2 (%):  [50 %-60 %] 60 % (10/15 0600)  Weight change:  Filed Weights   12/16/19 0420 12/17/19 0413 12/18/19 0500  Weight: 97 kg 102.1 kg 101.2 kg     Intake/Output: I/O last 3 completed shifts: In: 3846 [I.V.:1210.3; NG/GT:2010.1; IV Piggyback:299.6] Out: 6090 [Urine:6090]   Intake/Output this shift:  No intake/output data recorded.    General: Critically ill-appearing adult male HEENT: Mucous membranes moist pink Eyes: Pupils round equal reactive Neck: Supple no thyromegaly Heart: Regular rate and rhythm without murmurs rubs or gallops Lungs: Mechanically supported breath sounds Abdomen: Hypoactive bowel sounds Extremities: No cyanosis clubbing   trace dependent edema Skin: No rashes skin lesions Neuro: Sedated grimaces positive movements   Basic Metabolic Panel: Recent Labs  Lab 12/13/19 0400 12/13/19 0400 12/14/19 0331 12/14/19 0331 12/15/19 0415 12/16/19 0419 12/17/19 0406 12/17/19 0406 12/17/19 1302 12/17/19 1302 12/18/19 0338 12/18/19 1146 12/19/19 0134  NA 147*   < > 144   < > 142   < > 142  --  140  --  137 142 140  K 4.6   < > 5.1   < > 4.8   < > 5.7*  --  4.9  --  4.5 4.3 3.6  CL 113*   < > 112*   < > 111   < > 103  --  101  --  97* 100 98  CO2 28   < > 27   < > 25   < > 27  --  27  --  _0 GLUCOSE 116*   < > 124*   < > 112*   < > 107*  --  124*  --  127* 149* 144*  BUN 103*   < > 104*   < > 105*   < > 120*  --  147*  --  157* 160* 137*  CREATININE 2.46*   < > 2.92*   < > 2.75*   < > 4.60*  --  4.19*  --  4.53* 4.26* 4.08*  CALCIUM 7.0*   < > 7.1*   < > 7.1*   < > 7.9*   < > 7.4*   < > 7.8* 7.7* 8.1*  MG 3.2*  --  3.1*  --  2.8*  --   --   --   --   --   --   --   --   PHOS 2.6  --  3.3  --  2.8  --   --   --   --   --   --   --   --    < > = values in this interval not displayed.    Liver Function Tests: Recent Labs  Lab 12/18/19 0338 12/18/19 1146 12/19/19 0134  AST 63* 51* 63*  ALT 149* 132* 141*  ALKPHOS 76 75 92  BILITOT 0.5 0.6 0.3  PROT 5.7* 5.4* 6.2*  ALBUMIN 1.9* 1.7* 1.9*   No results for input(s): LIPASE, AMYLASE in the last 168 hours. No results for input(s): AMMONIA  in the last 168 hours.  CBC: Recent Labs  Lab 12/13/19 0400 12/13/19 0400 12/14/19 0331 12/14/19 0331 12/15/19 0415 12/16/19 0419 12/17/19 0406 12/18/19 0338 12/19/19 0003  WBC 13.6*   < > 14.6*   < > 13.6* 20.7* 14.8* 15.6* 22.1*  NEUTROABS 11.1*  --  11.6*  --  10.7* 17.9*  --   --   --   HGB 9.0*   < > 8.4*   < > 7.8* 9.1* 8.3* 8.2* 9.4*  HCT 29.6*   < > 28.9*   < > 25.8* 30.6* 27.3* 26.0* 29.4*  MCV 101.7*   < > 105.5*   < > 103.2* 104.8* 101.9* 99.6 97.7  PLT 120*   < > 139*   < > 142* 185 155 210 257   < > = values in this interval not displayed.    Cardiac Enzymes: Recent Labs  Lab 12/17/19 0406  CKTOTAL 621*    BNP: Invalid input(s): POCBNP  CBG: Recent Labs  Lab 12/18/19 1133 12/18/19 1552 12/18/19 2019 12/19/19 0013 12/19/19 0354  GLUCAP 150* 154* 133* 122* 151*    Microbiology: Results for orders placed or performed during the hospital encounter of 11/29/2019  SARS Coronavirus 2 by RT PCR (hospital order, performed in [facility] hospital lab) Nasopharyngeal Nasopharyngeal Swab     Status: Abnormal   Collection Time: 11/06/2019 10:51 AM   Specimen: Nasopharyngeal Swab  Result Value Ref Range Status   SARS Coronavirus 2 POSITIVE (A) NEGATIVE Final    Comment: RESULT CALLED TO, READ BACK BY AND VERIFIED WITH: WEST,S. RN _0  ON 9.20.2021 BY NMCCOY (NOTE) SARS-CoV-2 target nucleic acids are DETECTED  SARS-CoV-2 RNA is generally detectable in upper respiratory specimens  during the acute phase of infection.  Positive results are indicative  of the presence of the identified virus, but do not rule out bacterial infection or co-infection with other pathogens not detected by the test.  Clinical correlation with patient history and  other diagnostic information is necessary to determine patient infection status.  The expected result is negative.  Fact Sheet for Patients:   StrictlyIdeas.no   Fact Sheet for Healthcare  Providers:   BankingDealers.co.za    This test is not yet approved or cleared by the [place]  FDA and  has been authorized for detection and/or diagnosis of SARS-CoV-2 by FDA under an Emergency Use Authorization (EUA).  This EUA will remain in effect (meaning th is test can be used) for the duration of  the COVID-19 declaration under Section 564(b)(1) of the Act, 21 U.S.C. section 360-bbb-3(b)(1), unless the authorization is terminated or revoked sooner.  Performed at [facility], [place] [address], [place], [place] [address]   Blood Culture (routine x 2)     Status: None   Collection Time: 11/12/2019 10:51 AM   Specimen: BLOOD RIGHT HAND  Result Value Ref Range Status   Specimen Description   Final    BLOOD RIGHT HAND Performed at [facility], [place] [address], [place], [place] [address]    Special Requests   Final    BOTTLES DRAWN AEROBIC AND ANAEROBIC Blood Culture adequate volume Performed at [place] [address], [place], [place] [address]    Culture   Final    NO GROWTH 5 DAYS Performed at [facility], [place] [address], [place], [place] [address]    Report Status 11/29/2019 FINAL  Final  Blood Culture (routine x 2)     Status: None   Collection Time: 11/19/2019  1:10 PM   Specimen: BLOOD  Result Value Ref Range Status   Specimen Description   Final    BLOOD LEFT WRIST Performed at [place] [address], [place], [place] [address]    Special Requests   Final    BOTTLES DRAWN AEROBIC AND ANAEROBIC Blood Culture adequate volume Performed at [facility] [address] [place], [place] [address]    Culture   Final    NO GROWTH 5 DAYS Performed at [facility], [place] [address] [place], [place] [address]    Report Status 11/29/2019 FINAL  Final  MRSA PCR Screening     Status: None   Collection Time: 12/07/19  7:35 AM   Specimen: Nasal  Mucosa; Nasopharyngeal  Result Value Ref Range Status   MRSA by PCR NEGATIVE NEGATIVE Final    Comment:        The GeneXpert MRSA Assay (FDA approved for NASAL specimens only), is one component of a comprehensive MRSA colonization surveillance program. It is not intended to diagnose MRSA infection nor to guide or monitor treatment for MRSA infections. Performed at [facility], [place] [address] [place], [place] [address]   MRSA PCR Screening     Status: None   Collection Time: 12/08/19 12:11 PM   Specimen: Nasal Mucosa; Nasopharyngeal  Result Value Ref Range Status   MRSA by PCR NEGATIVE NEGATIVE Final    Comment:        The GeneXpert MRSA Assay (FDA approved for NASAL specimens only), is one component of a comprehensive MRSA colonization surveillance program. It is not intended to diagnose MRSA infection nor to guide or monitor treatment for MRSA infections. Performed at [facility], [place] [address] [place], [place] [address]     Coagulation Studies: No results for input(s): LABPROT, INR in the last 72 hours.  Urinalysis: Recent Labs    12/17/19 1540  COLORURINE YELLOW  LABSPEC 1.008  PHURINE 5.0  GLUCOSEU NEGATIVE  HGBUR LARGE*  BILIRUBINUR NEGATIVE  KETONESUR NEGATIVE  PROTEINUR 30*  NITRITE NEGATIVE  LEUKOCYTESUR SMALL*      Imaging: DG Abd 1 View  Result Date: 12/18/2019 CLINICAL DATA:  OG tube  placement EXAM: ABDOMEN - 1 VIEW. Portable AP semi erect. Left flank collimated off view. COMPARISON:  X-ray abdomen 12/11/2019, chest x-ray 12/17/2019 FINDINGS: Enteric tube coursing below diaphragm with tip and side port overlying the expected region of the gastric lumen. Tip is likely in the region of the pylorus. The bowel gas pattern is normal. Surgical clips in the upper abdomen. No radio-opaque calculi or other significant radiographic abnormality are seen. Bilateral lower thoraces demonstrate patchy airspace opacities.  IMPRESSION: 1. Enteric tube in good position. 2. Nonobstructive bowel gas pattern. 3. Bibasilar pulmonary patchy airspace opacities consistent with known COVID infection. Electronically Signed   By: Iven Finn M.D.   On: 12/18/2019 01:47   DG CHEST PORT 1 VIEW  Result Date: 12/19/2019 CLINICAL DATA:  ARDS due to COVID 19 virus. EXAM: PORTABLE CHEST 1 VIEW COMPARISON:  One-view chest x-ray 12/17/2019 FINDINGS: Heart size is normal. Endotracheal tube is stable. Enteric tube courses off the inferior border of the film. Interstitial and airspace opacities bilaterally are stable. Bilateral effusions are present. IMPRESSION: 1. Stable appearance of bilateral interstitial and airspace disease compatible with multifocal pneumonia/ARDS. 2. Stable bilateral pleural effusions. Electronically Signed   By: San Morelle M.D.   On: 12/19/2019 06:51   DG CHEST PORT 1 VIEW  Result Date: 12/17/2019 CLINICAL DATA:  Respiratory failure, COVID pneumonia EXAM: PORTABLE CHEST 1 VIEW COMPARISON:  12/17/2019 FINDINGS: No significant interval change in AP portable chest radiograph featuring diffuse bilateral interstitial and heterogeneous airspace opacity. No new airspace opacity. Support apparatus unchanged including endotracheal tube and esophagogastric tube. Left upper extremity PICC. The heart and mediastinum are unremarkable. IMPRESSION: 1. No significant interval change in AP portable chest radiograph featuring diffuse bilateral interstitial and heterogeneous airspace opacity, consistent with COVID airspace disease. No new airspace opacity. 2. Stable support apparatus. Electronically Signed   By: Eddie Candle M.D.   On: 12/17/2019 11:05     Medications:   . ampicillin-sulbactam (UNASYN) IV Stopped (12/19/19 0301)  . feeding supplement (NEPRO CARB STEADY) 55 mL/hr at 12/19/19 0020  . fentaNYL infusion INTRAVENOUS 300 mcg/hr (12/19/19 0358)  . midazolam 4 mg/hr (12/19/19 0553)   . chlorhexidine gluconate  (MEDLINE KIT)  15 mL Mouth Rinse BID  . Chlorhexidine Gluconate Cloth  6 each Topical Daily  . clopidogrel  75 mg Per Tube Daily  . docusate  100 mg Per Tube BID  . feeding supplement (PROSource TF)  90 mL Per Tube BID  . free water  300 mL Per Tube Q3H  . heparin injection (subcutaneous)  5,000 Units Subcutaneous Q8H  . insulin aspart  0-9 Units Subcutaneous Q4H  . mouth rinse  15 mL Mouth Rinse 10 times per day  . pantoprazole sodium  40 mg Per Tube Q1200  . polyethylene glycol  17 g Per Tube Daily  . sodium chloride flush  10-40 mL Intracatheter Q12H  . sodium zirconium cyclosilicate  10 g Per Tube TID   acetaminophen (TYLENOL) oral liquid 160 mg/5 mL, albuterol, fentaNYL, hydrALAZINE, midazolam, oxymetazoline, sodium chloride flush  Assessment/ Plan:  1.Acute kidney injury. Renal ultrasound did not reveal any evidence of hydronephrosis. The episode of acute kidney injury appeared to coincide with the episode of hypotension when patient request requiring intubation mechanical ventilation. Since that time patient has been in positive fluid balance. The does not appear to be any use nephrotoxins. There was the use of IV contrast but the timing of the acute kidney injury does not coincide with the use of IV  contrast. This makes this etiology unlikely. There is also the use of vancomycin although this was discontinued on 12/08/2019 there is possibility that there may be some vancomycin nephrotoxicity including acute tubular necrosis.  Urine greater than 50 RBCs 21-50 WBCs urine sodium 95.  CPK 621.  I do not think this represents acute interstitial nephritis or acute glomerulonephritis. These would be very difficult to diagnose there have been reported cases in the setting of Covid pneumonia. 2. Hypertension/volume  -patient appears to be well-hydrated and does not appear to be intravascular volume depleted. There is the presence of peripheral edema.  Renal function appears to be improving 3.  Hyperkalemia.  Resolved will discontinue Lokelma 4. Anemia we will check iron studies and evaluate for ESA. 5. Covid 19 pneumonia as per primary service. 6. Acid-base status appears to be stable    LOS: [place] _0 _1 :14 AM

## 2019-12-20 ENCOUNTER — Inpatient Hospital Stay (HOSPITAL_COMMUNITY): Payer: Medicare Other

## 2019-12-20 DIAGNOSIS — J9601 Acute respiratory failure with hypoxia: Secondary | ICD-10-CM | POA: Diagnosis not present

## 2019-12-20 DIAGNOSIS — U071 COVID-19: Secondary | ICD-10-CM | POA: Diagnosis not present

## 2019-12-20 LAB — GLUCOSE, CAPILLARY
Glucose-Capillary: 121 mg/dL — ABNORMAL HIGH (ref 70–99)
Glucose-Capillary: 121 mg/dL — ABNORMAL HIGH (ref 70–99)
Glucose-Capillary: 126 mg/dL — ABNORMAL HIGH (ref 70–99)
Glucose-Capillary: 136 mg/dL — ABNORMAL HIGH (ref 70–99)
Glucose-Capillary: 146 mg/dL — ABNORMAL HIGH (ref 70–99)
Glucose-Capillary: 146 mg/dL — ABNORMAL HIGH (ref 70–99)

## 2019-12-20 LAB — CBC
HCT: 25.9 % — ABNORMAL LOW (ref 39.0–52.0)
Hemoglobin: 8.3 g/dL — ABNORMAL LOW (ref 13.0–17.0)
MCH: 31.4 pg (ref 26.0–34.0)
MCHC: 32 g/dL (ref 30.0–36.0)
MCV: 98.1 fL (ref 80.0–100.0)
Platelets: 271 10*3/uL (ref 150–400)
RBC: 2.64 MIL/uL — ABNORMAL LOW (ref 4.22–5.81)
RDW: 14.2 % (ref 11.5–15.5)
WBC: 24.1 10*3/uL — ABNORMAL HIGH (ref 4.0–10.5)
nRBC: 0.1 % (ref 0.0–0.2)

## 2019-12-20 LAB — BASIC METABOLIC PANEL
Anion gap: 12 (ref 5–15)
BUN: 170 mg/dL — ABNORMAL HIGH (ref 8–23)
CO2: 31 mmol/L (ref 22–32)
Calcium: 8 mg/dL — ABNORMAL LOW (ref 8.9–10.3)
Chloride: 100 mmol/L (ref 98–111)
Creatinine, Ser: 3.7 mg/dL — ABNORMAL HIGH (ref 0.61–1.24)
GFR, Estimated: 16 mL/min — ABNORMAL LOW (ref >60)
Glucose, Bld: 161 mg/dL — ABNORMAL HIGH (ref 70–99)
Potassium: 3.7 mmol/L (ref 3.5–5.1)
Sodium: 143 mmol/L (ref 135–145)

## 2019-12-20 LAB — D-DIMER, QUANTITATIVE: D-Dimer, Quant: 3.2 ug/mL-FEU — ABNORMAL HIGH (ref 0.00–0.50)

## 2019-12-20 MED ORDER — SODIUM CHLORIDE 0.9 % IV SOLN
0.0000 mg/h | INTRAVENOUS | Status: DC
  Administered 2019-12-20: 5 mg/h via INTRAVENOUS
  Administered 2019-12-21: 2 mg/h via INTRAVENOUS
  Filled 2019-12-20 (×4): qty 10

## 2019-12-20 MED ORDER — DARBEPOETIN ALFA 100 MCG/0.5ML IJ SOSY
100.0000 ug | PREFILLED_SYRINGE | INTRAMUSCULAR | Status: DC
  Administered 2019-12-20 – 2019-12-27 (×2): 100 ug via SUBCUTANEOUS
  Filled 2019-12-20 (×2): qty 0.5

## 2019-12-20 NOTE — Progress Notes (Signed)
[facility] Progress Note Patient Name: Ronald Olsen DOB: September 29, 1950 MRN: [id number]   Date of Service  12/20/2019  HPI/Events of Note  Oliguria - Bladder scan with 500 mL residual.   eICU Interventions  Plan: 1. I/O Cath PRN.      Intervention Category Major Interventions: Other:  Setsuko Robins Cornelia Copa 12/20/2019, 5:01 AM

## 2019-12-20 NOTE — Progress Notes (Addendum)
NAME:  Ronald Olsen, MRN:  [id number], DOB:  06/06/1950, LOS: 53 ADMISSION DATE:  11/26/2019, CONSULTATION DATE:  10/4 REFERRING MD:  Doreatha Martin, CHIEF COMPLAINT:  Respiratory failure in setting of COVID-19   Brief History   69 year old male patient admitted on 9/20 with Covid pneumonia. Treated with supplemental oxygen, systemic steroids, remdesivir and baricitinib. Progressive resp failure, intubated 10/5. Evolving ARF. Treated empirically for possible HCAP.   Past Medical History  Hypertension, dyslipidemia. Coronary artery disease  Significant Hospital Events   9/20  Admit, COVID positive, PCT <0.10, BNP neg. High dose steroids, Remdesivir and baricitinib.  9/21 HFNC 15 liters    9/22 CRP down 3.4 but still on high FIO2 9/25 Completed day 5 of Remdesivir. Still on high dose steroids and Baricitinib  9/26 Increased oxygen demand. Placed on NRB and high flow.  9/28 Rapid desaturation w/ any movement  9/29 CT angio negative for PE  10/4 Worsening resp failure, rapid response called. PCCM asked to see w/ tranfers to ICU 10/5 Intubated. Replaced w larger 8.0 ETT 10/5 for intermittent cuffleak. Epistaxis  10/6  Prone positioning -FiO2 0.60, PEEP 10 ,Continued oozing from nare, even after packing.  Heparin held 10/8 FiO2 increased to 60% due to desaturation 10/9 Nasal packing removed 10/11 Ongoing oozing from right nares, draining into mouth, vent dyssynchrony.  Heparin SQ held. P/F 87, prone 10/13 Nose repacked after review with ENT 10/14 Weaned to fentanyl gtt only, followed commands   Consults:  Pulmonary consulted 10/4 ENT 10/9, 10/13  Procedures:  ETT 10/5 >>  LUE PICC 10/5 >>  R Femoral ALine 10/6 >> 10/12 R Radial Aline 10/15 >>  Significant Diagnostic Tests:  9/29 CT Chest 9/29 >> multifocal groundglass attenuations bilaterally through all lobes 10/5 ECHO >> Intact LV function 19-41%, grade 1 diastolic dysfunction, normal RV function and size 10/7 LE Venous duplex >>  negative 10/12 Renal US >> unremarkable kidneys, decompressed bladder but diffuse wall thickening  Micro Data:  COVID 9/29 >> Positive BCx2 9/20 >> Negative  Antimicrobials:  Remdesivir 9/20 >> 9/25 Vanc 10/3 >> 10/6 Ceftriaxone 10/3 >> 10/4 Cefepime 10/4 >> 10/9 Azitho 10/3 >> 10/6  Interim history/subjective:  No issues overnight Continues on 60% FiO2 and 10 of PEEP No significant epistaxis noted.  Objective   Blood pressure (!) 145/62, pulse (!) 109, temperature 98.8 F (37.1 C), temperature source Axillary, resp. rate (!) 32, height 5\' 11"  (1.803 m), weight 102.8 kg, SpO2 92 %. CVP:  [7 mmHg] 7 mmHg  Vent Mode: PRVC FiO2 (%):  [50 %-60 %] 60 % Set Rate:  [35 bmp] 35 bmp Vt Set:  [450 mL] 450 mL PEEP:  [10 cmH20] 10 cmH20 Plateau Pressure:  [29 cmH20-33 cmH20] 29 cmH20   Intake/Output Summary (Last 24 hours) at 12/20/2019 0810 Last data filed at 12/20/2019 0442 Gross per 24 hour  Intake 953.66 ml  Output 1575 ml  Net -621.34 ml   Filed Weights   12/17/19 0413 12/18/19 0500 12/20/19 0444  Weight: 102.1 kg 101.2 kg 102.8 kg    Examination: Gen:      No acute distress HEENT:  EOMI, sclera anicteric Neck:     No masses; no thyromegaly, ETT Lungs:    Clear to auscultation bilaterally; normal respiratory effort CV:         Regular rate and rhythm; no murmurs Abd:      + bowel sounds; soft, non-tender; no palpable masses, no distension Ext:    No edema; adequate peripheral perfusion  Skin:      Warm and dry; no rash Neuro: Sedated, unresponsive  Labs/imaging reviewed Significant for glucose 161, BUN/creatinine 170/3.7 WBC 24.1, hemoglobin 8.3, platelets 271 Chest x-ray 10/16-stable bilateral infiltrates.  Support apparatus in stable position  Resolved Hospital Problem list   Mixed acidosis - Bicarb weaned to off 10/6 Severe leukocytosis, suspect steroid and stress  Assessment & Plan:   ARDS secondary to COVID PNA Completed remdesivir, baricitinib, steroids.   ETT with cuff leak 10/5, changed.  Continue low tidal volume ventilation Follow chest x-ray, ABG Wean FiO2 and PEEP as tolerated -reviewed with wife that he may need a trach, will revisit next week. She would like to wait the full 14 days before committing to trach  Acute Metabolic Encephalopathy  Sedation Needs while on Vent -PAD protocol with fentanyl, versed -RASS goal -3 to -4  -avoid NMB if possible   Shock In the setting of sedating medications + presumed sepsis.  Completed course of cefepime, echocardiogram 10/5 reassuring. Norepi weaned to off 10/7 -monitor hemodynamics in ICU   Leukocytosis  Afebrile, may be nasal packing  -follow WBC trend  -if fever, pan culture   CAD  Hx stent on plavix -continue plavix  -hold lipitor with elevated CK   AKI, Non-Oliguric  Hyperkalemia > resolved Negative renal US, CK 621.  Likely ATN from hypotension.   -appreciate Nephrology input  -Continue to hold Lasix as he is making good urine with negative balance -Trend BMP / urinary output -Replace electrolytes as indicated -Avoid nephrotoxic agents, ensure adequate renal perfusion  Epistaxis Suspect traumatic from NG tube attempt.  INR 1.4.  LE duplex negative, ECHO negative.   Enoxaparin, aspirin, Plavix held. heparin gtt stopped on 10/6.  Re-packed 10/13, on DVT ppx.  -nasal packing for 7 days, remove 10/19 -empiric unasyn for toxic shock coverage -follow Hgb trend  -PRN afrin  Macrocytic Anemia  B12 964 -follow CBC -anemia panel pending per Nephrology   Hyperglycemia Mild, suspect steroid-induced -SSI   Best practice:  Diet: NPO, TF  Pain/Anxiety/Delirium protocol (if indicated): PAD protocol  VAP protocol (if indicated): Ordered DVT prophylaxis: SCD's, heparin restarted 10/12, see above  GI prophylaxis: PPI Glucose control: SSI    Mobility: Bedrest Code Status: DNR Family Communication: Wife [name] [phone]) updated 10/16 via phone on plan of care.    Disposition: ICU   Critical care time:    The patient is critically ill with multiple organ system failure and requires high complexity decision making for assessment and support, frequent evaluation and titration of therapies, advanced monitoring, review of radiographic studies and interpretation of complex data.   Critical Care Time devoted to patient care services, exclusive of separately billable procedures, described in this note is 35 minutes.   Marshell Garfinkel MD [facility] Please see [email or web address] for pager details.  12/20/2019, 8:22 AM

## 2019-12-20 NOTE — Progress Notes (Signed)
[facility] ROUNDING NOTE   Subjective:   Brief history: Ronald Olsen is a 69 y.o. male. This is a 69 year old gentleman with COVID-19 pneumonia treated with steroids and SVN and baricitinib was intubated 54/0/0867 course complicated by epistaxis requiring nasal packing. Was admitted 11/13/2019. Does have a prior history of hypertension gastroesophageal reflux disease hyperlipidemia. Renal function abruptly declined 12/10/2019. It appears that he has been nonoliguric although weights have been positive since admission and now weighs 102 kg. Reviewing the records it appears that there was abrupt hypotension 61/11/5091 with a systolic blood pressure that dropped to less than 70 mmHg. This required the use of Neo-Synephrine. It also coincided with intubation for worsening respiratory failure Renal ultrasound 12/16/2019 showed unremarkable kidneys. The does appear the use of IV contrast on 12/03/2019 with a CT angiogram which was negative for pulmonary embolus. Vancomycin was administered 10/3 and 12/08/2019.  Urine output 1.97 12/19/2019  Blood pressure 121/52 pulse 102 temperature 99.2 O2 sats 95% FiO2 60%  Sodium 143 potassium 3.7 chloride 100 CO2 31 BUN 170 creatinine 3.7 glucose 161 calcium 8 hemoglobin 8.3  Chest x-ray showed bilateral interstitial heterogenous airspace opacities  Plavix 75 mg daily, Protonix 40 mg daily,   Unasyn 3 g every 12 hours  Objective:  Vital signs in last 24 hours:  Temp:  [98 F (36.7 C)-99.2 F (37.3 C)] 99.2 F (37.3 C) (10/16 0739) Pulse Rate:  [79-123] 109 (10/16 0600) Resp:  [25-42] 32 (10/16 0600) BP: (145)/(62) 145/62 (10/15 2005) SpO2:  [81 %-94 %] 92 % (10/16 0739) Arterial Line BP: (123-182)/(52-72) 135/61 (10/16 0200) FiO2 (%):  [50 %-60 %] 60 % (10/16 0739) Weight:  [102.8 kg] 102.8 kg (10/16 0444)  Weight change:  Filed Weights   12/17/19 0413 12/18/19 0500 12/20/19 0444  Weight: 102.1 kg 101.2 kg 102.8 kg     Intake/Output: I/O last 3 completed shifts: In: 2010.4 [I.V.:681.9; NG/GT:1155; IV Piggyback:173.5] Out: 2790 [Urine:2790]   Intake/Output this shift:  No intake/output data recorded.    General: Critically ill-appearing adult male HEENT: Mucous membranes moist pink Eyes: Pupils round equal reactive Neck: Supple no thyromegaly Heart: Regular rate and rhythm without murmurs rubs or gallops Lungs: Mechanically supported breath sounds Abdomen: Hypoactive bowel sounds Extremities: No cyanosis clubbing   trace dependent edema Skin: No rashes skin lesions Neuro: Sedated grimaces positive movements   Basic Metabolic Panel: Recent Labs  Lab 12/14/19 0331 12/14/19 0331 12/15/19 0415 12/16/19 0419 12/17/19 1302 12/17/19 1302 12/18/19 0338 12/18/19 0338 12/18/19 1146 12/19/19 0134 12/20/19 0420  NA 144   < > 142   < > 140  --  137  --  142 140 143  K 5.1   < > 4.8   < > 4.9  --  4.5  --  4.3 3.6 3.7  CL 112*   < > 111   < > 101  --  97*  --  100 98 100  CO2 27   < > 25   < > 27  --  29  --  _0 GLUCOSE 124*   < > 112*   < > 124*  --  127*  --  149* 144* 161*  BUN 104*   < > 105*   < > 147*  --  157*  --  160* 137* 170*  CREATININE 2.92*   < > 2.75*   < > 4.19*  --  4.53*  --  4.26* 4.08* 3.70*  CALCIUM 7.1*   < >  7.1*   < > 7.4*   < > 7.8*   < > 7.7* 8.1* 8.0*  MG 3.1*  --  2.8*  --   --   --   --   --   --   --   --   PHOS 3.3  --  2.8  --   --   --   --   --   --   --   --    < > = values in this interval not displayed.    Liver Function Tests: Recent Labs  Lab 12/18/19 0338 12/18/19 1146 12/19/19 0134  AST 63* 51* 63*  ALT 149* 132* 141*  ALKPHOS 76 75 92  BILITOT 0.5 0.6 0.3  PROT 5.7* 5.4* 6.2*  ALBUMIN 1.9* 1.7* 1.9*   No results for input(s): LIPASE, AMYLASE in the last 168 hours. No results for input(s): AMMONIA in the last 168 hours.  CBC: Recent Labs  Lab 12/14/19 0331 12/14/19 0331 12/15/19 0415 12/15/19 0415 12/16/19 0419  12/17/19 0406 12/18/19 0338 12/19/19 0003 12/20/19 0420  WBC 14.6*   < > 13.6*   < > 20.7* 14.8* 15.6* 22.1* 24.1*  NEUTROABS 11.6*  --  10.7*  --  17.9*  --   --   --   --   HGB 8.4*   < > 7.8*   < > 9.1* 8.3* 8.2* 9.4* 8.3*  HCT 28.9*   < > 25.8*   < > 30.6* 27.3* 26.0* 29.4* 25.9*  MCV 105.5*   < > 103.2*   < > 104.8* 101.9* 99.6 97.7 98.1  PLT 139*   < > 142*   < > 185 155 210 257 271   < > = values in this interval not displayed.    Cardiac Enzymes: Recent Labs  Lab 12/17/19 0406  CKTOTAL 621*    BNP: Invalid input(s): POCBNP  CBG: Recent Labs  Lab 12/19/19 1524 12/19/19 1925 12/19/19 2320 12/20/19 0314 12/20/19 0733  GLUCAP 135* 138* 136* 126* 146*    Microbiology: Results for orders placed or performed during the hospital encounter of 11/27/2019  SARS Coronavirus 2 by RT PCR (hospital order, performed in [place] hospital lab) Nasopharyngeal Nasopharyngeal Swab     Status: Abnormal   Collection Time: 11/30/2019 10:51 AM   Specimen: Nasopharyngeal Swab  Result Value Ref Range Status   SARS Coronavirus 2 POSITIVE (A) NEGATIVE Final    Comment: RESULT CALLED TO, READ BACK BY AND VERIFIED WITH: WEST,S. RN _0  ON 9.20.2021 BY NMCCOY (NOTE) SARS-CoV-2 target nucleic acids are DETECTED  SARS-CoV-2 RNA is generally detectable in upper respiratory specimens  during the acute phase of infection.  Positive results are indicative  of the presence of the identified virus, but do not rule out bacterial infection or co-infection with other pathogens not detected by the test.  Clinical correlation with patient history and  other diagnostic information is necessary to determine patient infection status.  The expected result is negative.  Fact Sheet for Patients:   StrictlyIdeas.no   Fact Sheet for Healthcare Providers:   BankingDealers.co.za    This test is not yet approved or cleared by the [place] FDA and  has  been authorized for detection and/or diagnosis of SARS-CoV-2 by FDA under an Emergency Use Authorization (EUA).  This EUA will remain in effect (meaning th is test can be used) for the duration of  the COVID-19 declaration under Section 564(b)(1) of the Act, 21 U.S.C. section 360-bbb-3(b)(1), unless  the authorization is terminated or revoked sooner.  Performed at [facility], [place] [address] [place], [place] [address]   Blood Culture (routine x 2)     Status: None   Collection Time: 11/20/2019 10:51 AM   Specimen: BLOOD RIGHT HAND  Result Value Ref Range Status   Specimen Description   Final    BLOOD RIGHT HAND Performed at [facility], [place] [address] [place], [place] [address]    Special Requests   Final    BOTTLES DRAWN AEROBIC AND ANAEROBIC Blood Culture adequate volume Performed at [place] [address], [place], [place] [address]    Culture   Final    NO GROWTH 5 DAYS Performed at [facility], [place] [address] [place], [place] [address]    Report Status 11/29/2019 FINAL  Final  Blood Culture (routine x 2)     Status: None   Collection Time: 11/10/2019  1:10 PM   Specimen: BLOOD  Result Value Ref Range Status   Specimen Description   Final    BLOOD LEFT WRIST Performed at [facility] [address] [place], [place] [address]    Special Requests   Final    BOTTLES DRAWN AEROBIC AND ANAEROBIC Blood Culture adequate volume Performed at [facility] [address] [place], [place] [address]    Culture   Final    NO GROWTH 5 DAYS Performed at [facility], [place] [address], [place], [place] [address]    Report Status 11/29/2019 FINAL  Final  MRSA PCR Screening     Status: None   Collection Time: 12/07/19  7:35 AM   Specimen: Nasal Mucosa; Nasopharyngeal  Result Value Ref Range Status   MRSA by PCR NEGATIVE NEGATIVE Final    Comment:        The GeneXpert MRSA Assay  (FDA approved for NASAL specimens only), is one component of a comprehensive MRSA colonization surveillance program. It is not intended to diagnose MRSA infection nor to guide or monitor treatment for MRSA infections. Performed at [facility], [place] [address], [place], [place] [address]   MRSA PCR Screening     Status: None   Collection Time: 12/08/19 12:11 PM   Specimen: Nasal Mucosa; Nasopharyngeal  Result Value Ref Range Status   MRSA by PCR NEGATIVE NEGATIVE Final    Comment:        The GeneXpert MRSA Assay (FDA approved for NASAL specimens only), is one component of a comprehensive MRSA colonization surveillance program. It is not intended to diagnose MRSA infection nor to guide or monitor treatment for MRSA infections. Performed at [facility], [place] [address] [place], [place] [address]     Coagulation Studies: No results for input(s): LABPROT, INR in the last 72 hours.  Urinalysis: Recent Labs    12/17/19 1540  COLORURINE YELLOW  LABSPEC 1.008  PHURINE 5.0  GLUCOSEU NEGATIVE  HGBUR LARGE*  BILIRUBINUR NEGATIVE  KETONESUR NEGATIVE  PROTEINUR 30*  NITRITE NEGATIVE  LEUKOCYTESUR SMALL*      Imaging: DG CHEST PORT 1 VIEW  Result Date: 12/20/2019 CLINICAL DATA:  ARDS due to COVID-19 EXAM: PORTABLE CHEST 1 VIEW COMPARISON:  12/19/2019 FINDINGS: Multifocal pneumonia with relative sparing of the left lung apex, unchanged. No pleural effusion or pneumothorax. Endotracheal tube terminates 3.5 cm above the carina. Left arm PICC likely terminates in the azygos vein. Endotracheal tube courses into the stomach. IMPRESSION: Multifocal  pneumonia in this patient with known COVID, unchanged. Endotracheal tube terminates 3.5 cm above the carina. Left arm PICC likely terminates in the azygos vein. Electronically Signed   By: Julian Hy M.D.   On: 12/20/2019 06:35   DG CHEST PORT 1 VIEW  Result Date: 12/19/2019 CLINICAL DATA:   ARDS due to COVID 19 virus. EXAM: PORTABLE CHEST 1 VIEW COMPARISON:  One-view chest x-ray 12/17/2019 FINDINGS: Heart size is normal. Endotracheal tube is stable. Enteric tube courses off the inferior border of the film. Interstitial and airspace opacities bilaterally are stable. Bilateral effusions are present. IMPRESSION: 1. Stable appearance of bilateral interstitial and airspace disease compatible with multifocal pneumonia/ARDS. 2. Stable bilateral pleural effusions. Electronically Signed   By: San Morelle M.D.   On: 12/19/2019 06:51     Medications:   . ampicillin-sulbactam (UNASYN) IV Stopped (12/20/19 0255)  . feeding supplement (NEPRO CARB STEADY) 1,000 mL (12/19/19 1327)  . fentaNYL infusion INTRAVENOUS 350 mcg/hr (12/20/19 0100)  . midazolam     . chlorhexidine gluconate (MEDLINE KIT)  15 mL Mouth Rinse BID  . Chlorhexidine Gluconate Cloth  6 each Topical Daily  . clopidogrel  75 mg Per Tube Daily  . docusate  100 mg Per Tube BID  . feeding supplement (PROSource TF)  90 mL Per Tube BID  . free water  300 mL Per Tube Q3H  . heparin injection (subcutaneous)  5,000 Units Subcutaneous Q8H  . insulin aspart  0-9 Units Subcutaneous Q4H  . mouth rinse  15 mL Mouth Rinse 10 times per day  . pantoprazole sodium  40 mg Per Tube Q1200  . polyethylene glycol  17 g Per Tube Daily  . sodium chloride flush  10-40 mL Intracatheter Q12H   acetaminophen (TYLENOL) oral liquid 160 mg/5 mL, albuterol, fentaNYL, hydrALAZINE, midazolam, oxymetazoline, sodium chloride flush  Assessment/ Plan:  1.Acute kidney injury. Renal ultrasound did not reveal any evidence of hydronephrosis. The episode of acute kidney injury appeared to coincide with the episode of hypotension when patient request requiring intubation mechanical ventilation. Since that time patient has been in positive fluid balance. The does not appear to be any use nephrotoxins. There was the use of IV contrast but the timing of the acute  kidney injury does not coincide with the use of IV contrast. This makes this etiology unlikely. There is also the use of vancomycin although this was discontinued on 12/08/2019 there is possibility that there may be some vancomycin nephrotoxicity including acute tubular necrosis.  Urine greater than 50 RBCs 21-50 WBCs urine sodium 95.  CPK 621.  I do not think this represents acute interstitial nephritis or acute glomerulonephritis. These would be very difficult to diagnose there have been reported cases in the setting of Covid pneumonia. 2. Hypertension/volume  -patient appears to be well-hydrated and does not appear to be intravascular volume depleted. There is the presence of peripheral edema.  Renal function appears to be improving 3. Hyperkalemia.  Resolved   4. Anemia we will check iron studies and evaluate for ESA.  Iron saturations 20%.  Will initiate ESA darbepoetin 100 mcg weekly 5. Covid 19 pneumonia as per primary service. 6. Acid-base status appears to be stable    LOS: [place] _0 _1 :51 AM

## 2019-12-21 ENCOUNTER — Inpatient Hospital Stay (HOSPITAL_COMMUNITY): Payer: Medicare Other

## 2019-12-21 DIAGNOSIS — U071 COVID-19: Secondary | ICD-10-CM | POA: Diagnosis not present

## 2019-12-21 DIAGNOSIS — J9601 Acute respiratory failure with hypoxia: Secondary | ICD-10-CM | POA: Diagnosis not present

## 2019-12-21 LAB — CBC
HCT: 22.7 % — ABNORMAL LOW (ref 39.0–52.0)
Hemoglobin: 7.1 g/dL — ABNORMAL LOW (ref 13.0–17.0)
MCH: 32.1 pg (ref 26.0–34.0)
MCHC: 31.3 g/dL (ref 30.0–36.0)
MCV: 102.7 fL — ABNORMAL HIGH (ref 80.0–100.0)
Platelets: 242 10*3/uL (ref 150–400)
RBC: 2.21 MIL/uL — ABNORMAL LOW (ref 4.22–5.81)
RDW: 14.8 % (ref 11.5–15.5)
WBC: 18.5 10*3/uL — ABNORMAL HIGH (ref 4.0–10.5)
nRBC: 0.2 % (ref 0.0–0.2)

## 2019-12-21 LAB — BASIC METABOLIC PANEL
Anion gap: 8 (ref 5–15)
BUN: 143 mg/dL — ABNORMAL HIGH (ref 8–23)
CO2: 26 mmol/L (ref 22–32)
Calcium: 6.4 mg/dL — CL (ref 8.9–10.3)
Chloride: 110 mmol/L (ref 98–111)
Creatinine, Ser: 2.94 mg/dL — ABNORMAL HIGH (ref 0.61–1.24)
GFR, Estimated: 21 mL/min — ABNORMAL LOW (ref >60)
Glucose, Bld: 110 mg/dL — ABNORMAL HIGH (ref 70–99)
Potassium: 3 mmol/L — ABNORMAL LOW (ref 3.5–5.1)
Sodium: 144 mmol/L (ref 135–145)

## 2019-12-21 LAB — GLUCOSE, CAPILLARY
Glucose-Capillary: 117 mg/dL — ABNORMAL HIGH (ref 70–99)
Glucose-Capillary: 148 mg/dL — ABNORMAL HIGH (ref 70–99)
Glucose-Capillary: 143 mg/dL — ABNORMAL HIGH (ref 70–99)
Glucose-Capillary: 151 mg/dL — ABNORMAL HIGH (ref 70–99)

## 2019-12-21 LAB — MAGNESIUM: Magnesium: 2.2 mg/dL (ref 1.7–2.4)

## 2019-12-21 LAB — D-DIMER, QUANTITATIVE: D-Dimer, Quant: 2.45 ug/mL-FEU — ABNORMAL HIGH (ref 0.00–0.50)

## 2019-12-21 LAB — PHOSPHORUS: Phosphorus: 5.6 mg/dL — ABNORMAL HIGH (ref 2.5–4.6)

## 2019-12-21 MED ORDER — CALCIUM GLUCONATE-NACL 2-0.675 GM/100ML-% IV SOLN
2.0000 g | Freq: Once | INTRAVENOUS | Status: AC
  Administered 2019-12-21: 2000 mg via INTRAVENOUS
  Filled 2019-12-21: qty 100

## 2019-12-21 MED ORDER — POTASSIUM CHLORIDE 10 MEQ/50ML IV SOLN
10.0000 meq | INTRAVENOUS | Status: AC
  Administered 2019-12-21 (×4): 10 meq via INTRAVENOUS
  Filled 2019-12-21 (×4): qty 50

## 2019-12-21 NOTE — Progress Notes (Signed)
[facility] Progress Note Patient Name: Ronald Olsen DOB: [birth year] MRN: [id number]   Date of Service  12/21/2019  HPI/Events of Note  Hypokalemia /  Hypocalcemia - K+ = 3.0, Ca++ = 6.4 and Creatinine = 2.94.  eICU Interventions  Will replace Ca++ and K+.      Intervention Category Major Interventions: Electrolyte abnormality - evaluation and management  Gerardo Caiazzo [place] 12/21/2019, 5:12 AM

## 2019-12-21 NOTE — Progress Notes (Signed)
CRITICAL VALUE ALERT  Critical Value:  Calcium at 6.4  Date & Time Notied:  12/21/19 @ 0439  Provider Notified: Warren Lacy called  Orders Received/Actions taken: MD aware, RN awaiting orders

## 2019-12-21 NOTE — Progress Notes (Addendum)
NAME:  Ronald Olsen, MRN:  [id number], DOB:  [birth year], LOS: 38 ADMISSION DATE:  11/16/2019, CONSULTATION DATE:  10/4 REFERRING MD:  Doreatha Martin, CHIEF COMPLAINT:  Respiratory failure in setting of COVID-19   Brief History   69 year old male patient admitted on 9/20 with Covid pneumonia. Treated with supplemental oxygen, systemic steroids, remdesivir and baricitinib. Progressive resp failure, intubated 10/5. Evolving ARF. Treated empirically for possible HCAP.   Past Medical History  Hypertension, dyslipidemia. Coronary artery disease  Significant Hospital Events   9/20  Admit, COVID positive, PCT <0.10, BNP neg. High dose steroids, Remdesivir and baricitinib.  9/21 HFNC 15 liters    9/22 CRP down 3.4 but still on high FIO2 9/25 Completed day 5 of Remdesivir. Still on high dose steroids and Baricitinib  9/26 Increased oxygen demand. Placed on NRB and high flow.  9/28 Rapid desaturation w/ any movement  9/29 CT angio negative for PE  10/4 Worsening resp failure, rapid response called. PCCM asked to see w/ tranfers to ICU 10/5 Intubated. Replaced w larger 8.0 ETT 10/5 for intermittent cuffleak. Epistaxis  10/6  Prone positioning -FiO2 0.60, PEEP 10 ,Continued oozing from nare, even after packing.  Heparin held 10/8 FiO2 increased to 60% due to desaturation 10/9 Nasal packing removed 10/11 Ongoing oozing from right nares, draining into mouth, vent dyssynchrony.  Heparin SQ held. P/F 87, prone 10/13 Nose repacked after review with ENT 10/14 Weaned to fentanyl gtt only, followed commands   Consults:  Pulmonary consulted 10/4 ENT 10/9, 10/13  Procedures:  ETT 10/5 >>  LUE PICC 10/5 >>  R Femoral ALine 10/6 >> 10/12 R Radial Aline 10/15 >>  Significant Diagnostic Tests:  9/29 CT Chest 9/29 >> multifocal groundglass attenuations bilaterally through all lobes 10/5 ECHO >> Intact LV function 75-17%, grade 1 diastolic dysfunction, normal RV function and size 10/7 LE Venous duplex >>  negative 10/12 Renal US >> unremarkable kidneys, decompressed bladder but diffuse wall thickening  Micro Data:  COVID 9/29 >> Positive BCx2 9/20 >> Negative  Antimicrobials:  Remdesivir 9/20 >> 9/25 Vanc 10/3 >> 10/6 Ceftriaxone 10/3 >> 10/4 Cefepime 10/4 >> 10/9 Azitho 10/3 >> 10/6  Interim history/subjective:   No acute issues overnight. Sedation is weaning down  Objective   Blood pressure (!) 145/62, pulse 84, temperature 97.8 F (36.6 C), temperature source Axillary, resp. rate (!) 35, height 5\' 11"  (1.803 m), weight 104.5 kg, SpO2 (!) 85 %. CVP:  [7 mmHg-12 mmHg] 7 mmHg  Vent Mode: PRVC FiO2 (%):  [40 %-50 %] 40 % Set Rate:  [35 bmp] 35 bmp Vt Set:  [450 mL] 450 mL PEEP:  [10 cmH20] 10 cmH20 Plateau Pressure:  [23 cmH20-32 cmH20] 26 cmH20   Intake/Output Summary (Last 24 hours) at 12/21/2019 0910 Last data filed at 12/21/2019 0830 Gross per 24 hour  Intake 2277.7 ml  Output 1620 ml  Net 657.7 ml   Filed Weights   12/18/19 0500 12/20/19 0444 12/21/19 0408  Weight: 101.2 kg 102.8 kg 104.5 kg    Examination: Gen:      No acute distress HEENT:  EOMI, sclera anicteric Neck:     No masses; no thyromegaly, ETT Lungs:    Clear to auscultation bilaterally; normal respiratory effort CV:         Regular rate and rhythm; no murmurs Abd:      + bowel sounds; soft, non-tender; no palpable masses, no distension Ext:    No edema; adequate peripheral perfusion Skin:  Warm and dry; no rash Neuro: Somnolent  Labs/imaging reviewed Potassium 3, creatinine improving to 2.94 WBC 18.5, hemoglobin 7.1 Chest x-ray today with stable position of support apparatus unchanged bilateral airspace disease.  Resolved Hospital Problem list   Mixed acidosis - Bicarb weaned to off 10/6 Severe leukocytosis, suspect steroid and stress  Assessment & Plan:   ARDS secondary to COVID PNA Completed remdesivir, baricitinib, steroids.  ETT with cuff leak 10/5, changed.  Continue low tidal  volume ventilation Follow intermittent chest x-ray Wean FiO2 and PEEP as tolerated  Acute Metabolic Encephalopathy  Sedation Needs while on Vent Weaning sedation.  Shock In the setting of sedating medications + presumed sepsis.  Completed course of cefepime, echocardiogram 10/5 reassuring. Norepi weaned to off 10/7 -monitor hemodynamics in ICU   Leukocytosis > improving Afebrile, may be nasal packing  Follow CBC  CAD  Hx stent on plavix -continue plavix  -hold lipitor with elevated CK   AKI, Non-Oliguric  Hyperkalemia > now hypokalemic Negative renal US, CK 621.  Likely ATN from hypotension.   Renal function continues to improve Replete electrolytes  Epistaxis Suspect traumatic from NG tube attempt.  INR 1.4.  LE duplex negative, ECHO negative.   Enoxaparin, aspirin, Plavix held. heparin gtt stopped on 10/6.  Re-packed 10/13, on DVT ppx.  -nasal packing for 7 days, remove 10/19 -empiric unasyn for toxic shock coverage -follow Hgb trend  -PRN afrin  Macrocytic Anemia  B12 964 Follow CBC  Hyperglycemia Mild, suspect steroid-induced -SSI   Best practice:  Diet: NPO, TF  Pain/Anxiety/Delirium protocol (if indicated): PAD protocol  VAP protocol (if indicated): Ordered DVT prophylaxis: SCD's, heparin restarted 10/12, see above  GI prophylaxis: PPI Glucose control: SSI    Mobility: Bedrest Code Status: DNR Family Communication: Wife Ronald Olsen [phone]) updated 10/17 via phone on plan of care.  Disposition: ICU   Critical care time:    The patient is critically ill with multiple organ system failure and requires high complexity decision making for assessment and support, frequent evaluation and titration of therapies, advanced monitoring, review of radiographic studies and interpretation of complex data.   Critical Care Time devoted to patient care services, exclusive of separately billable procedures, described in this note is 35 minutes.   Marshell Garfinkel  MD [facility] Please see [email or web address] for pager details.  12/21/2019, 9:10 AM

## 2019-12-21 NOTE — Progress Notes (Signed)
[facility] ROUNDING NOTE   Subjective:   Brief history: Ronald Olsen is a 69 y.o. male. This is a 69 year old gentleman with COVID-19 pneumonia treated with steroids and SVN and baricitinib was intubated 67/10/9379 course complicated by epistaxis requiring nasal packing. Was admitted 11/14/2019. Does have a prior history of hypertension gastroesophageal reflux disease hyperlipidemia. Renal function abruptly declined 12/10/2019. It appears that he has been nonoliguric although weights have been positive since admission and now weighs 102 kg. Reviewing the records it appears that there was abrupt hypotension 03/12/5100 with a systolic blood pressure that dropped to less than 70 mmHg. This required the use of Neo-Synephrine. It also coincided with intubation for worsening respiratory failure Renal ultrasound 12/16/2019 showed unremarkable kidneys. The does appear the use of IV contrast on 12/03/2019 with a CT angiogram which was negative for pulmonary embolus. Vancomycin was administered 10/3 and 12/08/2019.  Urine output 1 L 12/20/2019.  Urine output improving.  Blood pressure 121/56 pulse 81 temperature 99.2 O2 sats 93% FiO2 50%  Sodium 144 potassium 3 chloride 111 CO2 26 BUN 143 creatinine 2.94 glucose 110 calcium 6.4 phosphorus 5.6 magnesium 2.2 hemoglobin 7.1 albumin 1.9  Chest x-ray showed bilateral interstitial heterogenous airspace opacities  Plavix 75 mg daily, Protonix 40 mg daily,   Unasyn 3 g every 12 hours  Objective:  Vital signs in last 24 hours:  Temp:  [97.8 F (36.6 C)-99.2 F (37.3 C)] 99.2 F (37.3 C) (10/17 0400) Pulse Rate:  [76-103] 79 (10/17 0700) Resp:  [26-36] 35 (10/17 0700) SpO2:  [88 %-96 %] 92 % (10/17 0700) Arterial Line BP: (103-132)/(48-58) 115/54 (10/17 0700) FiO2 (%):  [50 %] 50 % (10/17 0400) Weight:  [104.5 kg] 104.5 kg (10/17 0408)  Weight change: 1.7 kg Filed Weights   12/18/19 0500 12/20/19 0444 12/21/19 0408  Weight: 101.2  kg 102.8 kg 104.5 kg    Intake/Output: I/O last 3 completed shifts: In: 1164.5 [I.V.:938.1; IV Piggyback:226.5] Out: 1800 [Urine:1800]   Intake/Output this shift:  No intake/output data recorded.    General: Critically ill-appearing adult male HEENT: Mucous membranes moist pink Eyes: Pupils round equal reactive Neck: Supple no thyromegaly Heart: Regular rate and rhythm without murmurs rubs or gallops Lungs: Mechanically supported breath sounds Abdomen: Hypoactive bowel sounds Extremities: No cyanosis clubbing   trace dependent edema Skin: No rashes skin lesions Neuro: Sedated grimaces positive movements   Basic Metabolic Panel: Recent Labs  Lab 12/15/19 0415 12/16/19 0419 12/18/19 0338 12/18/19 0338 12/18/19 1146 12/18/19 1146 12/19/19 0134 12/20/19 0420 12/21/19 0400  NA 142   < > 137  --  142  --  140 143 144  K 4.8   < > 4.5  --  4.3  --  3.6 3.7 3.0*  CL 111   < > 97*  --  100  --  98 100 110  CO2 25   < > 29  --  29  --  _0 GLUCOSE 112*   < > 127*  --  149*  --  144* 161* 110*  BUN 105*   < > 157*  --  160*  --  137* 170* 143*  CREATININE 2.75*   < > 4.53*  --  4.26*  --  4.08* 3.70* 2.94*  CALCIUM 7.1*   < > 7.8*   < > 7.7*   < > 8.1* 8.0* 6.4*  MG 2.8*  --   --   --   --   --   --   --  2.2  PHOS 2.8  --   --   --   --   --   --   --  5.6*   < > = values in this interval not displayed.    Liver Function Tests: Recent Labs  Lab 12/18/19 0338 12/18/19 1146 12/19/19 0134  AST 63* 51* 63*  ALT 149* 132* 141*  ALKPHOS 76 75 92  BILITOT 0.5 0.6 0.3  PROT 5.7* 5.4* 6.2*  ALBUMIN 1.9* 1.7* 1.9*   No results for input(s): LIPASE, AMYLASE in the last 168 hours. No results for input(s): AMMONIA in the last 168 hours.  CBC: Recent Labs  Lab 12/15/19 0415 12/15/19 0415 12/16/19 0419 12/16/19 0419 12/17/19 0406 12/18/19 0338 12/19/19 0003 12/20/19 0420 12/21/19 0400  WBC 13.6*   < > 20.7*   < > 14.8* 15.6* 22.1* 24.1* 18.5*  NEUTROABS  10.7*  --  17.9*  --   --   --   --   --   --   HGB 7.8*   < > 9.1*   < > 8.3* 8.2* 9.4* 8.3* 7.1*  HCT 25.8*   < > 30.6*   < > 27.3* 26.0* 29.4* 25.9* 22.7*  MCV 103.2*   < > 104.8*   < > 101.9* 99.6 97.7 98.1 102.7*  PLT 142*   < > 185   < > 155 210 257 271 242   < > = values in this interval not displayed.    Cardiac Enzymes: Recent Labs  Lab 12/17/19 0406  CKTOTAL 621*    BNP: Invalid input(s): POCBNP  CBG: Recent Labs  Lab 12/20/19 0733 12/20/19 1125 12/20/19 1527 12/20/19 1945 12/20/19 2335  GLUCAP 146* 136* 146* 121* 121*    Microbiology: Results for orders placed or performed during the hospital encounter of 11/16/2019  SARS Coronavirus 2 by RT PCR (hospital order, performed in [facility] hospital lab) Nasopharyngeal Nasopharyngeal Swab     Status: Abnormal   Collection Time: 11/28/2019 10:51 AM   Specimen: Nasopharyngeal Swab  Result Value Ref Range Status   SARS Coronavirus 2 POSITIVE (A) NEGATIVE Final    Comment: RESULT CALLED TO, READ BACK BY AND VERIFIED WITH: WEST,S. RN _0  ON 9.20.2021 BY NMCCOY (NOTE) SARS-CoV-2 target nucleic acids are DETECTED  SARS-CoV-2 RNA is generally detectable in upper respiratory specimens  during the acute phase of infection.  Positive results are indicative  of the presence of the identified virus, but do not rule out bacterial infection or co-infection with other pathogens not detected by the test.  Clinical correlation with patient history and  other diagnostic information is necessary to determine patient infection status.  The expected result is negative.  Fact Sheet for Patients:   StrictlyIdeas.no   Fact Sheet for Healthcare Providers:   BankingDealers.co.za    This test is not yet approved or cleared by the [place] FDA and  has been authorized for detection and/or diagnosis of SARS-CoV-2 by FDA under an Emergency Use Authorization (EUA).  This EUA  will remain in effect (meaning th is test can be used) for the duration of  the COVID-19 declaration under Section 564(b)(1) of the Act, 21 U.S.C. section 360-bbb-3(b)(1), unless the authorization is terminated or revoked sooner.  Performed at [facility], [place] [address] [place], [place] [address]   Blood Culture (routine x 2)     Status: None   Collection Time: 11/29/2019 10:51 AM   Specimen: BLOOD RIGHT HAND  Result Value Ref Range  Status   Specimen Description   Final    BLOOD RIGHT HAND Performed at [facility], [place] [address], [place], [place] [address]    Special Requests   Final    BOTTLES DRAWN AEROBIC AND ANAEROBIC Blood Culture adequate volume Performed at [place] [address], [place], [place] [address]    Culture   Final    NO GROWTH 5 DAYS Performed at [facility], [place] [address] [place], [place] [address]    Report Status 11/29/2019 FINAL  Final  Blood Culture (routine x 2)     Status: None   Collection Time: 11/22/2019  1:10 PM   Specimen: BLOOD  Result Value Ref Range Status   Specimen Description   Final    BLOOD LEFT WRIST Performed at [place] [address], [place], [place] [address]    Special Requests   Final    BOTTLES DRAWN AEROBIC AND ANAEROBIC Blood Culture adequate volume Performed at [place] [address] [place], [place] [address]    Culture   Final    NO GROWTH 5 DAYS Performed at [facility], [place] [address], [place], [place] [address]    Report Status 11/29/2019 FINAL  Final  MRSA PCR Screening     Status: None   Collection Time: 12/07/19  7:35 AM   Specimen: Nasal Mucosa; Nasopharyngeal  Result Value Ref Range Status   MRSA by PCR NEGATIVE NEGATIVE Final    Comment:        The GeneXpert MRSA Assay (FDA approved for NASAL specimens only), is one component of a comprehensive MRSA colonization surveillance program. It  is not intended to diagnose MRSA infection nor to guide or monitor treatment for MRSA infections. Performed at [facility], [place] [address], [place], [place] [address]   MRSA PCR Screening     Status: None   Collection Time: 12/08/19 12:11 PM   Specimen: Nasal Mucosa; Nasopharyngeal  Result Value Ref Range Status   MRSA by PCR NEGATIVE NEGATIVE Final    Comment:        The GeneXpert MRSA Assay (FDA approved for NASAL specimens only), is one component of a comprehensive MRSA colonization surveillance program. It is not intended to diagnose MRSA infection nor to guide or monitor treatment for MRSA infections. Performed at [facility], [place] [address] [place], [place] [address]     Coagulation Studies: No results for input(s): LABPROT, INR in the last 72 hours.  Urinalysis: No results for input(s): COLORURINE, LABSPEC, PHURINE, GLUCOSEU, HGBUR, BILIRUBINUR, KETONESUR, PROTEINUR, UROBILINOGEN, NITRITE, LEUKOCYTESUR in the last 72 hours.  Invalid input(s): APPERANCEUR    Imaging: DG Chest Port 1 View  Result Date: 12/21/2019 CLINICAL DATA:  Acute respiratory failure EXAM: PORTABLE CHEST 1 VIEW COMPARISON:  12/20/2019 FINDINGS: Support Apparatus: --Endotracheal tube: Tip at the level of the clavicular heads. --Enteric tube:Tip and sideport project over the stomach. --Catheter(s):Left PICC line tip projects over the lower SVC. --Other: None Unchanged multifocal airspace opacities. IMPRESSION: Endotracheal tube tip at the level of the clavicular heads. Repositioned PICC line tip is likely in the lower SVC. Electronically Signed   By: Ulyses Jarred M.D.   On: 12/21/2019 05:27   DG CHEST PORT 1 VIEW  Result Date: 12/20/2019 CLINICAL DATA:  ARDS due to COVID-19 EXAM: PORTABLE CHEST 1 VIEW COMPARISON:  12/19/2019 FINDINGS: Multifocal pneumonia with relative sparing of the left lung apex, unchanged. No  pleural effusion or pneumothorax.  Endotracheal tube terminates 3.5 cm above the carina. Left arm PICC likely terminates in the azygos vein. Endotracheal tube courses into the stomach. IMPRESSION: Multifocal pneumonia in this patient with known COVID, unchanged. Endotracheal tube terminates 3.5 cm above the carina. Left arm PICC likely terminates in the azygos vein. Electronically Signed   By: Julian Hy M.D.   On: 12/20/2019 06:35     Medications:   . ampicillin-sulbactam (UNASYN) IV Stopped (12/21/19 0436)  . feeding supplement (NEPRO CARB STEADY) 1,000 mL (12/19/19 1327)  . fentaNYL infusion INTRAVENOUS 350 mcg/hr (12/21/19 0100)  . midazolam 4 mg/hr (12/21/19 0657)  . potassium chloride Stopped (12/21/19 0659)   . chlorhexidine gluconate (MEDLINE KIT)  15 mL Mouth Rinse BID  . Chlorhexidine Gluconate Cloth  6 each Topical Daily  . clopidogrel  75 mg Per Tube Daily  . darbepoetin (ARANESP) injection - NON-DIALYSIS  100 mcg Subcutaneous Q Sat-1800  . docusate  100 mg Per Tube BID  . feeding supplement (PROSource TF)  90 mL Per Tube BID  . free water  300 mL Per Tube Q3H  . heparin injection (subcutaneous)  5,000 Units Subcutaneous Q8H  . insulin aspart  0-9 Units Subcutaneous Q4H  . mouth rinse  15 mL Mouth Rinse 10 times per day  . pantoprazole sodium  40 mg Per Tube Q1200  . polyethylene glycol  17 g Per Tube Daily  . sodium chloride flush  10-40 mL Intracatheter Q12H   acetaminophen (TYLENOL) oral liquid 160 mg/5 mL, albuterol, fentaNYL, hydrALAZINE, midazolam, oxymetazoline, sodium chloride flush  Assessment/ Plan:  1.Acute kidney injury. Renal ultrasound did not reveal any evidence of hydronephrosis. The episode of acute kidney injury appeared to coincide with the episode of hypotension when patient request requiring intubation mechanical ventilation. Since that time patient has been in positive fluid balance. The does not appear to be any use nephrotoxins. There was the use of IV contrast but the timing of  the acute kidney injury does not coincide with the use of IV contrast. This makes this etiology unlikely. There is also the use of vancomycin although this was discontinued on 12/08/2019 there is possibility that there may be some vancomycin nephrotoxicity including acute tubular necrosis.  Urine greater than 50 RBCs 21-50 WBCs urine sodium 95.  CPK 621.  I do not think this represents acute interstitial nephritis or acute glomerulonephritis. These would be very difficult to diagnose there have been reported cases in the setting of Covid pneumonia. 2. Hypertension/volume  -patient appears to be well-hydrated and does not appear to be intravascular volume depleted. There is the presence of peripheral edema.  Renal function appears to be improving 3. Hyperkalemia.  Resolved.  Now hypokalemia is on   placement per critical care service labs called [facility] 4. Anemia we will check iron studies and evaluate for ESA.  Iron saturations 20%.  Will initiate ESA darbepoetin 100 mcg weekly 5. Covid 19 pneumonia as per primary service. 6. Acid-base status appears to be stable 7.  Hypocalcemia.  This corrects for low albumin     LOS: [place] _0 _1 :43 AM

## 2019-12-21 NOTE — Progress Notes (Signed)
eLink Physician-Brief Progress Note Patient Name: Aquilla Voiles DOB: 1950-03-24 MRN: [id number]   Date of Service  12/21/2019  HPI/Events of Note  Oliguria - Patient has had 3 I/O caths in last 24 hours. Currently bladder scan has 467 mL.  eICU Interventions  Plan: 1. Place Foley catheter.      Intervention Category Major Interventions: Other:  Lysle Dingwall 12/21/2019, 4:52 AM

## 2019-12-22 ENCOUNTER — Inpatient Hospital Stay (HOSPITAL_COMMUNITY): Payer: Medicare Other

## 2019-12-22 DIAGNOSIS — U071 COVID-19: Secondary | ICD-10-CM | POA: Diagnosis not present

## 2019-12-22 DIAGNOSIS — J9601 Acute respiratory failure with hypoxia: Secondary | ICD-10-CM | POA: Diagnosis not present

## 2019-12-22 LAB — BASIC METABOLIC PANEL
Anion gap: 9 (ref 5–15)
Anion gap: 11 (ref 5–15)
BUN: 174 mg/dL — ABNORMAL HIGH (ref 8–23)
BUN: 127 mg/dL — ABNORMAL HIGH (ref 8–23)
CO2: 29 mmol/L (ref 22–32)
CO2: 29 mmol/L (ref 22–32)
Calcium: 7.8 mg/dL — ABNORMAL LOW (ref 8.9–10.3)
Calcium: 7.8 mg/dL — ABNORMAL LOW (ref 8.9–10.3)
Chloride: 101 mmol/L (ref 98–111)
Chloride: 99 mmol/L (ref 98–111)
Creatinine, Ser: 3.47 mg/dL — ABNORMAL HIGH (ref 0.61–1.24)
Creatinine, Ser: 3.75 mg/dL — ABNORMAL HIGH (ref 0.61–1.24)
GFR, Estimated: 17 mL/min — ABNORMAL LOW (ref >60)
GFR, Estimated: 16 mL/min — ABNORMAL LOW (ref >60)
Glucose, Bld: 147 mg/dL — ABNORMAL HIGH (ref 70–99)
Glucose, Bld: 153 mg/dL — ABNORMAL HIGH (ref 70–99)
Potassium: 4 mmol/L (ref 3.5–5.1)
Potassium: 4.2 mmol/L (ref 3.5–5.1)
Sodium: 139 mmol/L (ref 135–145)
Sodium: 139 mmol/L (ref 135–145)

## 2019-12-22 LAB — BLOOD GAS, VENOUS
Acid-Base Excess: 3.5 mmol/L — ABNORMAL HIGH (ref 0.0–2.0)
Bicarbonate: 30.5 mmol/L — ABNORMAL HIGH (ref 20.0–28.0)
O2 Saturation: 74.9 %
Patient temperature: 98.6
pCO2, Ven: 68.3 mmHg — ABNORMAL HIGH (ref 44.0–60.0)
pH, Ven: 7.272 (ref 7.250–7.430)
pO2, Ven: 44.6 mmHg (ref 32.0–45.0)

## 2019-12-22 LAB — BLOOD GAS, ARTERIAL
Acid-Base Excess: 1.8 mmol/L (ref 0.0–2.0)
Bicarbonate: 29.3 mmol/L — ABNORMAL HIGH (ref 20.0–28.0)
FIO2: 65
MECHVT: 450 mL
O2 Saturation: 94.4 %
PEEP: 10 cmH2O
Patient temperature: 98.6
RATE: 35 resp/min
pCO2 arterial: 73 mmHg (ref 32.0–48.0)
pH, Arterial: 7.228 — ABNORMAL LOW (ref 7.350–7.450)
pO2, Arterial: 79.2 mmHg — ABNORMAL LOW (ref 83.0–108.0)

## 2019-12-22 LAB — CBC
HCT: 22.7 % — ABNORMAL LOW (ref 39.0–52.0)
Hemoglobin: 7.1 g/dL — ABNORMAL LOW (ref 13.0–17.0)
MCH: 32 pg (ref 26.0–34.0)
MCHC: 31.3 g/dL (ref 30.0–36.0)
MCV: 102.3 fL — ABNORMAL HIGH (ref 80.0–100.0)
Platelets: 281 10*3/uL (ref 150–400)
RBC: 2.22 MIL/uL — ABNORMAL LOW (ref 4.22–5.81)
RDW: 15 % (ref 11.5–15.5)
WBC: 21.8 10*3/uL — ABNORMAL HIGH (ref 4.0–10.5)
nRBC: 0.7 % — ABNORMAL HIGH (ref 0.0–0.2)

## 2019-12-22 LAB — GLUCOSE, CAPILLARY
Glucose-Capillary: 138 mg/dL — ABNORMAL HIGH (ref 70–99)
Glucose-Capillary: 137 mg/dL — ABNORMAL HIGH (ref 70–99)
Glucose-Capillary: 132 mg/dL — ABNORMAL HIGH (ref 70–99)
Glucose-Capillary: 117 mg/dL — ABNORMAL HIGH (ref 70–99)
Glucose-Capillary: 124 mg/dL — ABNORMAL HIGH (ref 70–99)
Glucose-Capillary: 144 mg/dL — ABNORMAL HIGH (ref 70–99)
Glucose-Capillary: 128 mg/dL — ABNORMAL HIGH (ref 70–99)
Glucose-Capillary: 132 mg/dL — ABNORMAL HIGH (ref 70–99)
Glucose-Capillary: 126 mg/dL — ABNORMAL HIGH (ref 70–99)

## 2019-12-22 LAB — MAGNESIUM: Magnesium: 2.7 mg/dL — ABNORMAL HIGH (ref 1.7–2.4)

## 2019-12-22 LAB — D-DIMER, QUANTITATIVE: D-Dimer, Quant: 3.05 ug/mL-FEU — ABNORMAL HIGH (ref 0.00–0.50)

## 2019-12-22 LAB — PHOSPHORUS: Phosphorus: 6.7 mg/dL — ABNORMAL HIGH (ref 2.5–4.6)

## 2019-12-22 MED ORDER — FUROSEMIDE 10 MG/ML IJ SOLN
120.0000 mg | Freq: Once | INTRAVENOUS | Status: AC
  Administered 2019-12-22: 120 mg via INTRAVENOUS
  Filled 2019-12-22: qty 10

## 2019-12-22 MED ORDER — SEVELAMER CARBONATE 800 MG PO TABS
800.0000 mg | ORAL_TABLET | Freq: Three times a day (TID) | ORAL | Status: DC
  Administered 2019-12-22: 800 mg via ORAL
  Filled 2019-12-22: qty 1

## 2019-12-22 MED ORDER — FUROSEMIDE 10 MG/ML IJ SOLN
120.0000 mg | Freq: Two times a day (BID) | INTRAVENOUS | Status: DC
  Administered 2019-12-22 – 2019-12-23 (×2): 120 mg via INTRAVENOUS
  Filled 2019-12-22: qty 12
  Filled 2019-12-22: qty 2
  Filled 2019-12-22: qty 12
  Filled 2019-12-22: qty 2

## 2019-12-22 MED ORDER — PROSOURCE TF PO LIQD: 45.0000 mL | Freq: Three times a day (TID) | ORAL | Status: DC

## 2019-12-22 MED ORDER — PROSOURCE TF PO LIQD
90.0000 mL | Freq: Two times a day (BID) | ORAL | Status: DC
  Administered 2019-12-22 – 2019-12-29 (×14): 90 mL
  Filled 2019-12-22 (×14): qty 90

## 2019-12-22 MED ORDER — SEVELAMER CARBONATE 800 MG PO TABS
800.0000 mg | ORAL_TABLET | Freq: Three times a day (TID) | ORAL | Status: DC
  Administered 2019-12-22 – 2019-12-24 (×8): 800 mg
  Filled 2019-12-22 (×9): qty 1

## 2019-12-22 MED ORDER — CHLORHEXIDINE GLUCONATE 0.12 % MT SOLN
OROMUCOSAL | Status: AC
  Administered 2019-12-22: 15 mL via OROMUCOSAL
  Filled 2019-12-22: qty 15

## 2019-12-22 MED ORDER — NEPRO/CARBSTEADY PO LIQD
1000.0000 mL | ORAL | Status: DC
  Administered 2019-12-24: 1000 mL
  Filled 2019-12-22 (×7): qty 1000

## 2019-12-22 NOTE — Progress Notes (Signed)
Pharmacy Antibiotic Note  Ronald Olsen is a 69 y.o. male admitted on 11/09/2019 with nosebleed. Marland Kitchen  Pharmacy has been consulted for Unasyn dosing to cover empirically for toxic shock while nose packing in place (packing performed on 10/13).    Amoxicillin intolerance noted with prolonged courses in past causing "cloudy head".  Patient currently in medically induced coma on venitlator with plans to leave packing in place for 7 days.    Today, 12/22/2019: - Tmax 98.4, wbc 21.8 - SCr  3.47 (crcl~25)  Plan: - continue Unasyn 3gm IV q12h - F/U renal function - F/U to d/c Unasyn once nose packing removed  _________________________________________  Height: 5\' 11"  (180.3 cm) Weight: 104.8 kg (231 lb 0.7 oz) IBW/kg (Calculated) : 75.3  Temp (24hrs), Avg:98.2 F (36.8 C), Min:97.8 F (36.6 C), Max:98.4 F (36.9 C)  Recent Labs  Lab 12/18/19 0338 12/18/19 0338 12/18/19 1146 12/19/19 0003 12/19/19 0134 12/20/19 0420 12/21/19 0400 12/22/19 0554  WBC 15.6*  --   --  22.1*  --  24.1* 18.5* 21.8*  CREATININE 4.53*   < > 4.26*  --  4.08* 3.70* 2.94* 3.47*   < > = values in this interval not displayed.    Estimated Creatinine Clearance: 25.4 mL/min (A) (by C-G formula based on SCr of 3.47 mg/dL (H)).    Allergies  Allergen Reactions  . Amoxicillin Other (See Comments)    "Cloudy headed" with long doses    Antimicrobials this admission:  9/20 Remdesivir  > 9/24 9/20 Baricitinib >10/3 10/3 vanc>>10/4 10/4 cefepime>> 10/9 10/3 CTX> 10/4 10/3 azith>> 10/7 10/13 Unasyn>>  Dose adjustments this admission:  10/6 Cefepime 2gm q8 > q12  Microbiology results:  9/20 BCx x2: NGF 9/20 COVID+ 10/3 MRSA PCR neg 10/4 MRSA PCR neg  Thank you for allowing pharmacy to be a part of this patient's care.  Eudelia Bunch, Pharm.D 12/22/2019 9:55 AM

## 2019-12-22 NOTE — Progress Notes (Signed)
12/22/2019 O2 sats dropped to 79, patient at rest. Suctioned by Hailey Romines, RN with minimal secretions obtained. With pre-oxygenation, sats improved to low 90s, then returned to 79-80. Notified Kennan Depue RT of above who requested increased FiO2 from 55 to 70% to improve oxygenation, then he will come assess patient shortly. Cindy S. Brigitte Pulse BSN, RN, [place] 12/22/2019 1:06 PM

## 2019-12-22 NOTE — Progress Notes (Signed)
12/22/2019 CRITICAL VALUE STICKER  CRITICAL VALUE: arterial blood gas pCO2 = 73.0  RECEIVER (on-site recipient of call): Tyrell Antonio RN  DATE & TIME NOTIFIED: 12/22/2019 7:01 PM  MESSENGER (representative from lab):   MD NOTIFIED: Larey Days MD  TIME OF NOTIFICATION: 19:04  RESPONSE: No change  Cindy S. Brigitte Pulse BSN, RN, [place] 12/22/2019 7:04 PM

## 2019-12-22 NOTE — Progress Notes (Signed)
[facility] Progress Note  Subjective: pt seen in room  Vitals:   12/22/19 1100 12/22/19 1105 12/22/19 1200 12/22/19 1305  BP:  (!) 128/49    Pulse: 98 94 94 98  Resp: (!) 37 (!) 36 (!) 35 (!) 30  Temp:   98.8 F (37.1 C)   TempSrc:   Axillary   SpO2: (!) 83% (!) 84% (!) 86% (!) 79%  Weight:      Height:        Exam: on vent ,sedated  no jvd  throat ett in place  Chest cta bilat and lat  Cor reg no RG  Abd soft ntnd no ascites   Ext diffuse 2+ UE and LE edema   Neuro on vent and sedated, no following commands    RUS 10/12 >  12 cm kidneys w/o hydro   UA 10/13 - 30 prot, rbc >50/ 20-50 wbc/ 0-5 epi/ many bact    10/12 -  UNa 46 / UCr 111  Summary: 69 yo w/ COVID-19 PNA admitted 9/20,  rx'd w/ IV steroids/ remdesivir and baricitinib. Intubated on 10/5.  Had some hypotension after intubation into 70's, was on pressors, and developed AKI.  RUS was wnl. Did get IV contrast on 9/29, neg for PE. Got IV vanc on 10/3 and 10/4. Creat was normal at 0.98 on 10/02. On 10/06 creat started to rise to 2.1 >> and peaked at 4.60 pm 10/13 then improved > 4.0 > 3.70 > 2.94 yest but back up to 3.47 today. Pt is nonoliguric.     Assessment/ Plan: 1. AKI - nonoliguric in pt w/ COVID-19 PNA. ATN most likely due to hypotension, possibly IV vanc x 2 days. Was improving, but creat up today.  CCM giving IV lasix given vol overload. Will re-order the lasix at q 12 hrs for now. F/u labs and UOP in am.  2. ARDS d/t COVID PNA - per CCM 3. Shock - sp IV cefepime, levo gtt weaned off 10/7 4. CAD hx stent     Rob Jeret Goyer 12/22/2019, 1:44 PM   Recent Labs  Lab 12/21/19 0400 12/22/19 0554  K 3.0* 4.0  BUN 143* 174*  CREATININE 2.94* 3.47*  CALCIUM 6.4* 7.8*  PHOS 5.6* 6.7*  HGB 7.1* 7.1*   Inpatient medications: . chlorhexidine gluconate (MEDLINE KIT)  15 mL Mouth Rinse BID  . Chlorhexidine Gluconate Cloth  6 each Topical Daily  . clopidogrel  75 mg Per Tube Daily  .  darbepoetin (ARANESP) injection - NON-DIALYSIS  100 mcg Subcutaneous Q Sat-1800  . docusate  100 mg Per Tube BID  . feeding supplement (PROSource TF)  90 mL Per Tube BID  . free water  300 mL Per Tube Q3H  . heparin injection (subcutaneous)  5,000 Units Subcutaneous Q8H  . insulin aspart  0-9 Units Subcutaneous Q4H  . mouth rinse  15 mL Mouth Rinse 10 times per day  . pantoprazole sodium  40 mg Per Tube Q1200  . polyethylene glycol  17 g Per Tube Daily  . sevelamer carbonate  800 mg Per Tube TID WC  . sodium chloride flush  10-40 mL Intracatheter Q12H   . ampicillin-sulbactam (UNASYN) IV Stopped (12/22/19 0408)  . feeding supplement (NEPRO CARB STEADY) 60 mL/hr at 12/22/19 1200  . fentaNYL infusion INTRAVENOUS 400 mcg/hr (12/22/19 1046)  . furosemide     acetaminophen (TYLENOL) oral liquid 160 mg/5 mL, albuterol, fentaNYL, hydrALAZINE, oxymetazoline, sodium chloride flush

## 2019-12-22 NOTE — Progress Notes (Signed)
Notified by attending MD and IP provider OK to take off of airborne and contact precautions. Wife updated of this.

## 2019-12-22 NOTE — TOC Progression Note (Signed)
Transition of Care [facility]) - Progression Note    Patient Details  Name: Rashied Corallo MRN: [id number] Date of Birth: [birth year]  [facility] CM/SW Contact  Leeroy Cha, RN Phone Number: 12/22/2019, 7:40 AM  Clinical Narrative:    [place] [facility] Events   9/20  Admit, COVID positive, PCT <0.10, BNP neg. High dose steroids, Remdesivir and baricitinib.  9/21 HFNC 15 liters    9/22 CRP down 3.4 but still on high FIO2 9/25 Completed day 5 of Remdesivir. Still on high dose steroids and Baricitinib  9/26 Increased oxygen demand. Placed on NRB and high flow.  9/28 Rapid desaturation w/ any movement  9/29 CT angio negative for PE  10/4 Worsening resp failure, rapid response called. PCCM asked to see w/ tranfers to ICU 10/5 Intubated. Replaced w larger 8.0 ETT 10/5 for intermittent cuffleak. Epistaxis  10/6  Prone positioning -FiO2 0.60, PEEP 10 ,Continued oozing from nare, even after packing.  Heparin held 10/8 FiO2 increased to 60% due to desaturation 10/9 Nasal packing removed 10/11 Ongoing oozing from right nares, draining into mouth, vent dyssynchrony.  Heparin SQ held. P/F 87, prone 10/13 Nose repacked after review with ENT 10/14 Weaned to fentanyl gtt only, followed commands  Following for progression, plan is unknown at this time due to instability.  desats with movement to 85%, bun 174/creat 3.47, d.dimer is 3.05, wbc 21.8, hgb 7.1 Following for possible ltach placement.   Expected Discharge Plan: [place] Barriers to Discharge: Barriers Unresolved (comment)  Expected Discharge Plan and Services Expected Discharge Plan: [place]   Discharge Planning Services: CM Consult   Living arrangements for the past 2 months: Single Family Home                                       Social Determinants of Health (SDOH) Interventions    Readmission Risk Interventions No flowsheet data found.

## 2019-12-22 NOTE — Progress Notes (Signed)
[facility] Progress Note Patient Name: Ronald Olsen DOB: 1950/08/31 MRN: [id number]   Date of Service  12/22/2019  HPI/Events of Note  Lasix 120 q12 hr, MAP 69, whether to give or not.  Nephrology notes: 1. AKI - nonoliguric in pt w/ COVID-19 PNA. ATN most likely due to hypotension, possibly IV vanc x 2 days. Was improving, but creat up today.  CCM giving IV lasix given vol overload. Will re-order the lasix at q 12 hrs for now. F/u labs and UOP in am.  2. ARDS d/t COVID PNA - per CCM 3. Shock - sp IV cefepime, levo gtt weaned off 10/7 4. CAD hx stent  eICU Interventions  - ok to give scheduled lasix, if MAP < 65 to call me back.      Intervention Category Intermediate Interventions: Oliguria - evaluation and management  Elmer Sow 12/22/2019, 11:00 PM

## 2019-12-22 NOTE — Progress Notes (Addendum)
Nutrition Follow-up  DOCUMENTATION CODES:   Not applicable  INTERVENTION:  - will change TF regimen to Nepro @ 60 ml/hr with 88m Prosource TF BID. - this regimen will provide 2752 kcal, 161 grams protein, and 1047 ml free water. - free water flush to continue to be per CCM.    NUTRITION DIAGNOSIS:   Increased nutrient needs related to acute illness (COVID-19 infection) as evidenced by estimated needs. -ongoing  GOAL:   Patient will meet greater than or equal to 90% of their needs -met with TF regimen  MONITOR:   Vent status, TF tolerance, Labs, Weight trends  ASSESSMENT:   69year old male with a past medical history for hypertension, dyslipidemia who presents with dyspnea, malaise, fatigue and cough for about 10 days.  Acute worsening of his symptoms over the last 72 hours prior to hospitalization.  Multiple sick contacts at home, he has not been vaccinated.Patient admitted to the hospital with working diagnosis of acute hypoxic respiratory failure due to SARS COVID-19 viral pneumonia.  Patient remains intubated with OGT in place. He is receiving Nepro @ 55 ml/hr with 90 ml Prosource TF BID and 300 ml free water every 3 hours (free water flush started 10/9). This regimen is providing 2536 kcal (96% estimated kcal need), 151 grams protein, and 3360 ml free water.   Weight has been trending up since 10/8. Generalized moderated pitting edema, moderate pitting edema to BUE, and deep pitting edema to BLE documented.   Nephrology following.   Per notes: - nasal packing had been removed 10/9 but was replaced on 10/13 d/t ongoing oozing from nares - plan for nasal packing to be removed on 10/19 - ARDS 2/2 COVID PNA - acute metabolic encephalopathy - leukocytosis--improving - AKI    Patient is currently intubated on ventilator support MV: 14 L/min Temp (24hrs), Avg:98.2 F (36.8 C), Min:97.8 F (36.6 C), Max:98.4 F (36.9 C) Propofol: none  Labs reviewed; CBGs: 138, 137,  132 mg/dl, BUN: 174 mg/dl, creatinine: 3.47 mg/dl, Ca: 7.8 mg/dl, Phos: 6.7 mg/dl, Mg: 2.7 mg/dl, GFR: 27 ml/min. Medications reviewed; 100 mg colace BID, 120 mg IV lasix x1 dose 10/18, sliding scale novolog, 17 g miralax/day, 10 mEq IV KCL x4 runs 10/17, 800 mg renvela TID starting 10/18. Drip; fentanyl @ 400 mcg/hr.   Diet Order:   Diet Order            Diet NPO time specified  Diet effective now                 EDUCATION NEEDS:   No education needs have been identified at this time  Skin:  Skin Assessment: Skin Integrity Issues: Skin Integrity Issues:: DTI, Other (Comment) DTI: sacrum (10/8); R ear (newly documented 10/18) Stage I: sacrum (newly documented on 10/8) Other: sacral wound (10/12); R upper back skin tear (10/18)  Last BM:  10/18 (type 4)  Height:   Ht Readings from Last 1 Encounters:  12/09/19 5' 11" (1.803 m)    Weight:   Wt Readings from Last 1 Encounters:  12/22/19 104.8 kg     Estimated Nutritional Needs:  Kcal:  2640 kcal (30 kcal/kg) Protein:  150-176 grams (1.7-2 grams/kg) Fluid:  >/= 3 L/day      JJarome Matin MS, RD, LDN, CNSC Inpatient Clinical Dietitian RD pager # available in [facility]  After hours/weekend pager # available in [facility]

## 2019-12-22 NOTE — Progress Notes (Signed)
NAME:  Ronald Olsen, MRN:  [id number], DOB:  04/09/1950, LOS: 56 ADMISSION DATE:  11/08/2019, CONSULTATION DATE:  10/4 REFERRING MD:  Doreatha Martin, CHIEF COMPLAINT:  Respiratory failure in setting of COVID-19   Brief History   69 year old male patient admitted on 9/20 with Covid pneumonia. Treated with supplemental oxygen, systemic steroids, remdesivir and baricitinib. Progressive resp failure, intubated 10/5. Evolving ARF. Treated empirically for possible HCAP.   Past Medical History  Hypertension, dyslipidemia. Coronary artery disease  Significant Hospital Events   9/20  Admit, COVID positive, PCT <0.10, BNP neg. High dose steroids, Remdesivir and baricitinib.  9/21 HFNC 15 liters    9/22 CRP down 3.4 but still on high FIO2 9/25 Completed day 5 of Remdesivir. Still on high dose steroids and Baricitinib  9/26 Increased oxygen demand. Placed on NRB and high flow.  9/28 Rapid desaturation w/ any movement  9/29 CT angio negative for PE  10/4 Worsening resp failure, rapid response called. PCCM asked to see w/ tranfers to ICU 10/5 Intubated. Replaced w larger 8.0 ETT 10/5 for intermittent cuffleak. Epistaxis  10/6  Prone positioning -FiO2 0.60, PEEP 10 ,Continued oozing from nare, even after packing.  Heparin held 10/8 FiO2 increased to 60% due to desaturation 10/9 Nasal packing removed 10/11 Ongoing oozing from right nares, draining into mouth, vent dyssynchrony.  Heparin SQ held. P/F 87, prone 10/13 Nose repacked after review with ENT 10/14 Weaned to fentanyl gtt only, followed commands  10/17 weaned again to fentanyl (versed resumed over weekend)  Consults:  Pulmonary consulted 10/4 ENT 10/9, 10/13  Procedures:  ETT 10/5 >>  LUE PICC 10/5 >>  R Femoral ALine 10/6 >> 10/12 R Radial Aline 10/15 >>  Significant Diagnostic Tests:  9/29 CT Chest 9/29 >> multifocal groundglass attenuations bilaterally through all lobes 10/5 ECHO >> Intact LV function 89-37%, grade 1 diastolic  dysfunction, normal RV function and size 10/7 LE Venous duplex >> negative 10/12 Renal US >> unremarkable kidneys, decompressed bladder but diffuse wall thickening  Micro Data:  COVID 9/29 >> Positive BCx2 9/20 >> Negative  Antimicrobials:  Remdesivir 9/20 >> 9/25 Vanc 10/3 >> 10/6 Ceftriaxone 10/3 >> 10/4 Cefepime 10/4 >> 10/9 Azitho 10/3 >> 10/6  Interim history/subjective:   No acute issues overnight. Sedation is weaning down. FiO2 to 55%, net positive, grossly volume overloaded on exam  Objective   Blood pressure (!) 145/62, pulse 97, temperature 98.2 F (36.8 C), temperature source Axillary, resp. rate (!) 30, height 5\' 11"  (1.803 m), weight 104.8 kg, SpO2 (!) 87 %. CVP:  [12 mmHg-16 mmHg] 13 mmHg  Vent Mode: PRVC FiO2 (%):  [45 %-60 %] 55 % Set Rate:  [35 bmp] 35 bmp Vt Set:  [450 mL] 450 mL PEEP:  [10 cmH20] 10 cmH20 Plateau Pressure:  [28 cmH20-32 cmH20] 32 cmH20   Intake/Output Summary (Last 24 hours) at 12/22/2019 1025 Last data filed at 12/22/2019 0800 Gross per 24 hour  Intake 4483.92 ml  Output 1560 ml  Net 2923.92 ml   Filed Weights   12/20/19 0444 12/21/19 0408 12/22/19 0424  Weight: 102.8 kg 104.5 kg 104.8 kg    Examination: Gen:      No acute distress, sedated HEENT:  EOMI, sclera anicteric Neck:     No masses; no thyromegaly, ETT Lungs:    Coarse ventilated sounds; normal respiratory effort CV:         Regular rate and rhythm; no murmurs Abd:      + bowel sounds; soft, non-tender;  no palpable masses, no distension Ext:    Pitting edema bilaterally; adequate peripheral perfusion Skin:      Warm and dry; no rash Neuro:  Sedated, does not follow commands  Labs/imaging personally reviewed   Resolved Hospital Problem list   Mixed acidosis - Bicarb weaned to off 10/6 Severe leukocytosis, suspect steroid and stress  Assessment & Plan:   ARDS secondary to COVID PNA Completed remdesivir, baricitinib, steroids.  ETT with cuff leak 10/5,  changed.  Continue low tidal volume ventilation Follow intermittent chest x-ray Wean FiO2 and PEEP as tolerated Attempt diuresis 10/18 given exam, mild worsening in   Acute Metabolic Encephalopathy  Sedation Needs while on Vent Weaning sedation.  Shock In the setting of sedating medications + presumed sepsis.  Completed course of cefepime, echocardiogram 10/5 reassuring. Norepi weaned to off 10/7 -monitor hemodynamics in ICU   Leukocytosis Afebrile, may be nasal packing  Follow CBC  CAD  Hx stent on plavix -continue plavix  -hold lipitor with elevated CK   AKI, Non-Oliguric  Hyperkalemia > now hypokalemic Negative renal US, CK 621.  Likely ATN from hypotension.   Replete electrolytes Attempt to Diurese 10/18    Epistaxis Suspect traumatic from NG tube attempt.  INR 1.4.  LE duplex negative, ECHO negative.   Enoxaparin, aspirin, Plavix held. heparin gtt stopped on 10/6.  Re-packed 10/13, on DVT ppx.  -nasal packing for 7 days, remove 10/19 -empiric unasyn for toxic shock coverage -follow Hgb trend  -PRN afrin  Macrocytic Anemia  B12 964 Follow CBC  Hyperglycemia Mild, suspect steroid-induced -SSI   Best practice:  Diet: NPO, TF  Pain/Anxiety/Delirium protocol (if indicated): PAD protocol  VAP protocol (if indicated): Ordered DVT prophylaxis: SCD's, heparin restarted 10/12, see above  GI prophylaxis: PPI Glucose control: SSI    Mobility: Bedrest Code Status: DNR Family Communication: Wife [name] [phone]) updated 10/17 via phone on plan of care.  Disposition: ICU   Critical care time:   CRITICAL CARE Performed by: Bonna Gains Joyce Leckey   Total critical care time: 35 minutes  Critical care time was exclusive of separately billable procedures and treating other patients.  Critical care was necessary to treat or prevent imminent or life-threatening deterioration.  Critical care was time spent personally by me on the following activities: development  of treatment plan with patient and/or surrogate as well as nursing, discussions with consultants, evaluation of patient's response to treatment, examination of patient, obtaining history from patient or surrogate, ordering and performing treatments and interventions, ordering and review of laboratory studies, ordering and review of radiographic studies, pulse oximetry and re-evaluation of patient's condition.

## 2019-12-23 DIAGNOSIS — J9601 Acute respiratory failure with hypoxia: Secondary | ICD-10-CM | POA: Diagnosis not present

## 2019-12-23 DIAGNOSIS — U071 COVID-19: Secondary | ICD-10-CM | POA: Diagnosis not present

## 2019-12-23 LAB — BLOOD GAS, ARTERIAL
Acid-Base Excess: 3.6 mmol/L — ABNORMAL HIGH (ref 0.0–2.0)
Bicarbonate: 30.5 mmol/L — ABNORMAL HIGH (ref 20.0–28.0)
FIO2: 80
MECHVT: 450 mL
O2 Saturation: 98.6 %
Patient temperature: 98.6
RATE: 35 resp/min
pCO2 arterial: 74.2 mmHg (ref 32.0–48.0)
pH, Arterial: 7.237 — ABNORMAL LOW (ref 7.350–7.450)
pO2, Arterial: 126 mmHg — ABNORMAL HIGH (ref 83.0–108.0)

## 2019-12-23 LAB — RENAL FUNCTION PANEL
Albumin: 1.4 g/dL — ABNORMAL LOW (ref 3.5–5.0)
Anion gap: 11 (ref 5–15)
BUN: 144 mg/dL — ABNORMAL HIGH (ref 8–23)
CO2: 23 mmol/L (ref 22–32)
Calcium: 6.4 mg/dL — CL (ref 8.9–10.3)
Chloride: 107 mmol/L (ref 98–111)
Creatinine, Ser: 3.03 mg/dL — ABNORMAL HIGH (ref 0.61–1.24)
GFR, Estimated: 20 mL/min — ABNORMAL LOW (ref >60)
Glucose, Bld: 109 mg/dL — ABNORMAL HIGH (ref 70–99)
Phosphorus: 6.3 mg/dL — ABNORMAL HIGH (ref 2.5–4.6)
Potassium: 3.3 mmol/L — ABNORMAL LOW (ref 3.5–5.1)
Sodium: 141 mmol/L (ref 135–145)

## 2019-12-23 LAB — GLUCOSE, CAPILLARY
Glucose-Capillary: 125 mg/dL — ABNORMAL HIGH (ref 70–99)
Glucose-Capillary: 121 mg/dL — ABNORMAL HIGH (ref 70–99)
Glucose-Capillary: 130 mg/dL — ABNORMAL HIGH (ref 70–99)
Glucose-Capillary: 111 mg/dL — ABNORMAL HIGH (ref 70–99)
Glucose-Capillary: 107 mg/dL — ABNORMAL HIGH (ref 70–99)
Glucose-Capillary: 117 mg/dL — ABNORMAL HIGH (ref 70–99)

## 2019-12-23 LAB — D-DIMER, QUANTITATIVE: D-Dimer, Quant: 3.24 ug/mL-FEU — ABNORMAL HIGH (ref 0.00–0.50)

## 2019-12-23 MED ORDER — PHENYLEPHRINE HCL-NACL 10-0.9 MG/250ML-% IV SOLN
0.0000 ug/min | INTRAVENOUS | Status: DC
  Administered 2019-12-24: 30 ug/min via INTRAVENOUS
  Administered 2019-12-24: 20 ug/min via INTRAVENOUS
  Filled 2019-12-23 (×2): qty 250

## 2019-12-23 MED ORDER — POTASSIUM CHLORIDE 10 MEQ/100ML IV SOLN
10.0000 meq | INTRAVENOUS | Status: AC
  Administered 2019-12-23 (×2): 10 meq via INTRAVENOUS
  Filled 2019-12-23 (×2): qty 100

## 2019-12-23 MED ORDER — FREE WATER
200.0000 mL | Status: DC
  Administered 2019-12-23 – 2019-12-26 (×11): 200 mL

## 2019-12-23 NOTE — Progress Notes (Signed)
NAME:  Ronald Olsen, MRN:  [id number], DOB:  June 25, 1950, LOS: 4 ADMISSION DATE:  12/02/2019, CONSULTATION DATE:  10/4 REFERRING MD:  Doreatha Martin, CHIEF COMPLAINT:  Respiratory failure in setting of COVID-19   Brief History   69 year old male patient admitted on 9/20 with Covid pneumonia. Treated with supplemental oxygen, systemic steroids, remdesivir and baricitinib. Progressive resp failure, intubated 10/5. Evolving ARF. Treated empirically for possible HCAP.   Past Medical History  Hypertension, dyslipidemia. Coronary artery disease  Significant Hospital Events   9/20  Admit, COVID positive, PCT <0.10, BNP neg. High dose steroids, Remdesivir and baricitinib.  9/21 HFNC 15 liters    9/22 CRP down 3.4 but still on high FIO2 9/25 Completed day 5 of Remdesivir. Still on high dose steroids and Baricitinib  9/26 Increased oxygen demand. Placed on NRB and high flow.  9/28 Rapid desaturation w/ any movement  9/29 CT angio negative for PE  10/4 Worsening resp failure, rapid response called. PCCM asked to see w/ tranfers to ICU 10/5 Intubated. Replaced w larger 8.0 ETT 10/5 for intermittent cuffleak. Epistaxis  10/6  Prone positioning -FiO2 0.60, PEEP 10 ,Continued oozing from nare, even after packing.  Heparin held 10/8 FiO2 increased to 60% due to desaturation 10/9 Nasal packing removed 10/11 Ongoing oozing from right nares, draining into mouth, vent dyssynchrony.  Heparin SQ held. P/F 87, prone 10/13 Nose repacked after review with ENT 10/14 Weaned to fentanyl gtt only, followed commands  10/17 weaned again to fentanyl (versed resumed over weekend) 10/18 attempted diuresis, still net positive, Cr slightly improved 10/19 went down on fent gtt overnight, dyssynchronous FiO2 increased, weaning FiO2 back down with pO2 126  Consults:  Pulmonary consulted 10/4 ENT 10/9, 10/13  Procedures:  ETT 10/5 >>  LUE PICC 10/5 >>  R Femoral ALine 10/6 >> 10/12 R Radial Aline 10/15  >>  Significant Diagnostic Tests:  9/29 CT Chest 9/29 >> multifocal groundglass attenuations bilaterally through all lobes 10/5 ECHO >> Intact LV function 24-40%, grade 1 diastolic dysfunction, normal RV function and size 10/7 LE Venous duplex >> negative 10/12 Renal US >> unremarkable kidneys, decompressed bladder but diffuse wall thickening  Micro Data:  COVID 9/29 >> Positive BCx2 9/20 >> Negative  Antimicrobials:  Remdesivir 9/20 >> 9/25 Vanc 10/3 >> 10/6 Ceftriaxone 10/3 >> 10/4 Cefepime 10/4 >> 10/9 Azitho 10/3 >> 10/6  Interim history/subjective:   FiO2 up due to reported vent dyssynchrony in setting of attempt to wean fentanyl gtt. Riding vent this AM, FiO2 weanable.  Objective   Blood pressure (!) 111/52, pulse 94, temperature 98.8 F (37.1 C), temperature source Axillary, resp. rate (!) 35, height 5\' 11"  (1.803 m), weight 104.8 kg, SpO2 97 %. CVP:  [5 mmHg-8 mmHg] 7 mmHg  Vent Mode: PRVC FiO2 (%):  [55 %-80 %] 60 % Set Rate:  [35 bmp] 35 bmp Vt Set:  [450 mL] 450 mL PEEP:  [10 cmH20] 10 cmH20 Plateau Pressure:  [23 cmH20-33 cmH20] 32 cmH20   Intake/Output Summary (Last 24 hours) at 12/23/2019 0924 Last data filed at 12/23/2019 0636 Gross per 24 hour  Intake 4094.68 ml  Output 1850 ml  Net 2244.68 ml   Filed Weights   12/20/19 0444 12/21/19 0408 12/22/19 0424  Weight: 102.8 kg 104.5 kg 104.8 kg    Examination: Gen:      No acute distress, sedated HEENT:  EOMI, sclera anicteric Neck:     No masses; no thyromegaly, ETT Lungs:    Coarse ventilated sounds; normal  respiratory effort CV:         Regular rate and rhythm; no murmurs Abd:      + bowel sounds; soft, non-tender; no palpable masses, no distension Ext:    Pitting edema bilaterally in UEs; adequate peripheral perfusion Skin:      Warm and dry; no rash Neuro:  Sedated, does not follow commands  Labs/imaging personally reviewed CXR 10/18 lower volumes, unchanged to slightly worse  infiltrates  Resolved Hospital Problem list   Mixed acidosis - Bicarb weaned to off 10/6 Severe leukocytosis, suspect steroid and stress  Assessment & Plan:   ARDS secondary to COVID PNA Completed remdesivir, baricitinib, steroids.  ETT with cuff leak 10/5, changed.  Off precautions 10/18 Continue low tidal volume ventilation Follow intermittent chest x-ray Wean FiO2 and PEEP as tolerated Attempt diuresis given exam, mild worsening in oxygenation  Acute Metabolic Encephalopathy  Sedation Needs while on Vent Weaning sedation.  Shock In the setting of sedating medications + presumed sepsis.  Completed course of cefepime, echocardiogram 10/5 reassuring. Norepi weaned to off 10/7 -monitor hemodynamics in ICU   Leukocytosis Afebrile, may be nasal packing  Follow CBC  CAD  Hx stent on plavix -continue plavix  -hold lipitor with elevated CK   AKI, Non-Oliguric  Hyperkalemia > now hypokalemic Negative renal US, CK 621.  Likely ATN from hypotension.  Cr fluctuating, starting to come down. BUN remains quite elevated. Replete electrolytes Attempt to Diurese, trial metolazone this PM if output not picking up   Epistaxis Suspect traumatic from NG tube attempt.  INR 1.4.  LE duplex negative, ECHO negative.   Enoxaparin, aspirin, Plavix held. heparin gtt stopped on 10/6.  Re-packed 10/13, on DVT ppx.  -nasal packing for 7 days, remove 10/19 -empiric unasyn for toxic shock coverage -follow Hgb trend  -PRN afrin  Macrocytic Anemia  B12 964 Follow CBC  Hyperglycemia Mild, suspect steroid-induced -SSI   Best practice:  Diet: NPO, TF  Pain/Anxiety/Delirium protocol (if indicated): PAD protocol  VAP protocol (if indicated): Ordered DVT prophylaxis: SCD's, heparin restarted 10/12, see above  GI prophylaxis: PPI Glucose control: SSI    Mobility: Bedrest Code Status: DNR Family Communication: Wife [name] [phone]) updated 10/18 at bedside  Disposition: ICU   Critical  care time:   CRITICAL CARE Performed by: Lanier Clam   Total critical care time: 32 minutes  Critical care time was exclusive of separately billable procedures and treating other patients.  Critical care was necessary to treat or prevent imminent or life-threatening deterioration.  Critical care was time spent personally by me on the following activities: development of treatment plan with patient and/or surrogate as well as nursing, discussions with consultants, evaluation of patient's response to treatment, examination of patient, obtaining history from patient or surrogate, ordering and performing treatments and interventions, ordering and review of laboratory studies, ordering and review of radiographic studies, pulse oximetry and re-evaluation of patient's condition.

## 2019-12-23 NOTE — Progress Notes (Signed)
[facility] Progress Note Patient Name: Ronald Olsen DOB: 31-Jan-1951 MRN: [id number]   Date of Service  12/23/2019  HPI/Events of Note  Low K 3.3, Cr > 3. Alb /cal low. Corrected ok.   eICU Interventions  Kcl 10 meq q1 hr x 2 doses ordered.     Intervention Category Intermediate Interventions: Electrolyte abnormality - evaluation and management  Elmer Sow 12/23/2019, 4:52 AM

## 2019-12-23 NOTE — Progress Notes (Signed)
[facility] Progress Note  Subjective: pt seen in room, creat down , 1.9 L out yest on IV lasix. TF"s on hold d/t aspiration event.   Vitals:   12/23/19 1300 12/23/19 1400 12/23/19 1500 12/23/19 1528  BP:      Pulse: (!) 102 97 91   Resp: (!) 28 (!) 35 (!) 38   Temp:      TempSrc:      SpO2: (!) 74% 99% 92% 94%  Weight:      Height:        Exam: on vent ,sedated  no jvd  throat ett in place  Chest cta bilat and lat  Cor reg no RG  Abd soft ntnd no ascites   Ext diffuse 2+ UE and LE edema   Neuro on vent and sedated, no following commands    RUS 10/12 >  12 cm kidneys w/o hydro   UA 10/13 - 30 prot, rbc >50/ 20-50 wbc/ 0-5 epi/ many bact    10/12 -  UNa 46 / UCr 111  Summary: 69 yo w/ COVID-19 PNA admitted 9/20,  rx'd w/ IV steroids/ remdesivir and baricitinib. Intubated on 10/5.  Had some hypotension after intubation into 70's, was on pressors, and developed AKI.  RUS was wnl. Did get IV contrast on 9/29, neg for PE. Got IV vanc on 10/3 and 10/4. Creat was normal at 0.98 on 10/02. On 10/06 creat started to rise to 2.1 >> and peaked at 4.60 pm 10/13 then improved > 4.0 > 3.70 > 2.94 yest but back up to 3.47 today. Pt is nonoliguric.     Assessment/ Plan: 1. AKI - nonoliguric in pt w/ COVID-19 PNA. ATN most likely due to hypotension, possibly IV vanc x 2 days. Was improving then creat bumped up yest. IV lasix started high dose yest and creat down today, may be responding. Would cont lasix for now. F/U UOP, wt's, labs, etc. Have d/w attending.  2. ARDS d/t COVID PNA - per CCM 3. Shock - sp IV cefepime, levo gtt weaned off 10/7 4. CAD hx stent     Rob Ninette Cotta 12/23/2019, 3:35 PM   Recent Labs  Lab 12/21/19 0400 12/21/19 0400 12/22/19 0554 12/22/19 0554 12/22/19 1728 12/23/19 0300  K 3.0*   < > 4.0   < > 4.2 3.3*  BUN 143*   < > 174*   < > 127* 144*  CREATININE 2.94*   < > 3.47*   < > 3.75* 3.03*  CALCIUM 6.4*   < > 7.8*   < > 7.8* 6.4*  PHOS 5.6*    < > 6.7*  --   --  6.3*  HGB 7.1*  --  7.1*  --   --   --    < > = values in this interval not displayed.   Inpatient medications: . chlorhexidine gluconate (MEDLINE KIT)  15 mL Mouth Rinse BID  . Chlorhexidine Gluconate Cloth  6 each Topical Daily  . clopidogrel  75 mg Per Tube Daily  . darbepoetin (ARANESP) injection - NON-DIALYSIS  100 mcg Subcutaneous Q Sat-1800  . docusate  100 mg Per Tube BID  . feeding supplement (PROSource TF)  90 mL Per Tube BID  . free water  200 mL Per Tube Q4H  . heparin injection (subcutaneous)  5,000 Units Subcutaneous Q8H  . insulin aspart  0-9 Units Subcutaneous Q4H  . mouth rinse  15 mL Mouth Rinse 10 times per day  . pantoprazole sodium  40 mg Per Tube Q1200  . polyethylene glycol  17 g Per Tube Daily  . sevelamer carbonate  800 mg Per Tube TID WC  . sodium chloride flush  10-40 mL Intracatheter Q12H   . ampicillin-sulbactam (UNASYN) IV 3 g (12/23/19 1511)  . feeding supplement (NEPRO CARB STEADY) 60 mL/hr at 12/23/19 0400  . fentaNYL infusion INTRAVENOUS 400 mcg/hr (12/23/19 1400)  . furosemide Stopped (12/23/19 1205)   acetaminophen (TYLENOL) oral liquid 160 mg/5 mL, albuterol, fentaNYL, hydrALAZINE, oxymetazoline, sodium chloride flush

## 2019-12-23 NOTE — Progress Notes (Signed)
Pt asynchronous with ventilator and beginning to desaturate.  FiO2 on ventilator increased to 80%.

## 2019-12-23 NOTE — Progress Notes (Signed)
eLink Physician-Brief Progress Note Patient Name: Ronald Olsen DOB: [birth year] MRN: [id number]   Date of Service  12/23/2019  HPI/Events of Note  Hypotension - BP = 84/40. CVP = 10-12. Evening dose of Lasix held.   eICU Interventions  Plan: 1. Phenylephrine IV infusion. Titrate to MAP >= 65.  2. Monitor CVP Q 4 hours.      Intervention Category Major Interventions: Hypotension - evaluation and management  Zanyla Klebba Eugene 12/23/2019, 11:44 PM

## 2019-12-24 ENCOUNTER — Inpatient Hospital Stay (HOSPITAL_COMMUNITY): Payer: Medicare Other

## 2019-12-24 DIAGNOSIS — R7989 Other specified abnormal findings of blood chemistry: Secondary | ICD-10-CM | POA: Diagnosis not present

## 2019-12-24 DIAGNOSIS — J9601 Acute respiratory failure with hypoxia: Secondary | ICD-10-CM | POA: Diagnosis not present

## 2019-12-24 DIAGNOSIS — U071 COVID-19: Secondary | ICD-10-CM | POA: Diagnosis not present

## 2019-12-24 LAB — RENAL FUNCTION PANEL
Albumin: 1.8 g/dL — ABNORMAL LOW (ref 3.5–5.0)
Anion gap: 14 (ref 5–15)
BUN: 207 mg/dL — ABNORMAL HIGH (ref 8–23)
CO2: 27 mmol/L (ref 22–32)
Calcium: 7.6 mg/dL — ABNORMAL LOW (ref 8.9–10.3)
Chloride: 95 mmol/L — ABNORMAL LOW (ref 98–111)
Creatinine, Ser: 4.81 mg/dL — ABNORMAL HIGH (ref 0.61–1.24)
GFR, Estimated: 12 mL/min — ABNORMAL LOW (ref >60)
Glucose, Bld: 101 mg/dL — ABNORMAL HIGH (ref 70–99)
Phosphorus: 10 mg/dL — ABNORMAL HIGH (ref 2.5–4.6)
Potassium: 4.8 mmol/L (ref 3.5–5.1)
Sodium: 136 mmol/L (ref 135–145)

## 2019-12-24 LAB — GLUCOSE, CAPILLARY
Glucose-Capillary: 105 mg/dL — ABNORMAL HIGH (ref 70–99)
Glucose-Capillary: 127 mg/dL — ABNORMAL HIGH (ref 70–99)
Glucose-Capillary: 135 mg/dL — ABNORMAL HIGH (ref 70–99)
Glucose-Capillary: 84 mg/dL (ref 70–99)
Glucose-Capillary: 87 mg/dL (ref 70–99)
Glucose-Capillary: 99 mg/dL (ref 70–99)

## 2019-12-24 LAB — CBC
HCT: 20.6 % — ABNORMAL LOW (ref 39.0–52.0)
HCT: 22 % — ABNORMAL LOW (ref 39.0–52.0)
Hemoglobin: 6.4 g/dL — CL (ref 13.0–17.0)
Hemoglobin: 7.1 g/dL — ABNORMAL LOW (ref 13.0–17.0)
MCH: 31.4 pg (ref 26.0–34.0)
MCH: 31.4 pg (ref 26.0–34.0)
MCHC: 31.1 g/dL (ref 30.0–36.0)
MCHC: 32.3 g/dL (ref 30.0–36.0)
MCV: 101 fL — ABNORMAL HIGH (ref 80.0–100.0)
MCV: 97.3 fL (ref 80.0–100.0)
Platelets: 319 10*3/uL (ref 150–400)
Platelets: 339 10*3/uL (ref 150–400)
RBC: 2.04 MIL/uL — ABNORMAL LOW (ref 4.22–5.81)
RBC: 2.26 MIL/uL — ABNORMAL LOW (ref 4.22–5.81)
RDW: 15.9 % — ABNORMAL HIGH (ref 11.5–15.5)
RDW: 18.1 % — ABNORMAL HIGH (ref 11.5–15.5)
WBC: 20.8 10*3/uL — ABNORMAL HIGH (ref 4.0–10.5)
WBC: 23.8 10*3/uL — ABNORMAL HIGH (ref 4.0–10.5)
nRBC: 1.6 % — ABNORMAL HIGH (ref 0.0–0.2)
nRBC: 2.2 % — ABNORMAL HIGH (ref 0.0–0.2)

## 2019-12-24 LAB — URINALYSIS, ROUTINE W REFLEX MICROSCOPIC
Bilirubin Urine: NEGATIVE
Glucose, UA: NEGATIVE mg/dL
Ketones, ur: NEGATIVE mg/dL
Nitrite: NEGATIVE
Protein, ur: 30 mg/dL — AB
RBC / HPF: >50 RBC/hpf — ABNORMAL HIGH (ref 0–5)
Specific Gravity, Urine: 1.015 (ref 1.005–1.030)
pH: 5 (ref 5.0–8.0)

## 2019-12-24 LAB — BLOOD GAS, ARTERIAL
Acid-Base Excess: 0.2 mmol/L (ref 0.0–2.0)
Bicarbonate: 28.8 mmol/L — ABNORMAL HIGH (ref 20.0–28.0)
Drawn by: 44126
FIO2: 60
MECHVT: 450 mL
O2 Saturation: 96 %
PEEP: 12 cmH2O
Patient temperature: 98.6
RATE: 35 resp/min
pCO2 arterial: 76.8 mmHg (ref 32.0–48.0)
pH, Arterial: 7.2 — ABNORMAL LOW (ref 7.350–7.450)
pO2, Arterial: 93.3 mmHg (ref 83.0–108.0)

## 2019-12-24 LAB — D-DIMER, QUANTITATIVE: D-Dimer, Quant: 5.05 ug/mL-FEU — ABNORMAL HIGH (ref 0.00–0.50)

## 2019-12-24 LAB — PREPARE RBC (CROSSMATCH)

## 2019-12-24 MED ORDER — MIDAZOLAM HCL 2 MG/2ML IJ SOLN
2.0000 mg | INTRAMUSCULAR | Status: DC | PRN
  Administered 2019-12-24 – 2019-12-25 (×2): 2 mg via INTRAVENOUS
  Filled 2019-12-24 (×2): qty 2

## 2019-12-24 MED ORDER — PHENYLEPHRINE CONCENTRATED 100MG/250ML (0.4 MG/ML) INFUSION SIMPLE
0.0000 ug/min | INTRAVENOUS | Status: DC
  Filled 2019-12-24: qty 250

## 2019-12-24 MED ORDER — NOREPINEPHRINE 4 MG/250ML-% IV SOLN
0.0000 ug/min | INTRAVENOUS | Status: DC
  Administered 2019-12-24: 11 ug/min via INTRAVENOUS
  Administered 2019-12-24: 22 ug/min via INTRAVENOUS
  Administered 2019-12-25: 15 ug/min via INTRAVENOUS
  Administered 2019-12-25: 22 ug/min via INTRAVENOUS
  Administered 2019-12-25: 13 ug/min via INTRAVENOUS
  Administered 2019-12-25: 15 ug/min via INTRAVENOUS
  Administered 2019-12-26: 9 ug/min via INTRAVENOUS
  Administered 2019-12-26: 6 ug/min via INTRAVENOUS
  Administered 2019-12-26: 8 ug/min via INTRAVENOUS
  Administered 2019-12-27: 13 ug/min via INTRAVENOUS
  Administered 2019-12-27: 15 ug/min via INTRAVENOUS
  Filled 2019-12-24 (×11): qty 250

## 2019-12-24 MED ORDER — NOREPINEPHRINE 4 MG/250ML-% IV SOLN
INTRAVENOUS | Status: AC
  Administered 2019-12-24: 2 ug/min via INTRAVENOUS
  Filled 2019-12-24: qty 250

## 2019-12-24 MED ORDER — CHLORHEXIDINE GLUCONATE 0.12 % MT SOLN
OROMUCOSAL | Status: AC
  Administered 2019-12-24: 15 mL via OROMUCOSAL
  Filled 2019-12-24: qty 15

## 2019-12-24 MED ORDER — SODIUM CHLORIDE 0.9 % IV SOLN
2.0000 g | INTRAVENOUS | Status: DC
  Administered 2019-12-24 – 2019-12-26 (×3): 2 g via INTRAVENOUS
  Filled 2019-12-24 (×3): qty 2

## 2019-12-24 MED ORDER — COLLAGENASE 250 UNIT/GM EX OINT
TOPICAL_OINTMENT | Freq: Every day | CUTANEOUS | Status: DC
  Administered 2019-12-29: 1 via TOPICAL
  Filled 2019-12-24: qty 30

## 2019-12-24 MED ORDER — METOCLOPRAMIDE HCL 5 MG/ML IJ SOLN
5.0000 mg | Freq: Three times a day (TID) | INTRAMUSCULAR | Status: AC
  Administered 2019-12-24 – 2019-12-25 (×3): 5 mg via INTRAVENOUS
  Filled 2019-12-24 (×3): qty 2

## 2019-12-24 MED ORDER — SODIUM CHLORIDE 0.9% IV SOLUTION: Freq: Once | INTRAVENOUS | Status: AC

## 2019-12-24 MED ORDER — SODIUM CHLORIDE 0.9 % IV SOLN
INTRAVENOUS | Status: DC | PRN
  Administered 2019-12-24 – 2019-12-25 (×3): 250 mL via INTRAVENOUS
  Administered 2019-12-25: 1000 mL via INTRAVENOUS
  Administered 2019-12-29: 500 mL via INTRAVENOUS

## 2019-12-24 MED ORDER — LINEZOLID 600 MG/300ML IV SOLN
600.0000 mg | Freq: Two times a day (BID) | INTRAVENOUS | Status: DC
  Administered 2019-12-24 – 2019-12-27 (×7): 600 mg via INTRAVENOUS
  Filled 2019-12-24 (×7): qty 300

## 2019-12-24 NOTE — Progress Notes (Signed)
CRITICAL VALUE ALERT  Critical Value:  Hgb 6.4  Date & Time Notied:  1580 12/24/19  Provider Notified: Dr. Silas Flood  Orders Received/Actions taken: awaiting further orders

## 2019-12-24 NOTE — Progress Notes (Signed)
[facility] Progress Note  Subjective: pt seen in ICU, on vent. Creat up today, BP's down and started on 2 pressors overnight.  CXR 10/18 bilat infiltrates c/w covid pna.   Vitals:   12/24/19 0900 12/24/19 1013 12/24/19 1109 12/24/19 1153  BP:    (!) 109/46  Pulse: 91 87 85 88  Resp: (!) 27 (!) 35 (!) 35 (!) 35  Temp:      TempSrc:      SpO2: 91% 100% 100% 100%  Weight:      Height:        Exam: on vent ,sedated  no jvd  throat ett in place  Chest cta bilat and lat  Cor reg no RG  Abd soft ntnd no ascites   Ext diffuse 2+ UE and LE edema   Neuro on vent and sedated, no following commands    RUS 10/12 >  12 cm kidneys w/o hydro   UA 10/13 - 30 prot, rbc >50/ 20-50 wbc/ 0-5 epi/ many bact    10/12 -  UNa 46 / UCr 111  Summary: 69 yo w/ COVID-19 PNA admitted 9/20,  rx'd w/ IV steroids/ remdesivir and baricitinib. Intubated on 10/5.  Had some hypotension after intubation into 70's, was on pressors, and developed AKI.  RUS was wnl. Did get IV contrast on 9/29, neg for PE. Got IV vanc on 10/3 and 10/4. Creat was normal at 0.98 on 10/02. On 10/06 creat started to rise to 2.1 >> and peaked at 4.60 pm 10/13 then improved > 4.0 > 3.70 > 2.94 yest but back up to 3.47 today. Pt is nonoliguric.     Assessment/ Plan: 1. AKI - nonoliguric in pt w/ COVID-19 PNA. ATN most likely due to hypotension, possibly IV vanc x 2 days. Creat peaked at 4.60 on 1013, then improved down to 2.9 on 10/17. Started IV lasix 2d ago due to vol overload. Overnight UOP dropped and BP's dropped. Now B/Cr back up again. Lasix IV stopped for now. Not absolute indication for RRT, but azotemia is getting worse. Hopefully BP's will stabilize and UOP will improve. Have d/w CCM attending.  2. Hypotension - pressors restarted yest pm w/ neo and levo gtt's 3. Volume overload - diffuse 2-3+ pitting edema 4. ARDS d/t COVID PNA - per CCM 5. Shock - sp IV cefepime, levo gtt weaned off 10/7 6. CAD hx  stent     Rob Lujean Ebright 12/24/2019, 12:40 PM   Recent Labs  Lab 12/22/19 0554 12/22/19 1728 12/23/19 0300 12/24/19 0520 12/24/19 1226  K 4.0   < > 3.3* 4.8  --   BUN 174*   < > 144* 207*  --   CREATININE 3.47*   < > 3.03* 4.81*  --   CALCIUM 7.8*   < > 6.4* 7.6*  --   PHOS 6.7*  --  6.3* 10.0*  --   HGB 7.1*  --   --   --  6.4*   < > = values in this interval not displayed.   Inpatient medications: . chlorhexidine gluconate (MEDLINE KIT)  15 mL Mouth Rinse BID  . Chlorhexidine Gluconate Cloth  6 each Topical Daily  . clopidogrel  75 mg Per Tube Daily  . darbepoetin (ARANESP) injection - NON-DIALYSIS  100 mcg Subcutaneous Q Sat-1800  . docusate  100 mg Per Tube BID  . feeding supplement (PROSource TF)  90 mL Per Tube BID  . free water  200 mL Per Tube Q4H  .  heparin injection (subcutaneous)  5,000 Units Subcutaneous Q8H  . insulin aspart  0-9 Units Subcutaneous Q4H  . mouth rinse  15 mL Mouth Rinse 10 times per day  . metoCLOPramide (REGLAN) injection  5 mg Intravenous Q8H  . pantoprazole sodium  40 mg Per Tube Q1200  . polyethylene glycol  17 g Per Tube Daily  . sevelamer carbonate  800 mg Per Tube TID WC  . sodium chloride flush  10-40 mL Intracatheter Q12H   . feeding supplement (NEPRO CARB STEADY) 60 mL/hr at 12/23/19 0400  . fentaNYL infusion INTRAVENOUS 400 mcg/hr (12/24/19 1200)  . norepinephrine (LEVOPHED) Adult infusion 9 mcg/min (12/24/19 1238)  . phenylephrine (NEO-SYNEPHRINE) Adult infusion     acetaminophen (TYLENOL) oral liquid 160 mg/5 mL, albuterol, fentaNYL, hydrALAZINE, oxymetazoline, sodium chloride flush

## 2019-12-24 NOTE — Progress Notes (Signed)
12/24/2019 CRITICAL VALUE STICKER  CRITICAL VALUE: ABG pCO2 76.8 and pH 7.200  RECEIVER (on-site recipient of call): Tyrell Antonio RN  DATE & TIME NOTIFIED: 12/24/2019 12:20PM  MESSENGER (representative from lab): Tammy  MD NOTIFIED: Larey Days MD  TIME OF NOTIFICATION: 12/24/2019 12:30 PM  RESPONSE: Acknowledged, will consider if change needed.  Cindy S. Brigitte Pulse BSN, RN, CCRP 12/24/2019 12:30 PM

## 2019-12-24 NOTE — Progress Notes (Signed)
PCCM Progress Note  ABG stable with slowly declining pH. pCO2 stable, bicarb down slightly with worsening kidney function. Dyssynchronous, PRN versed added.  Hypotensive overnight was off pressors now back on. CVP 10, do not think volume down. CBC with mild decrease in HGB to 6.6 but does not explain hypotension. 1u pRBC ordered. Given hypotension, worry about sepsis - unclear source but with worsening oxygenation and secretions suspect HCAP. Sputum culture and UA sent. Unable to obtain peripheral blood cultures due to edema. Cefepime and linezolid added.   Discussed with patient wife at bedside. Concerned with lack or progress. Will consider time limited trial of dialysis if needed.   CRITICAL CARE Performed by: Bonna Gains Kareen Hitsman   Total critical care time: 30 minutes  Critical care time was exclusive of separately billable procedures and treating other patients.  Critical care was necessary to treat or prevent imminent or life-threatening deterioration.  Critical care was time spent personally by me on the following activities: development of treatment plan with patient and/or surrogate as well as nursing, discussions with consultants, evaluation of patient's response to treatment, examination of patient, obtaining history from patient or surrogate, ordering and performing treatments and interventions, ordering and review of laboratory studies, ordering and review of radiographic studies, pulse oximetry and re-evaluation of patient's condition.

## 2019-12-24 NOTE — Progress Notes (Signed)
NAME:  Ronald Olsen, MRN:  [id number], DOB:  22-Sep-1950, LOS: 74 ADMISSION DATE:  11/26/2019, CONSULTATION DATE:  10/4 REFERRING MD:  Doreatha Martin, CHIEF COMPLAINT:  Respiratory failure in setting of COVID-19   Brief History   69 year old male patient admitted on 9/20 with Covid pneumonia. Treated with supplemental oxygen, systemic steroids, remdesivir and baricitinib. Progressive resp failure, intubated 10/5. Evolving ARF. Treated empirically for possible HCAP.   Past Medical History  Hypertension, dyslipidemia. Coronary artery disease  Significant Hospital Events   9/20  Admit, COVID positive, PCT <0.10, BNP neg. High dose steroids, Remdesivir and baricitinib.  9/21 HFNC 15 liters    9/22 CRP down 3.4 but still on high FIO2 9/25 Completed day 5 of Remdesivir. Still on high dose steroids and Baricitinib  9/26 Increased oxygen demand. Placed on NRB and high flow.  9/28 Rapid desaturation w/ any movement  9/29 CT angio negative for PE  10/4 Worsening resp failure, rapid response called. PCCM asked to see w/ tranfers to ICU 10/5 Intubated. Replaced w larger 8.0 ETT 10/5 for intermittent cuffleak. Epistaxis  10/6  Prone positioning -FiO2 0.60, PEEP 10 ,Continued oozing from nare, even after packing.  Heparin held 10/8 FiO2 increased to 60% due to desaturation 10/9 Nasal packing removed 10/11 Ongoing oozing from right nares, draining into mouth, vent dyssynchrony.  Heparin SQ held. P/F 87, prone 10/13 Nose repacked after review with ENT 10/14 Weaned to fentanyl gtt only, followed commands  10/17 weaned again to fentanyl (versed resumed over weekend) 10/18 attempted diuresis, still net positive, Cr slightly improved 10/19 went down on fent gtt overnight, dyssynchronous FiO2 increased, weaning FiO2 back down with pO2 126  Consults:  Pulmonary consulted 10/4 ENT 10/9, 10/13  Procedures:  ETT 10/5 >>  LUE PICC 10/5 >>  R Femoral ALine 10/6 >> 10/12 R Radial Aline 10/15  >>  Significant Diagnostic Tests:  9/29 CT Chest 9/29 >> multifocal groundglass attenuations bilaterally through all lobes 10/5 ECHO >> Intact LV function 31-54%, grade 1 diastolic dysfunction, normal RV function and size 10/7 LE Venous duplex >> negative 10/12 Renal US >> unremarkable kidneys, decompressed bladder but diffuse wall thickening  Micro Data:  COVID 9/29 >> Positive BCx2 9/20 >> Negative  Antimicrobials:  Remdesivir 9/20 >> 9/25 Vanc 10/3 >> 10/6 Ceftriaxone 10/3 >> 10/4 Cefepime 10/4 >> 10/9 Azitho 10/3 >> 10/6  Interim history/subjective:   FiO2 relatively stable. Bps down overnight, on low dose phenyl. UOP way down despite aggressive lasix, Cr rising. Electrolytes ok.  Objective   Blood pressure (!) 80/39, pulse 85, temperature 97.6 F (36.4 C), temperature source Axillary, resp. rate (!) 31, height 5\' 11"  (1.803 m), weight 104.8 kg, SpO2 99 %. CVP:  [6 mmHg-10 mmHg] 6 mmHg  Vent Mode: PRVC FiO2 (%):  [60 %-80 %] 60 % Set Rate:  [35 bmp] 35 bmp Vt Set:  [450 mL] 450 mL PEEP:  [10 cmH20] 10 cmH20 Plateau Pressure:  [26 cmH20-35 cmH20] 34 cmH20   Intake/Output Summary (Last 24 hours) at 12/24/2019 0844 Last data filed at 12/24/2019 0644 Gross per 24 hour  Intake 2412.6 ml  Output 300 ml  Net 2112.6 ml   Filed Weights   12/20/19 0444 12/21/19 0408 12/22/19 0424  Weight: 102.8 kg 104.5 kg 104.8 kg    Examination: Gen:      No acute distress, sedated HEENT:  EOMI, sclera anicteric Neck:     No masses; no thyromegaly, ETT Lungs:    Coarse ventilated sounds; normal respiratory  effort CV:         Regular rate and rhythm; no murmurs Abd:      + bowel sounds; soft, non-tender; no palpable masses, no distension Ext:    Pitting edema bilaterally in UEs; adequate peripheral perfusion Skin:      Warm and dry; no rash Neuro:  Sedated, does not follow commands  Labs/imaging personally reviewed CXR 10/18 lower volumes, unchanged to slightly worse  infiltrates  Resolved Hospital Problem list   Mixed acidosis - Bicarb weaned to off 10/6 Severe leukocytosis, suspect steroid and stress  Assessment & Plan:   ARDS secondary to COVID PNA Completed remdesivir, baricitinib, steroids.  ETT with cuff leak 10/5, changed.  Off precautions 10/18 Continue low tidal volume ventilation Follow intermittent chest x-ray Wean FiO2 and PEEP as tolerated  Acute Metabolic Encephalopathy  Sedation Needs while on Vent Weaning sedation.  Shock In the setting of sedating medications + presumed hypovolemia given no response in UOP to lasix and rising Cr.   -MAP > 65, pressors as needed  Rising D-dimer:  -Repeat LE Dopplers given increase in O2, has been on ppx heparin  Leukocytosis Afebrile, may be nasal packing  Follow CBC  CAD  Hx stent on plavix -continue plavix  -hold lipitor with elevated CK   AKI, Oliguric  Negative renal US, CK 621.  Likely ATN from hypotension.  Cr rising. Appears overloaded on exam but did not put out to high dose lasix. Discussed with nephrology face to face. -Transduce flat CVP -No lasix -Discuss possibility of CVVHD with wife, significant implications with likely eventual need for trach if he recovers, poor prognostic sign   Epistaxis Suspect traumatic from NG tube attempt. LE duplex negative, ECHO negative.   Enoxaparin, aspirin, Plavix held. heparin gtt stopped on 10/6.  Re-packed 10/13, on DVT ppx.  -nasal packing for 7 days, removed 10/20 -follow Hgb trend  -PRN afrin  Macrocytic Anemia  B12 964 Follow CBC  Hyperglycemia Mild, suspect steroid-induced -SSI   Best practice:  Diet: NPO, TF  Pain/Anxiety/Delirium protocol (if indicated): PAD protocol  VAP protocol (if indicated): Ordered DVT prophylaxis: SCD's, heparin restarted 10/12, see above  GI prophylaxis: PPI Glucose control: SSI    Mobility: Bedrest Code Status: DNR Family Communication: son  updated 10/19 at bedside  Disposition: ICU    Critical care time:   CRITICAL CARE Performed by: Bonna Gains Daveda Larock   Total critical care time: 45 minutes  Critical care time was exclusive of separately billable procedures and treating other patients.  Critical care was necessary to treat or prevent imminent or life-threatening deterioration.  Critical care was time spent personally by me on the following activities: development of treatment plan with patient and/or surrogate as well as nursing, discussions with consultants, evaluation of patient's response to treatment, examination of patient, obtaining history from patient or surrogate, ordering and performing treatments and interventions, ordering and review of laboratory studies, ordering and review of radiographic studies, pulse oximetry and re-evaluation of patient's condition.

## 2019-12-24 NOTE — Consult Note (Signed)
[place] Nurse wound follow up Wound type:Deep tissue injury to sacrum Measurement:8 cm x 8 cm peeling epithelium reveals red wound bed.   Wound bed:see above Drainage (amount, consistency, odor)  Minimal serosanguinous Periwound: 3 cm erythema present circumferentially Dressing procedure/placement/frequency: Due to peeling epithelium, will implement daily wound care with santyl.  Cleanse sacral wound with NS and pat dry.  Apply santyl to wound bed.  Top with NS moist gauze and secure with dry gauze and ABD pad/tape. Change daily.  Will not follow at this time.  Please re-consult if needed.  Domenic Moras MSN, RN, FNP-BC CWON Wound, Ostomy, Continence Nurse Pager [phone]

## 2019-12-24 NOTE — TOC Progression Note (Signed)
Transition of Care [facility]) - Progression Note    Patient Details  Name: Ronald Olsen MRN: [id number] Date of Birth: [birth year]  Transition of Care [facility]) CM/SW Contact  Leeroy Cha, RN Phone Number: 12/24/2019, 8:01 AM  Clinical Narrative:    [place] Hospital Events   9/20  Admit, COVID positive, PCT <0.10, BNP neg. High dose steroids, Remdesivir and baricitinib.  9/21 HFNC 15 liters    9/22 CRP down 3.4 but still on high FIO2 9/25 Completed day 5 of Remdesivir. Still on high dose steroids and Baricitinib  9/26 Increased oxygen demand. Placed on NRB and high flow.  9/28 Rapid desaturation w/ any movement  9/29 CT angio negative for PE  10/4 Worsening resp failure, rapid response called. PCCM asked to see w/ tranfers to ICU 10/5 Intubated. Replaced w larger 8.0 ETT 10/5 for intermittent cuffleak. Epistaxis  10/6  Prone positioning -FiO2 0.60, PEEP 10 ,Continued oozing from nare, even after packing.  Heparin held 10/8 FiO2 increased to 60% due to desaturation 10/9 Nasal packing removed 10/11 Ongoing oozing from right nares, draining into mouth, vent dyssynchrony.  Heparin SQ held. P/F 87, prone 10/13 Nose repacked after review with ENT 10/14 Weaned to fentanyl gtt only, followed commands  10/17 weaned again to fentanyl (versed resumed over weekend) 10/18 attempted diuresis, still net positive, Cr slightly improved 10/19 went down on fent gtt overnight, dyssynchronous FiO2 increased, weaning FiO2 back down with pO2 126  Remains on the vent at 60%, bp 88/40-142/56 receiving iv neo, bun 207/creat 4.81, gfr estimate 12, d. Dimer 5.05, May in ltach placement once map is stable.   Expected Discharge Plan: [place] Barriers to Discharge: Barriers Unresolved (comment)  Expected Discharge Plan and Services Expected Discharge Plan: [place]   Discharge Planning Services: CM Consult   Living arrangements for the past 2 months:  Single Family Home                                       Social Determinants of Health (SDOH) Interventions    Readmission Risk Interventions No flowsheet data found.

## 2019-12-24 NOTE — Progress Notes (Signed)
PHARMACY NOTE:  ANTIMICROBIAL RENAL DOSAGE ADJUSTMENT  Current antimicrobial regimen includes a mismatch between antimicrobial dosage and estimated renal function.  As per policy approved by the Pharmacy & Therapeutics and Medical Executive Committees, the antimicrobial dosage will be adjusted accordingly.  Current antimicrobial dosage:  Cefepime 1gm q24h  Indication: PNA  Renal Function:  Estimated Creatinine Clearance: 18.4 mL/min (A) (by C-G formula based on SCr of 4.81 mg/dL (H)). []      On intermittent HD, scheduled: []      On CRRT    Antimicrobial dosage has been changed to:  Cefepime 2gm q24h    Thank you for allowing pharmacy to be a part of this patient's care.  Lynelle Doctor, [facility] 12/24/2019 1:52 PM

## 2019-12-24 NOTE — Progress Notes (Signed)
Lower extremity venous has been completed.   Preliminary results in CV Proc.   Abram Sander 12/24/2019 9:13 AM

## 2019-12-25 DIAGNOSIS — J9601 Acute respiratory failure with hypoxia: Secondary | ICD-10-CM | POA: Diagnosis not present

## 2019-12-25 DIAGNOSIS — U071 COVID-19: Secondary | ICD-10-CM | POA: Diagnosis not present

## 2019-12-25 LAB — BLOOD GAS, ARTERIAL
Acid-base deficit: 2 mmol/L (ref 0.0–2.0)
Bicarbonate: 26.4 mmol/L (ref 20.0–28.0)
Drawn by: 560031
FIO2: 60
MECHVT: 450 mL
O2 Saturation: 95.6 %
PEEP: 12 cmH2O
Patient temperature: 98.8
RATE: 35 resp/min
pCO2 arterial: 75.2 mmHg (ref 32.0–48.0)
pH, Arterial: 7.172 — CL (ref 7.350–7.450)
pO2, Arterial: 84.9 mmHg (ref 83.0–108.0)

## 2019-12-25 LAB — RENAL FUNCTION PANEL
Albumin: 1.9 g/dL — ABNORMAL LOW (ref 3.5–5.0)
Anion gap: 13 (ref 5–15)
BUN: 208 mg/dL — ABNORMAL HIGH (ref 8–23)
CO2: 24 mmol/L (ref 22–32)
Calcium: 7.2 mg/dL — ABNORMAL LOW (ref 8.9–10.3)
Chloride: 98 mmol/L (ref 98–111)
Creatinine, Ser: 5.1 mg/dL — ABNORMAL HIGH (ref 0.61–1.24)
GFR, Estimated: 11 mL/min — ABNORMAL LOW (ref >60)
Glucose, Bld: 147 mg/dL — ABNORMAL HIGH (ref 70–99)
Phosphorus: 10.5 mg/dL — ABNORMAL HIGH (ref 2.5–4.6)
Potassium: 4.8 mmol/L (ref 3.5–5.1)
Sodium: 135 mmol/L (ref 135–145)

## 2019-12-25 LAB — TYPE AND SCREEN
ABO/RH(D): A POS
Antibody Screen: NEGATIVE
Unit division: 0

## 2019-12-25 LAB — GLUCOSE, CAPILLARY
Glucose-Capillary: 130 mg/dL — ABNORMAL HIGH (ref 70–99)
Glucose-Capillary: 105 mg/dL — ABNORMAL HIGH (ref 70–99)
Glucose-Capillary: 110 mg/dL — ABNORMAL HIGH (ref 70–99)
Glucose-Capillary: 150 mg/dL — ABNORMAL HIGH (ref 70–99)
Glucose-Capillary: 130 mg/dL — ABNORMAL HIGH (ref 70–99)

## 2019-12-25 LAB — CBC
HCT: 22.4 % — ABNORMAL LOW (ref 39.0–52.0)
Hemoglobin: 7.2 g/dL — ABNORMAL LOW (ref 13.0–17.0)
MCH: 31.3 pg (ref 26.0–34.0)
MCHC: 32.1 g/dL (ref 30.0–36.0)
MCV: 97.4 fL (ref 80.0–100.0)
Platelets: 323 10*3/uL (ref 150–400)
RBC: 2.3 MIL/uL — ABNORMAL LOW (ref 4.22–5.81)
RDW: 18.6 % — ABNORMAL HIGH (ref 11.5–15.5)
WBC: 20.8 10*3/uL — ABNORMAL HIGH (ref 4.0–10.5)
nRBC: 2.3 % — ABNORMAL HIGH (ref 0.0–0.2)

## 2019-12-25 LAB — BPAM RBC
Blood Product Expiration Date: 202111022359
ISSUE DATE / TIME: 202110202013
Unit Type and Rh: 6200

## 2019-12-25 LAB — D-DIMER, QUANTITATIVE: D-Dimer, Quant: 5.67 ug/mL-FEU — ABNORMAL HIGH (ref 0.00–0.50)

## 2019-12-25 MED ORDER — VASOPRESSIN 20 UNITS/100 ML INFUSION FOR SHOCK
0.0300 [IU]/min | INTRAVENOUS | Status: DC
  Administered 2019-12-25 (×2): 0.03 [IU]/min via INTRAVENOUS
  Filled 2019-12-25 (×2): qty 100

## 2019-12-25 MED ORDER — SEVELAMER CARBONATE 800 MG PO TABS
1600.0000 mg | ORAL_TABLET | Freq: Three times a day (TID) | ORAL | Status: DC
  Filled 2019-12-25: qty 2

## 2019-12-25 MED ORDER — SODIUM BICARBONATE 650 MG PO TABS
650.0000 mg | ORAL_TABLET | Freq: Three times a day (TID) | ORAL | Status: DC
  Administered 2019-12-25 (×3): 650 mg via ORAL
  Filled 2019-12-25 (×3): qty 1

## 2019-12-25 MED ORDER — SEVELAMER CARBONATE 2.4 G PO PACK
2.4000 g | PACK | Freq: Three times a day (TID) | ORAL | Status: DC
  Administered 2019-12-25 – 2019-12-30 (×14): 2.4 g
  Filled 2019-12-25 (×22): qty 1

## 2019-12-25 MED ORDER — MIDAZOLAM HCL 2 MG/2ML IJ SOLN
2.0000 mg | INTRAMUSCULAR | Status: DC | PRN
  Administered 2019-12-25 – 2019-12-30 (×25): 2 mg via INTRAVENOUS
  Filled 2019-12-25 (×24): qty 2

## 2019-12-25 NOTE — Progress Notes (Signed)
CRITICAL VALUE ALERT  Critical Value:  PH 7.172 PCO2 75.2  Date & Time Notied:   12/25/19 0750  Provider Notified: [place]   Orders Received/Actions taken:  No new orders at this time.

## 2019-12-25 NOTE — Progress Notes (Signed)
[facility] Progress Note  Subjective: pt seen in ICU. UOP 550 yest, a little better. Levo gtt down from 22 > 15 ug/min.  Started on IV abx yest zyvox and Cefepime. CVP's 9-11.   Vitals:   12/25/19 0530 12/25/19 0600 12/25/19 0630 12/25/19 0748  BP:    (!) 121/53  Pulse: 89 84 85 85  Resp: (!) 35 (!) 35 (!) 35 (!) 35  Temp:    97.8 F (36.6 C)  TempSrc:    Oral  SpO2: 97% 97% 96% 97%  Weight: 110.5 kg     Height:        Exam: on vent ,sedated  no jvd  throat ett in place  Chest cta bilat and lat  Cor reg no RG  Abd soft ntnd no ascites   Ext diffuse 2+ UE and LE edema   Neuro on vent and sedated, no following commands    RUS 10/12 >  12 cm kidneys w/o hydro   UA 10/13 - 30 prot, rbc >50/ 20-50 wbc/ 0-5 epi/ many bact    10/12 -  UNa 46 / UCr 111  Summary: 69 yo w/ COVID-19 PNA admitted 9/20,  rx'd w/ IV steroids/ remdesivir and baricitinib. Intubated on 10/5.  Had some hypotension after intubation into 70's, was on pressors, and developed AKI.  RUS was wnl. Did get IV contrast on 9/29, neg for PE. Got IV vanc on 10/3 and 10/4. Creat was normal at 0.98 on 10/02. On 10/06 creat started to rise to 2.1 >> and peaked at 4.60 pm 10/13 then improved > 4.0 > 3.70 > 2.94 yest but back up to 3.47 today. Pt is nonoliguric.     Assessment/ Plan: 1. AKI - nonoliguric in pt w/ COVID-19 PNA. ATN most likely due to hypotension, acute critical illness. Creat peaked at 4.60 on 1013, then improved down to 2.9 on 10/17. Then BP's and UOP dropped and creat rising again now up to 5.0 today.  Resp acidosis, pH 4.49 w/o metabolic compensation. CVP's normal. Vol overload on exam. No absolute indication for RRT yet. Have d/w CCM. Will follow.  2. Hypotension - on levo and vaso gtt's 3. Volume overload - diffuse 2 + pitting edema 4. ARDS d/t COVID PNA - per CCM 5. Sepsis - restarted on IV abx (zyvox/ cefepime) 10/20 for suspected HCAP.  6. CAD hx stent     Rob Jamilet Ambroise 12/25/2019,  11:37 AM   Recent Labs  Lab 12/24/19 0520 12/24/19 1226 12/24/19 2345 12/25/19 0103 12/25/19 0326  K 4.8  --   --   --  4.8  BUN 207*  --   --   --  208*  CREATININE 4.81*  --   --   --  5.10*  CALCIUM 7.6*  --   --   --  7.2*  PHOS 10.0*  --   --   --  10.5*  HGB  --    < > 7.1* 7.2*  --    < > = values in this interval not displayed.   Inpatient medications: . chlorhexidine gluconate (MEDLINE KIT)  15 mL Mouth Rinse BID  . Chlorhexidine Gluconate Cloth  6 each Topical Daily  . clopidogrel  75 mg Per Tube Daily  . collagenase   Topical Daily  . darbepoetin (ARANESP) injection - NON-DIALYSIS  100 mcg Subcutaneous Q Sat-1800  . docusate  100 mg Per Tube BID  . feeding supplement (PROSource TF)  90 mL Per Tube BID  .  free water  200 mL Per Tube Q4H  . heparin injection (subcutaneous)  5,000 Units Subcutaneous Q8H  . insulin aspart  0-9 Units Subcutaneous Q4H  . mouth rinse  15 mL Mouth Rinse 10 times per day  . pantoprazole sodium  40 mg Per Tube Q1200  . polyethylene glycol  17 g Per Tube Daily  . sevelamer carbonate  2.4 g Per Tube TID WC  . sodium bicarbonate  650 mg Oral TID  . sodium chloride flush  10-40 mL Intracatheter Q12H   . sodium chloride 1,000 mL (12/25/19 0949)  . ceFEPime (MAXIPIME) IV Stopped (12/24/19 1501)  . feeding supplement (NEPRO CARB STEADY) 1,000 mL (12/24/19 1340)  . fentaNYL infusion INTRAVENOUS 400 mcg/hr (12/25/19 0949)  . linezolid (ZYVOX) IV Stopped (12/25/19 1683)  . norepinephrine (LEVOPHED) Adult infusion 15 mcg/min (12/25/19 0949)  . vasopressin 0.03 Units/min (12/25/19 1000)   sodium chloride, acetaminophen (TYLENOL) oral liquid 160 mg/5 mL, albuterol, fentaNYL, hydrALAZINE, midazolam, oxymetazoline, sodium chloride flush

## 2019-12-25 NOTE — Progress Notes (Signed)
NAME:  Ronald Olsen, MRN:  [id number], DOB:  1950-08-26, LOS: 73 ADMISSION DATE:  12/02/2019, CONSULTATION DATE:  10/4 REFERRING MD:  Doreatha Martin, CHIEF COMPLAINT:  Respiratory failure in setting of COVID-19   Brief History   69 year old male patient admitted on 9/20 with Covid pneumonia. Treated with supplemental oxygen, systemic steroids, remdesivir and baricitinib. Progressive resp failure, intubated 10/5. Evolving ARF. Treated empirically for possible HCAP.   Past Medical History  Hypertension, dyslipidemia. Coronary artery disease  Significant Hospital Events   9/20  Admit, COVID positive, PCT <0.10, BNP neg. High dose steroids, Remdesivir and baricitinib.  9/21 HFNC 15 liters    9/22 CRP down 3.4 but still on high FIO2 9/25 Completed day 5 of Remdesivir. Still on high dose steroids and Baricitinib  9/26 Increased oxygen demand. Placed on NRB and high flow.  9/28 Rapid desaturation w/ any movement  9/29 CT angio negative for PE  10/4 Worsening resp failure, rapid response called. PCCM asked to see w/ tranfers to ICU 10/5 Intubated. Replaced w larger 8.0 ETT 10/5 for intermittent cuffleak. Epistaxis  10/6  Prone positioning -FiO2 0.60, PEEP 10 ,Continued oozing from nare, even after packing.  Heparin held 10/8 FiO2 increased to 60% due to desaturation 10/9 Nasal packing removed 10/11 Ongoing oozing from right nares, draining into mouth, vent dyssynchrony.  Heparin SQ held. P/F 87, prone 10/13 Nose repacked after review with ENT 10/14 Weaned to fentanyl gtt only, followed commands  10/17 weaned again to fentanyl (versed resumed over weekend) 10/18 attempted diuresis, still net positive, Cr slightly improved 10/19 went down on fent gtt overnight, dyssynchronous FiO2 increased, weaning FiO2 back down with pO2 126 10/20 Worsening renal function, stable pCO2 but worsening metabolic acidosis   Consults:  Pulmonary consulted 10/4 ENT 10/9, 10/13  Procedures:  ETT 10/5 >>  LUE  PICC 10/5 >>  R Femoral ALine 10/6 >> 10/12 R Radial Aline 10/15 >>  Significant Diagnostic Tests:  9/29 CT Chest 9/29 >> multifocal groundglass attenuations bilaterally through all lobes 10/5 ECHO >> Intact LV function 28-00%, grade 1 diastolic dysfunction, normal RV function and size 10/7 LE Venous duplex >> negative 10/12 Renal US >> unremarkable kidneys, decompressed bladder but diffuse wall thickening  Micro Data:  COVID 9/29 >> Positive BCx2 9/20 >> Negative  Antimicrobials:  Remdesivir 9/20 >> 9/25 Vanc 10/3 >> 10/6 Ceftriaxone 10/3 >> 10/4 Cefepime 10/4 >> 10/9 Azitho 10/3 >> 10/6 Unasyn 10/13>>10/20 (ppx for nasal packing) Cefepime, linezolid 10/20 >>  Interim history/subjective:   Vent relatively stable. Remains on pressors. LRCx with gram positives. Started cefepime/vanc yday given pressor requirement and presumed infection.   Objective   Blood pressure (!) 121/53, pulse 85, temperature 97.8 F (36.6 C), temperature source Oral, resp. rate (!) 35, height 5\' 11"  (1.803 m), weight 110.5 kg, SpO2 97 %. CVP:  [8 mmHg-11 mmHg] 9 mmHg  Vent Mode: PRVC FiO2 (%):  [60 %-70 %] 60 % Set Rate:  [35 bmp] 35 bmp Vt Set:  [450 mL] 450 mL PEEP:  [12 cmH20] 12 cmH20 Plateau Pressure:  [16 cmH20-39 cmH20] 39 cmH20   Intake/Output Summary (Last 24 hours) at 12/25/2019 1026 Last data filed at 12/25/2019 0949 Gross per 24 hour  Intake 3712.41 ml  Output 475 ml  Net 3237.41 ml   Filed Weights   12/21/19 0408 12/22/19 0424 12/25/19 0530  Weight: 104.5 kg 104.8 kg 110.5 kg    Examination: Gen:      No acute distress, sedated HEENT:  EOMI,  sclera anicteric Neck:     No masses; no thyromegaly, ETT Lungs:    Coarse ventilated sounds; normal respiratory effort CV:         Regular rate and rhythm; no murmurs Abd:      + bowel sounds; soft, non-tender; no palpable masses, no distension Ext:    Pitting edema bilaterally in UEs; adequate peripheral perfusion Skin:      Warm and  dry; no rash Neuro:  Sedated, does not follow commands  Labs/imaging personally reviewed CXR 10/18 lower volumes, unchanged to slightly worse infiltrates    Assessment & Plan:   ARDS secondary to COVID PNA: Overall no improvement. Worsened dead space over time. FiO2 and PEEP going up. Combo of edema, evolving lung injury, possible pneumonia. -Completed remdesivir, baricitinib, steroids.  ETT with cuff leak 10/5, changed.  -Off precautions 10/18 -Continue low tidal volume ventilation -Follow intermittent chest x-ray -Wean FiO2 and PEEP as tolerated  Acute Metabolic Encephalopathy  Sedation Needs while on Vent Unfortunately anticipate incresing sedation needs with worsening respiratory status.  -Maxed out fentanyl drip -Versed PRN  Shock HCAP: Thick secretions new pressor requirement 10/20. LRCX with gram positives In the setting of sedating medications + presumed septic shock. Do not think hypovolemic.    -MAP > 65, pressors as needed -cefepime, linezolid 10/20 -->   Rising D-dimer:  -Repeat LE Dopplers given increase in O2 negative 10/20  CAD  Hx stent on plavix -continue plavix  -hold lipitor with elevated CK   AKI, Oliguric  Negative renal US, CK 621.  Likely ATN from hypotension and hypoxemia and critical illness.  Cr rising. Appears overloaded on exam but did not put out to high dose lasix. CVP 10. Not hypovolemic.  -No lasix -Discuss possibility of CVVHD with wife, significant implications with likely eventual need for trach if he recovers, poor prognostic sign - tentative plan for time limited trial of dialysis if needed   Epistaxis (resolved) Suspect traumatic from NG tube attempt. LE duplex negative, ECHO negative.   Enoxaparin, aspirin, Plavix held. heparin gtt stopped on 10/6.  Re-packed 10/13, on DVT ppx. Nasal packing removed 10/20.  Anemia : due to phlebotomy, critical illness, chronic disease/inflammation  Hyperglycemia Mild, suspect  steroid-induced -SSI   Best practice:  Diet: NPO, TF  Pain/Anxiety/Delirium protocol (if indicated): PAD protocol  VAP protocol (if indicated): Ordered DVT prophylaxis: SCD's, heparin restarted 10/12, see above  GI prophylaxis: PPI Glucose control: SSI    Mobility: Bedrest Code Status: DNR Family Communication: wife updated 10/20 at bedside  Disposition: ICU   Critical care time:   CRITICAL CARE Performed by: Bonna Gains Talajah Slimp   Total critical care time: 45 minutes  Critical care time was exclusive of separately billable procedures and treating other patients.  Critical care was necessary to treat or prevent imminent or life-threatening deterioration.  Critical care was time spent personally by me on the following activities: development of treatment plan with patient and/or surrogate as well as nursing, discussions with consultants, evaluation of patient's response to treatment, examination of patient, obtaining history from patient or surrogate, ordering and performing treatments and interventions, ordering and review of laboratory studies, ordering and review of radiographic studies, pulse oximetry and re-evaluation of patient's condition.

## 2019-12-26 ENCOUNTER — Inpatient Hospital Stay (HOSPITAL_COMMUNITY): Payer: Medicare Other

## 2019-12-26 DIAGNOSIS — U071 COVID-19: Secondary | ICD-10-CM | POA: Diagnosis not present

## 2019-12-26 DIAGNOSIS — N179 Acute kidney failure, unspecified: Secondary | ICD-10-CM | POA: Diagnosis not present

## 2019-12-26 DIAGNOSIS — J9601 Acute respiratory failure with hypoxia: Secondary | ICD-10-CM | POA: Diagnosis not present

## 2019-12-26 LAB — GLUCOSE, CAPILLARY
Glucose-Capillary: 110 mg/dL — ABNORMAL HIGH (ref 70–99)
Glucose-Capillary: 110 mg/dL — ABNORMAL HIGH (ref 70–99)
Glucose-Capillary: 119 mg/dL — ABNORMAL HIGH (ref 70–99)
Glucose-Capillary: 120 mg/dL — ABNORMAL HIGH (ref 70–99)
Glucose-Capillary: 128 mg/dL — ABNORMAL HIGH (ref 70–99)
Glucose-Capillary: 142 mg/dL — ABNORMAL HIGH (ref 70–99)
Glucose-Capillary: 177 mg/dL — ABNORMAL HIGH (ref 70–99)

## 2019-12-26 LAB — BLOOD GAS, ARTERIAL
Acid-base deficit: 4.7 mmol/L — ABNORMAL HIGH (ref 0.0–2.0)
Bicarbonate: 25.2 mmol/L (ref 20.0–28.0)
FIO2: 50
MECHVT: 450 mL
O2 Saturation: 81.3 %
PEEP: 12 cmH2O
Patient temperature: 37
RATE: 35 resp/min
pCO2 arterial: 83.7 mmHg (ref 32.0–48.0)
pH, Arterial: 7.106 — CL (ref 7.350–7.450)
pO2, Arterial: 55.3 mmHg — ABNORMAL LOW (ref 83.0–108.0)

## 2019-12-26 LAB — CBC
HCT: 24.4 % — ABNORMAL LOW (ref 39.0–52.0)
Hemoglobin: 7.6 g/dL — ABNORMAL LOW (ref 13.0–17.0)
MCH: 30.4 pg (ref 26.0–34.0)
MCHC: 31.1 g/dL (ref 30.0–36.0)
MCV: 97.6 fL (ref 80.0–100.0)
Platelets: 302 10*3/uL (ref 150–400)
RBC: 2.5 MIL/uL — ABNORMAL LOW (ref 4.22–5.81)
RDW: 18.6 % — ABNORMAL HIGH (ref 11.5–15.5)
WBC: 19.3 10*3/uL — ABNORMAL HIGH (ref 4.0–10.5)
nRBC: 0.7 % — ABNORMAL HIGH (ref 0.0–0.2)

## 2019-12-26 LAB — D-DIMER, QUANTITATIVE: D-Dimer, Quant: 5.27 ug{FEU}/mL — ABNORMAL HIGH (ref 0.00–0.50)

## 2019-12-26 LAB — CULTURE, RESPIRATORY W GRAM STAIN: Culture: NORMAL

## 2019-12-26 LAB — BASIC METABOLIC PANEL
Anion gap: 15 (ref 5–15)
BUN: 225 mg/dL — ABNORMAL HIGH (ref 8–23)
CO2: 26 mmol/L (ref 22–32)
Calcium: 7.9 mg/dL — ABNORMAL LOW (ref 8.9–10.3)
Chloride: 95 mmol/L — ABNORMAL LOW (ref 98–111)
Creatinine, Ser: 6.09 mg/dL — ABNORMAL HIGH (ref 0.61–1.24)
GFR, Estimated: 9 mL/min — ABNORMAL LOW (ref >60)
Glucose, Bld: 135 mg/dL — ABNORMAL HIGH (ref 70–99)
Potassium: 5.3 mmol/L — ABNORMAL HIGH (ref 3.5–5.1)
Sodium: 136 mmol/L (ref 135–145)

## 2019-12-26 LAB — PHOSPHORUS: Phosphorus: 11.8 mg/dL — ABNORMAL HIGH (ref 2.5–4.6)

## 2019-12-26 LAB — MAGNESIUM: Magnesium: 3.4 mg/dL — ABNORMAL HIGH (ref 1.7–2.4)

## 2019-12-26 MED ORDER — PRISMASOL BGK 4/2.5 32-4-2.5 MEQ/L REPLACEMENT SOLN: Status: DC

## 2019-12-26 MED ORDER — SODIUM CHLORIDE 0.9 % FOR CRRT: INTRAVENOUS_CENTRAL | Status: DC | PRN

## 2019-12-26 MED ORDER — ALTEPLASE 2 MG IJ SOLR: 2.0000 mg | Freq: Once | INTRAMUSCULAR | Status: DC | PRN

## 2019-12-26 MED ORDER — PRISMASOL BGK 4/2.5 32-4-2.5 MEQ/L IV SOLN: INTRAVENOUS | Status: DC

## 2019-12-26 MED ORDER — HEPARIN SODIUM (PORCINE) 1000 UNIT/ML DIALYSIS
1000.0000 [IU] | INTRAMUSCULAR | Status: DC | PRN
  Administered 2019-12-26: 3100 [IU] via INTRAVENOUS_CENTRAL

## 2019-12-26 MED ORDER — SODIUM CHLORIDE 0.9 % IV SOLN
2.0000 g | Freq: Two times a day (BID) | INTRAVENOUS | Status: DC
  Administered 2019-12-27 – 2019-12-29 (×5): 2 g via INTRAVENOUS
  Filled 2019-12-26 (×5): qty 2

## 2019-12-26 MED ORDER — STERILE WATER FOR INJECTION IV SOLN
INTRAVENOUS | Status: DC
  Filled 2019-12-26: qty 150
  Filled 2019-12-26: qty 850
  Filled 2019-12-26 (×2): qty 150
  Filled 2019-12-26 (×3): qty 850
  Filled 2019-12-26 (×2): qty 150
  Filled 2019-12-26 (×3): qty 850

## 2019-12-26 MED ORDER — NEPRO/CARBSTEADY PO LIQD
1000.0000 mL | ORAL | Status: DC
  Administered 2019-12-26 – 2019-12-28 (×3): 1000 mL
  Filled 2019-12-26 (×4): qty 1000

## 2019-12-26 MED ORDER — FREE WATER
200.0000 mL | Freq: Three times a day (TID) | Status: DC
  Administered 2019-12-26 – 2019-12-28 (×6): 200 mL

## 2019-12-26 NOTE — Progress Notes (Signed)
PHARMACY NOTE:  ANTIMICROBIAL RENAL DOSAGE ADJUSTMENT  Current antimicrobial regimen includes a mismatch between antimicrobial dosage and estimated renal function. As per policy approved by the Pharmacy & Therapeutics and Medical Executive Committees, the antimicrobial dosage will be adjusted accordingly.  Current antimicrobial and dosage:  Cefepime 2g IV q24 hr  Indication: PNA   Renal Function:   Estimated Creatinine Clearance: 15.2 mL/min (A) (by C-G formula based on SCr of 6.09 mg/dL (H)). []      On intermittent HD, scheduled: [x]      On CRRT    Antimicrobial dosage has been changed to:  2g IV q12 hr   Additional Comments: CRRT starting tonight. No other meds identified requiring CRRT adjustment. Pharmacy will continue to follow CRRT status and adjust accordingly   Thank you for allowing pharmacy to be a part of this patient's care.  Reuel Boom, PharmD, BCPS [phone] 12/26/2019, 4:42 PM

## 2019-12-26 NOTE — Procedures (Signed)
Central Venous Catheter Insertion Procedure Note  Arvin Abello  [id number]  May 11, 1950  Date:12/26/19  Time:4:02 PM   Provider Performing:Romelle Reiley Alfredo Martinez   Procedure: Insertion of Non-tunneled Central Venous Catheter(36556)with US guidance (73403)    Indication(s) Hemodialysis  Consent Risks of the procedure as well as the alternatives and risks of each were explained to the patient and/or caregiver.  Consent for the procedure was obtained and is signed in the bedside chart  Anesthesia Topical only with 1% lidocaine   Timeout Verified patient identification, verified procedure, site/side was marked, verified correct patient position, special equipment/implants available, medications/allergies/relevant history reviewed, required imaging and test results available.  Sterile Technique Maximal sterile technique including full sterile barrier drape, hand hygiene, sterile gown, sterile gloves, mask, hair covering, sterile ultrasound probe cover (if used).  Procedure Description Area of catheter insertion was cleaned with chlorhexidine and draped in sterile fashion.   With real-time ultrasound guidance a HD catheter was placed into the right internal jugular vein.  Nonpulsatile blood flow and easy flushing noted in all ports.  The catheter was sutured in place and sterile dressing applied.  Complications/Tolerance None; patient tolerated the procedure well. Chest X-ray is ordered to verify placement for internal jugular or subclavian cannulation.  Chest x-ray is not ordered for femoral cannulation.  EBL Minimal  Specimen(s) None   Procedure performed under direct supervision of Dr. Silas Flood and with ultrasound guidance for real time vessel cannulation.   Precepted JD Rollene Rotunda, PA-S.    Noe Gens, MSN, NP-C, AGACNP-BC [facility] 12/26/2019, 4:03 PM   Please see [email or web address] for pager details.

## 2019-12-26 NOTE — Progress Notes (Signed)
[facility] Progress Note  Subjective: UOP up a bit to 700 cc yesterday, however creat up to 6 today and BUN > 200.    Vitals:   12/26/19 0900 12/26/19 1000 12/26/19 1100 12/26/19 1209  BP:      Pulse: 89 87 85   Resp: (!) 35 (!) 35 (!) 35   Temp:    (!) 96.1 F (35.6 C)  TempSrc:    Axillary  SpO2: (!) 86% 97% 98%   Weight:      Height:        Exam: on vent ,sedated  no jvd  throat ett in place  Chest cta bilat and lat  Cor reg no RG  Abd soft ntnd no ascites   Ext diffuse 2+ UE and LE edema   Neuro on vent and sedated, no following commands    RUS 10/12 >  12 cm kidneys w/o hydro   UA 10/13 - 30 prot, rbc >50/ 20-50 wbc/ 0-5 epi/ many bact    10/12 -  UNa 46 / UCr 111  Summary: 69 yo w/ COVID-19 PNA admitted 9/20,  rx'd w/ IV steroids/ remdesivir and baricitinib. Intubated on 10/5.  Had some hypotension after intubation into 67's, was on pressors, and developed AKI.  RUS was wnl. Did get IV contrast on 9/29, neg for PE. Got IV vanc on 10/3 and 10/4. Creat was normal at 0.98 on 10/02. On 10/06 creat started to rise to 2.1 >> and peaked at 4.60 pm 10/13 then improved > 4.0 > 3.70 > 2.94 yest but back up to 3.47 today. Pt is nonoliguric.     Assessment/ Plan: 1. AKI - nonoliguric in pt w/ COVID-19 PNA. ATN most likely due to hypotension, acute critical illness. Creat peaked at 4.60 on 1013, then improved down to 2.9 on 10/17. Then BP's and UOP dropped and creat rising again now up to 5.0 today.  Resp acidosis, pH 7.10, K 5.3 today, Cr up 6.0. CVP's normal. Vol overload on exam. Creatinine continues to rise despite reasonable UOP. Will need CRRT if aggressive care desired. Will d/w CCM.   2. Hypotension - on levo and vaso gtt's 3. Volume overload - diffuse 2 + pitting edema 4. ARDS d/t COVID PNA - per CCM 5. Sepsis - restarted on IV abx (zyvox/ cefepime) 10/20 for suspected HCAP.  6. CAD hx stent     Rob Blanche Scovell 12/26/2019, 12:58 PM   Recent Labs  Lab  12/24/19 0520 12/25/19 0103 12/25/19 0326 12/26/19 0922  K   < >  --  4.8 5.3*  BUN   < >  --  208* 225*  CREATININE   < >  --  5.10* 6.09*  CALCIUM   < >  --  7.2* 7.9*  PHOS   < >  --  10.5* 11.8*  HGB  --  7.2*  --  7.6*   < > = values in this interval not displayed.   Inpatient medications: . chlorhexidine gluconate (MEDLINE KIT)  15 mL Mouth Rinse BID  . Chlorhexidine Gluconate Cloth  6 each Topical Daily  . clopidogrel  75 mg Per Tube Daily  . collagenase   Topical Daily  . darbepoetin (ARANESP) injection - NON-DIALYSIS  100 mcg Subcutaneous Q Sat-1800  . docusate  100 mg Per Tube BID  . feeding supplement (NEPRO CARB STEADY)  1,000 mL Per Tube Q24H  . feeding supplement (PROSource TF)  90 mL Per Tube BID  . free water  200 mL Per Tube Q8H  . heparin injection (subcutaneous)  5,000 Units Subcutaneous Q8H  . insulin aspart  0-9 Units Subcutaneous Q4H  . mouth rinse  15 mL Mouth Rinse 10 times per day  . pantoprazole sodium  40 mg Per Tube Q1200  . polyethylene glycol  17 g Per Tube Daily  . sevelamer carbonate  2.4 g Per Tube TID WC  . sodium chloride flush  10-40 mL Intracatheter Q12H   . sodium chloride Stopped (12/25/19 1418)  . ceFEPime (MAXIPIME) IV Stopped (12/25/19 1448)  . fentaNYL infusion INTRAVENOUS 400 mcg/hr (12/26/19 1000)  . linezolid (ZYVOX) IV Stopped (12/26/19 0315)  . norepinephrine (LEVOPHED) Adult infusion 8 mcg/min (12/26/19 1121)  .  sodium bicarbonate (isotonic) infusion in sterile water 125 mL/hr at 12/26/19 1007  . vasopressin Stopped (12/26/19 0626)   sodium chloride, acetaminophen (TYLENOL) oral liquid 160 mg/5 mL, albuterol, fentaNYL, hydrALAZINE, midazolam, oxymetazoline, sodium chloride flush

## 2019-12-26 NOTE — Plan of Care (Signed)
  Problem: Respiratory: Goal: Will maintain a patent airway Outcome: Progressing   Problem: Clinical Measurements: Goal: Diagnostic test results will improve Outcome: Progressing   Problem: Coping: Goal: Level of anxiety will decrease Outcome: Progressing   Problem: Safety: Goal: Ability to remain free from injury will improve Outcome: Progressing

## 2019-12-26 NOTE — Progress Notes (Signed)
NUTRITION NOTE  Full follow-up completed on 10/18. Patient discussed in rounds this AM. Plan for trickle rate TF. Changed order to reflect this.  Order now in place for Nepro @ 20 ml/hr with 90 ml Prosource TF BID. This regimen provides 1024 kcal, 83 grams protein, and 349 ml free water.  CCM plans to make changes to free water flush d/t moderate pitting edema to BLE and deep pitting edema to BUE.    Estimated Nutritional Needs:  Kcal:  2640 kcal (30 kcal/kg) Protein:  150-176 grams (1.7-2 grams/kg) Fluid:  >/= 3 L/day     Jarome Matin, MS, RD, LDN, CNSC Inpatient Clinical Dietitian RD pager # available in [place]  After hours/weekend pager # available in [facility]

## 2019-12-27 DIAGNOSIS — J80 Acute respiratory distress syndrome: Secondary | ICD-10-CM | POA: Diagnosis not present

## 2019-12-27 DIAGNOSIS — U071 COVID-19: Secondary | ICD-10-CM | POA: Diagnosis not present

## 2019-12-27 DIAGNOSIS — N179 Acute kidney failure, unspecified: Secondary | ICD-10-CM | POA: Diagnosis not present

## 2019-12-27 DIAGNOSIS — J9601 Acute respiratory failure with hypoxia: Secondary | ICD-10-CM | POA: Diagnosis not present

## 2019-12-27 LAB — CBC
HCT: 24.3 % — ABNORMAL LOW (ref 39.0–52.0)
Hemoglobin: 7.6 g/dL — ABNORMAL LOW (ref 13.0–17.0)
MCH: 30.5 pg (ref 26.0–34.0)
MCHC: 31.3 g/dL (ref 30.0–36.0)
MCV: 97.6 fL (ref 80.0–100.0)
Platelets: 393 10*3/uL (ref 150–400)
RBC: 2.49 MIL/uL — ABNORMAL LOW (ref 4.22–5.81)
RDW: 18 % — ABNORMAL HIGH (ref 11.5–15.5)
WBC: 18.2 10*3/uL — ABNORMAL HIGH (ref 4.0–10.5)
nRBC: 0.9 % — ABNORMAL HIGH (ref 0.0–0.2)

## 2019-12-27 LAB — RENAL FUNCTION PANEL
Albumin: 2.1 g/dL — ABNORMAL LOW (ref 3.5–5.0)
Anion gap: 13 (ref 5–15)
BUN: 152 mg/dL — ABNORMAL HIGH (ref 8–23)
CO2: 27 mmol/L (ref 22–32)
Calcium: 7.5 mg/dL — ABNORMAL LOW (ref 8.9–10.3)
Chloride: 94 mmol/L — ABNORMAL LOW (ref 98–111)
Creatinine, Ser: 4.12 mg/dL — ABNORMAL HIGH (ref 0.61–1.24)
GFR, Estimated: 15 mL/min — ABNORMAL LOW (ref >60)
Glucose, Bld: 155 mg/dL — ABNORMAL HIGH (ref 70–99)
Phosphorus: 7.5 mg/dL — ABNORMAL HIGH (ref 2.5–4.6)
Potassium: 4.5 mmol/L (ref 3.5–5.1)
Sodium: 134 mmol/L — ABNORMAL LOW (ref 135–145)

## 2019-12-27 LAB — MAGNESIUM: Magnesium: 2.9 mg/dL — ABNORMAL HIGH (ref 1.7–2.4)

## 2019-12-27 LAB — GLUCOSE, CAPILLARY
Glucose-Capillary: 105 mg/dL — ABNORMAL HIGH (ref 70–99)
Glucose-Capillary: 125 mg/dL — ABNORMAL HIGH (ref 70–99)
Glucose-Capillary: 129 mg/dL — ABNORMAL HIGH (ref 70–99)
Glucose-Capillary: 132 mg/dL — ABNORMAL HIGH (ref 70–99)
Glucose-Capillary: 138 mg/dL — ABNORMAL HIGH (ref 70–99)
Glucose-Capillary: 146 mg/dL — ABNORMAL HIGH (ref 70–99)

## 2019-12-27 LAB — APTT: aPTT: 34 seconds (ref 24–36)

## 2019-12-27 LAB — D-DIMER, QUANTITATIVE: D-Dimer, Quant: 3.79 ug/mL-FEU — ABNORMAL HIGH (ref 0.00–0.50)

## 2019-12-27 MED ORDER — ONDANSETRON HCL 4 MG/2ML IJ SOLN
4.0000 mg | Freq: Four times a day (QID) | INTRAMUSCULAR | Status: DC | PRN
  Administered 2019-12-27 – 2019-12-28 (×2): 4 mg via INTRAVENOUS
  Filled 2019-12-27 (×2): qty 2

## 2019-12-27 MED ORDER — METOCLOPRAMIDE HCL 5 MG/ML IJ SOLN
5.0000 mg | Freq: Four times a day (QID) | INTRAMUSCULAR | Status: DC
  Administered 2019-12-27 – 2019-12-30 (×11): 5 mg via INTRAVENOUS
  Filled 2019-12-27 (×12): qty 2

## 2019-12-27 MED ORDER — NOREPINEPHRINE 16 MG/250ML-% IV SOLN
0.0000 ug/min | INTRAVENOUS | Status: DC
  Administered 2019-12-27: 8 ug/min via INTRAVENOUS
  Administered 2019-12-30: 6 ug/min via INTRAVENOUS
  Filled 2019-12-27 (×2): qty 250

## 2019-12-27 NOTE — Progress Notes (Signed)
[facility] Progress Note  Subjective: UOP 500 cc yest.  Levo gtt at 11 ug/min.  I/O + 5 L yesterday.   Vitals:   12/27/19 1254 12/27/19 1255 12/27/19 1256 12/27/19 1257  BP:      Pulse: 87 87 87 87  Resp: (!) 35 (!) 35 (!) 35 (!) 35  Temp:      TempSrc:      SpO2: 94% 94% 94% 94%  Weight:      Height:        Exam: on vent ,sedated  no jvd  throat ett in place  Chest cta bilat and lat  Cor reg no RG  Abd soft ntnd no ascites   Ext diffuse 2+ UE and LE edema   Neuro on vent and sedated, no following commands    RUS 10/12 >  12 cm kidneys w/o hydro   UA 10/13 - 30 prot, rbc >50/ 20-50 wbc/ 0-5 epi/ many bact    10/12 -  UNa 46 / UCr 111  Summary: 69 yo w/ COVID-19 PNA admitted 9/20,  rx'd w/ IV steroids/ remdesivir and baricitinib. Intubated on 10/5.  Had some hypotension after intubation into 70's, was on pressors, and developed AKI.  RUS was wnl. Did get IV contrast on 9/29, neg for PE. Got IV vanc on 10/3 and 10/4. Creat was normal at 0.98 on 10/02. On 10/06 creat started to rise to and peaked at 4.60 pm 10/13 then improved > 4.0 > 3.70 > 2.9 but then declined again.    Assessment/ Plan: 1. AKI - nonoliguric in pt w/ COVID-19 PNA. ATN most likely due to hypotension, acute critical illness. Creat peaked at 4.60 on 10/13, then improved down to 2.9 on 10/17. Then BP's and UOP dropped and creat rose again up to 6.0 > CRRT started on 10/22.  CVP's normal. Vol overload on exam. On levo gtt only. Cont CRRT.  2. Hypotension - vaso off, on levo at 11 ml/min 3. Volume overload - diffuse 2 + pitting edema, on 1 pressor now, will try to pull some volume 4. ARDS d/t COVID PNA - per CCM 5. Sepsis - restarted on IV abx (zyvox/ cefepime) 10/20 for suspected HCAP.  6. CAD hx stent     Rob Tasnim Balentine 12/27/2019, 1:05 PM   Recent Labs  Lab 12/26/19 0922 12/27/19 0315 12/27/19 0330  K 5.3*  --  4.5  BUN 225*  --  152*  CREATININE 6.09*  --  4.12*  CALCIUM 7.9*  --   7.5*  PHOS 11.8*  --  7.5*  HGB 7.6* 7.6*  --    Inpatient medications:  chlorhexidine gluconate (MEDLINE KIT)  15 mL Mouth Rinse BID   Chlorhexidine Gluconate Cloth  6 each Topical Daily   clopidogrel  75 mg Per Tube Daily   collagenase   Topical Daily   darbepoetin (ARANESP) injection - NON-DIALYSIS  100 mcg Subcutaneous Q Sat-1800   docusate  100 mg Per Tube BID   feeding supplement (NEPRO CARB STEADY)  1,000 mL Per Tube Q24H   feeding supplement (PROSource TF)  90 mL Per Tube BID   free water  200 mL Per Tube Q8H   heparin injection (subcutaneous)  5,000 Units Subcutaneous Q8H   insulin aspart  0-9 Units Subcutaneous Q4H   mouth rinse  15 mL Mouth Rinse 10 times per day   pantoprazole sodium  40 mg Per Tube Q1200   polyethylene glycol  17 g Per Tube Daily  sevelamer carbonate  2.4 g Per Tube TID WC   sodium chloride flush  10-40 mL Intracatheter Q12H     prismasol BGK 4/2.5 400 mL/hr at 12/27/19 0555    prismasol BGK 4/2.5 200 mL/hr at 12/26/19 1717   sodium chloride Stopped (12/25/19 1418)   ceFEPime (MAXIPIME) IV Stopped (12/27/19 0545)   fentaNYL infusion INTRAVENOUS 400 mcg/hr (12/27/19 1300)   linezolid (ZYVOX) IV Stopped (12/27/19 0221)   norepinephrine (LEVOPHED) Adult infusion 11 mcg/min (12/27/19 1304)   prismasol BGK 4/2.5 2,000 mL/hr at 12/27/19 1119    sodium bicarbonate (isotonic) infusion in sterile water 125 mL/hr at 12/27/19 1300   sodium chloride, acetaminophen (TYLENOL) oral liquid 160 mg/5 mL, albuterol, alteplase, fentaNYL, heparin, hydrALAZINE, midazolam, ondansetron (ZOFRAN) IV, oxymetazoline, sodium chloride, sodium chloride flush

## 2019-12-27 NOTE — Progress Notes (Signed)
NAME:  Ronald Olsen, MRN:  [id number], DOB:  1950-03-29, LOS: 55 ADMISSION DATE:  11/23/2019, CONSULTATION DATE:  10/4 REFERRING MD:  Doreatha Martin, CHIEF COMPLAINT:  Respiratory failure in setting of COVID-19   Brief History   69 year old male patient admitted on 9/20 with Covid pneumonia. Treated with supplemental oxygen, systemic steroids, remdesivir and baricitinib. Progressive resp failure, intubated 10/5. Evolving ARF. Treated empirically for possible HCAP.   Past Medical History  Hypertension, dyslipidemia. Coronary artery disease  Significant Hospital Events   9/20  Admit, COVID positive, PCT <0.10, BNP neg. High dose steroids, Remdesivir and baricitinib.  9/21 HFNC 15 liters    9/22 CRP down 3.4 but still on high FIO2 9/25 Completed day 5 of Remdesivir. Still on high dose steroids and Baricitinib  9/26 Increased oxygen demand. Placed on NRB and high flow.  9/28 Rapid desaturation w/ any movement  9/29 CT angio negative for PE  10/4 Worsening resp failure, rapid response called. PCCM asked to see w/ tranfers to ICU 10/5 Intubated. Replaced w larger 8.0 ETT 10/5 for intermittent cuffleak. Epistaxis  10/6  Prone positioning -FiO2 0.60, PEEP 10 ,Continued oozing from nare, even after packing.  Heparin held 10/8 FiO2 increased to 60% due to desaturation 10/9 Nasal packing removed 10/11 Ongoing oozing from right nares, draining into mouth, vent dyssynchrony.  Heparin SQ held. P/F 87, prone 10/13 Nose repacked after review with ENT 10/14 Weaned to fentanyl gtt only, followed commands  10/17 weaned again to fentanyl (versed resumed over weekend) 10/18 attempted diuresis, still net positive, Cr slightly improved 10/19 went down on fent gtt overnight, dyssynchronous FiO2 increased, weaning FiO2 back down with pO2 126 10/20 Worsening renal function, stable pCO2 but worsening metabolic acidosis 44/01 started CVVH   Consults:  Pulmonary consulted 10/4 ENT 10/9, 10/13  Procedures:    ETT 10/5 >>  LUE PICC 10/5 >>  R Femoral ALine 10/6 >> 10/12 R Radial Aline 10/14 >> R IJ HD catheter 10/22  Significant Diagnostic Tests:  9/29 CT Chest 9/29 >> multifocal groundglass attenuations bilaterally through all lobes 10/5 ECHO >> Intact LV function 02-72%, grade 1 diastolic dysfunction, normal RV function and size 10/7 LE Venous duplex >> negative 10/12 Renal US >> unremarkable kidneys, decompressed bladder but diffuse wall thickening  Micro Data:  COVID 9/29 >> Positive BCx2 9/20 >> Negative MRSA  PCR  10/4 > neg  ET 10/20 >  Neg  BC x 2  10/20 >>>  Antimicrobials:  Remdesivir 9/20 >> 9/25 Vanc 10/3 >> 10/6 Ceftriaxone 10/3 >> 10/4 Cefepime 10/4 >> 10/9 Azitho 10/3 >> 10/6 Unasyn 10/13>>10/20 (ppx for nasal packing) Cefepime 10/20 >>>  linezolid 10/20  > 10/23   Scheduled Meds: . chlorhexidine gluconate (MEDLINE KIT)  15 mL Mouth Rinse BID  . Chlorhexidine Gluconate Cloth  6 each Topical Daily  . clopidogrel  75 mg Per Tube Daily  . collagenase   Topical Daily  . darbepoetin (ARANESP) injection - NON-DIALYSIS  100 mcg Subcutaneous Q Sat-1800  . docusate  100 mg Per Tube BID  . feeding supplement (NEPRO CARB STEADY)  1,000 mL Per Tube Q24H  . feeding supplement (PROSource TF)  90 mL Per Tube BID  . free water  200 mL Per Tube Q8H  . heparin injection (subcutaneous)  5,000 Units Subcutaneous Q8H  . insulin aspart  0-9 Units Subcutaneous Q4H  . mouth rinse  15 mL Mouth Rinse 10 times per day  . metoCLOPramide (REGLAN) injection  5 mg Intravenous Q6H  .  pantoprazole sodium  40 mg Per Tube Q1200  . polyethylene glycol  17 g Per Tube Daily  . sevelamer carbonate  2.4 g Per Tube TID WC  . sodium chloride flush  10-40 mL Intracatheter Q12H   Continuous Infusions: .  prismasol BGK 4/2.5 400 mL/hr at 12/27/19 0555  .  prismasol BGK 4/2.5 200 mL/hr at 12/26/19 1717  . sodium chloride Stopped (12/25/19 1418)  . ceFEPime (MAXIPIME) IV Stopped (12/27/19 0545)  .  fentaNYL infusion INTRAVENOUS 400 mcg/hr (12/27/19 1400)  . norepinephrine (LEVOPHED) Adult infusion 16 mcg/min (12/27/19 1400)  . prismasol BGK 4/2.5 2,000 mL/hr at 12/27/19 1340  .  sodium bicarbonate (isotonic) infusion in sterile water 125 mL/hr at 12/27/19 1400   PRN Meds:.sodium chloride, acetaminophen (TYLENOL) oral liquid 160 mg/5 mL, albuterol, alteplase, fentaNYL, heparin, hydrALAZINE, midazolam, ondansetron (ZOFRAN) IV, oxymetazoline, sodium chloride, sodium chloride flush  Interim history/subjective:  Still pressor dep/ still not responding to verbal   Objective   Blood pressure 134/63, pulse 82, temperature (!) 96.4 F (35.8 C), temperature source Axillary, resp. rate (!) 35, height _0  (1.803 m), weight 113.7 kg, SpO2 95 %. CVP:  [8 mmHg-15 mmHg] 11 mmHg  Vent Mode: PRVC FiO2 (%):  [50 %] 50 % Set Rate:  [35 bmp] 35 bmp Vt Set:  [450 mL] 450 mL PEEP:  [10 cmH20] 10 cmH20 Plateau Pressure:  [34 cmH20-38 cmH20] 34 cmH20   Intake/Output Summary (Last 24 hours) at 12/27/2019 1450 Last data filed at 12/27/2019 1400 Gross per 24 hour  Intake 6842.59 ml  Output 5869 ml  Net 973.59 ml   Filed Weights   12/25/19 0530 12/26/19 0215 12/27/19 0438  Weight: 110.5 kg 115.1 kg 113.7 kg    Examination: Tmax 98.2 Pt chronically ill, no purposeful resp Oropharynx et Neck supple Lungs with a few scattered exp > insp rhonchi bilaterally RRR no s3 or or sign murmur Abd soft / nl excursion  Extr warm with trace edema       I personally reviewed images and agree with radiology impression as follows:  CXR:   Portable 10/21  Right jugular central venous catheter tip at the cavoatrial junction. No pneumothorax Endotracheal tube in good position Diffuse bilateral airspace disease unchanged.   Assessment & Plan:   ARDS secondary to COVID PNA: Overall no improvement. Worsened dead space over time. FiO2 and PEEP going up. Combo of edema, evolving lung injury, possible  pneumonia. -Completed remdesivir, baricitinib, steroids.  ETT with cuff leak 10/5, changed.  -Off precautions 10/18 -Continue low tidal volume ventilation -Follow intermittent chest x-ray -Wean FiO2 and PEEP as tolerated  Acute Metabolic Encephalopathy  Sedation Needs while on Vent   - still on fent drip breathing just above set vent rate     Shock HCAP: Thick secretions new pressor requirement 10/20. LRCX with gram positives In the setting of sedating medications + presumed septic shock.   -MAP > 65, pressors as needed -  MRSA neg/ sputum neg for mrsa > d/c'd linezolid per flowsheet    Rising D-dimer:  -Repeat LE Dopplers given increase in O2 negative 10/20  CAD  Hx stent on plavix -continue plavix  -hold lipitor with elevated CK   AKI, Oliguric  Negative renal US, CK 621.  Likely ATN from hypotension and hypoxemia and critical illness.  Cr rising. Appears overloaded on exam but did not put out to high dose lasix. CVP 10. Not hypovolemic.  -No lasix -Discuss possibility of CVVHD with wife, significant implications  with likely eventual need for trach if he recovers, poor prognostic sign - tentative plan for time limited trial of dialysis if needed   Epistaxis (resolved) Suspect traumatic from NG tube attempt. LE duplex negative, ECHO negative.   Enoxaparin, aspirin, Plavix held. heparin gtt stopped on 10/6.  Re-packed 10/13, on DVT ppx. Nasal packing removed 10/20.  Anemia    Lab Results  Component Value Date   HGB 7.6 (L) 12/27/2019   HGB 7.6 (L) 12/26/2019   HGB 7.2 (L) 12/25/2019   due to phlebotomy, critical illness, chronic disease/inflammation - threshold for tx = hgb < 7   Hyperglycemia Mild, suspect steroid-induced -SSI   Best practice:  Diet: NPO, TF  Pain/Anxiety/Delirium protocol (if indicated): PAD protocol  VAP protocol (if indicated): Ordered DVT prophylaxis: SCD's, heparin restarted 10/12, see above  GI prophylaxis: PPI Glucose control: SSI      Mobility: Bedrest Code Status: DNR Family Communication  No fm at bedsie  Disposition: ICU      The patient is critically ill with multiple organ systems failure and requires high complexity decision making for assessment and support, frequent evaluation and titration of therapies, application of advanced monitoring technologies and extensive interpretation of multiple databases. Critical Care Time devoted to patient care services described in this note is 35 minutes.   Christinia Gully, MD Pulmonary and [place] [phone]   After 7:00 pm call Elink  [phone]

## 2019-12-28 DIAGNOSIS — J9601 Acute respiratory failure with hypoxia: Secondary | ICD-10-CM | POA: Diagnosis not present

## 2019-12-28 DIAGNOSIS — U071 COVID-19: Secondary | ICD-10-CM | POA: Diagnosis not present

## 2019-12-28 DIAGNOSIS — N179 Acute kidney failure, unspecified: Secondary | ICD-10-CM | POA: Diagnosis not present

## 2019-12-28 LAB — CBC
HCT: 22.6 % — ABNORMAL LOW (ref 39.0–52.0)
Hemoglobin: 7 g/dL — ABNORMAL LOW (ref 13.0–17.0)
MCH: 31 pg (ref 26.0–34.0)
MCHC: 31 g/dL (ref 30.0–36.0)
MCV: 100 fL (ref 80.0–100.0)
Platelets: 334 10*3/uL (ref 150–400)
RBC: 2.26 MIL/uL — ABNORMAL LOW (ref 4.22–5.81)
RDW: 18.1 % — ABNORMAL HIGH (ref 11.5–15.5)
WBC: 17.3 10*3/uL — ABNORMAL HIGH (ref 4.0–10.5)
nRBC: 1.6 % — ABNORMAL HIGH (ref 0.0–0.2)

## 2019-12-28 LAB — RENAL FUNCTION PANEL
Albumin: 2 g/dL — ABNORMAL LOW (ref 3.5–5.0)
Albumin: 1.9 g/dL — ABNORMAL LOW (ref 3.5–5.0)
Albumin: 1.9 g/dL — ABNORMAL LOW (ref 3.5–5.0)
Anion gap: 11 (ref 5–15)
Anion gap: 6 (ref 5–15)
Anion gap: 10 (ref 5–15)
BUN: 67 mg/dL — ABNORMAL HIGH (ref 8–23)
BUN: 71 mg/dL — ABNORMAL HIGH (ref 8–23)
BUN: 61 mg/dL — ABNORMAL HIGH (ref 8–23)
CO2: 28 mmol/L (ref 22–32)
CO2: 28 mmol/L (ref 22–32)
CO2: 29 mmol/L (ref 22–32)
Calcium: 7.3 mg/dL — ABNORMAL LOW (ref 8.9–10.3)
Calcium: 6.8 mg/dL — ABNORMAL LOW (ref 8.9–10.3)
Calcium: 7.6 mg/dL — ABNORMAL LOW (ref 8.9–10.3)
Chloride: 95 mmol/L — ABNORMAL LOW (ref 98–111)
Chloride: 99 mmol/L (ref 98–111)
Chloride: 97 mmol/L — ABNORMAL LOW (ref 98–111)
Creatinine, Ser: 2.69 mg/dL — ABNORMAL HIGH (ref 0.61–1.24)
Creatinine, Ser: 2.23 mg/dL — ABNORMAL HIGH (ref 0.61–1.24)
Creatinine, Ser: 1.96 mg/dL — ABNORMAL HIGH (ref 0.61–1.24)
GFR, Estimated: 25 mL/min — ABNORMAL LOW (ref >60)
GFR, Estimated: 32 mL/min — ABNORMAL LOW (ref >60)
GFR, Estimated: 37 mL/min — ABNORMAL LOW (ref >60)
Glucose, Bld: 146 mg/dL — ABNORMAL HIGH (ref 70–99)
Glucose, Bld: 96 mg/dL (ref 70–99)
Glucose, Bld: 97 mg/dL (ref 70–99)
Phosphorus: 5.7 mg/dL — ABNORMAL HIGH (ref 2.5–4.6)
Phosphorus: 4.4 mg/dL (ref 2.5–4.6)
Phosphorus: 3.9 mg/dL (ref 2.5–4.6)
Potassium: 4.5 mmol/L (ref 3.5–5.1)
Potassium: 4.1 mmol/L (ref 3.5–5.1)
Potassium: 4.4 mmol/L (ref 3.5–5.1)
Sodium: 134 mmol/L — ABNORMAL LOW (ref 135–145)
Sodium: 133 mmol/L — ABNORMAL LOW (ref 135–145)
Sodium: 136 mmol/L (ref 135–145)

## 2019-12-28 LAB — MAGNESIUM: Magnesium: 2.5 mg/dL — ABNORMAL HIGH (ref 1.7–2.4)

## 2019-12-28 LAB — D-DIMER, QUANTITATIVE: D-Dimer, Quant: 4.6 ug/mL-FEU — ABNORMAL HIGH (ref 0.00–0.50)

## 2019-12-28 LAB — GLUCOSE, CAPILLARY
Glucose-Capillary: 102 mg/dL — ABNORMAL HIGH (ref 70–99)
Glucose-Capillary: 91 mg/dL (ref 70–99)
Glucose-Capillary: 100 mg/dL — ABNORMAL HIGH (ref 70–99)
Glucose-Capillary: 87 mg/dL (ref 70–99)
Glucose-Capillary: 83 mg/dL (ref 70–99)
Glucose-Capillary: 91 mg/dL (ref 70–99)

## 2019-12-28 LAB — APTT: aPTT: 37 seconds — ABNORMAL HIGH (ref 24–36)

## 2019-12-28 MED ORDER — CLONAZEPAM 1 MG PO TABS
1.0000 mg | ORAL_TABLET | Freq: Two times a day (BID) | ORAL | Status: DC
  Administered 2019-12-28 – 2019-12-30 (×4): 1 mg
  Filled 2019-12-28 (×5): qty 1

## 2019-12-28 NOTE — Progress Notes (Addendum)
NAME:  Ronald Olsen, MRN:  [id number], DOB:  1951/01/13, LOS: 20 ADMISSION DATE:  11/06/2019, CONSULTATION DATE:  10/4 REFERRING MD:  Doreatha Martin, CHIEF COMPLAINT:  Respiratory failure in setting of COVID-19   Brief History   69 year old male patient admitted on 9/20 with Covid pneumonia. Treated with supplemental oxygen, systemic steroids, remdesivir and baricitinib. Progressive resp failure, intubated 10/5. Evolving ARF. Treated empirically for possible HCAP.   Past Medical History  Hypertension, dyslipidemia. Coronary artery disease  Significant Hospital Events   9/20  Admit, COVID positive, PCT <0.10, BNP neg. High dose steroids, Remdesivir and baricitinib.  9/21 HFNC 15 liters    9/22 CRP down 3.4 but still on high FIO2 9/25 Completed day 5 of Remdesivir. Still on high dose steroids and Baricitinib  9/26 Increased oxygen demand. Placed on NRB and high flow.  9/28 Rapid desaturation w/ any movement  9/29 CT angio negative for PE  10/4 Worsening resp failure, rapid response called. PCCM asked to see w/ tranfers to ICU 10/5 Intubated. Replaced w larger 8.0 ETT 10/5 for intermittent cuffleak. Epistaxis  10/6  Prone positioning -FiO2 0.60, PEEP 10 ,Continued oozing from nare, even after packing.  Heparin held 10/8 FiO2 increased to 60% due to desaturation 10/9 Nasal packing removed 10/11 Ongoing oozing from right nares, draining into mouth, vent dyssynchrony.  Heparin SQ held. P/F 87, prone 10/13 Nose repacked after review with ENT 10/14 Weaned to fentanyl gtt only, followed commands  10/17 weaned again to fentanyl (versed resumed over weekend) 10/18 attempted diuresis, still net positive, Cr slightly improved 10/19 went down on fent gtt overnight, dyssynchronous FiO2 increased, weaning FiO2 back down with pO2 126 10/20 Worsening renal function, stable pCO2 but worsening metabolic acidosis 44/96 started CVVH 10/23 added reglan 5 mg IV q 6 h for Nausea on Tube feeding  10/24 added  clonazepam to reduce air hunger as requiring higher freq dosing with versed prn and is in this for the long run    Consults:  Pulmonary consulted 10/4 ENT 10/9, 10/13  Procedures:  ETT 10/5 >>  LUE PICC 10/5 >>  R Femoral ALine 10/6 >> 10/12 R Radial Aline 10/14 >> R IJ HD catheter 10/22 >>>  Significant Diagnostic Tests:  9/29 CT Chest 9/29 >> multifocal groundglass attenuations bilaterally through all lobes 10/5 ECHO >> Intact LV function 75-91%, grade 1 diastolic dysfunction, normal RV function and size 10/7 LE Venous duplex >> negative 10/12 Renal US >> unremarkable kidneys, decompressed bladder but diffuse wall thickening  Micro Data:  COVID 9/29 >> Positive BCx2 9/20 >> Negative MRSA  PCR  10/4 > neg  ET 10/20 >  Neg     Antimicrobials:  Remdesivir 9/20 >> 9/25 Vanc 10/3 >> 10/6 Ceftriaxone 10/3 >> 10/4 Cefepime 10/4 >> 10/9 Azitho 10/3 >> 10/6 Unasyn 10/13>>10/20 (ppx for nasal packing)  linezolid 10/20  > 10/23  Cefepime 10/20 >>>   Scheduled Meds: . chlorhexidine gluconate (MEDLINE KIT)  15 mL Mouth Rinse BID  . Chlorhexidine Gluconate Cloth  6 each Topical Daily  . clonazePAM  1 mg Per Tube BID  . clopidogrel  75 mg Per Tube Daily  . collagenase   Topical Daily  . darbepoetin (ARANESP) injection - NON-DIALYSIS  100 mcg Subcutaneous Q Sat-1800  . docusate  100 mg Per Tube BID  . feeding supplement (NEPRO CARB STEADY)  1,000 mL Per Tube Q24H  . feeding supplement (PROSource TF)  90 mL Per Tube BID  . free water  200 mL  Per Tube Q8H  . heparin injection (subcutaneous)  5,000 Units Subcutaneous Q8H  . insulin aspart  0-9 Units Subcutaneous Q4H  . mouth rinse  15 mL Mouth Rinse 10 times per day  . metoCLOPramide (REGLAN) injection  5 mg Intravenous Q6H  . pantoprazole sodium  40 mg Per Tube Q1200  . polyethylene glycol  17 g Per Tube Daily  . sevelamer carbonate  2.4 g Per Tube TID WC  . sodium chloride flush  10-40 mL Intracatheter Q12H   Continuous  Infusions: .  prismasol BGK 4/2.5 400 mL/hr at 12/28/19 0715  .  prismasol BGK 4/2.5 200 mL/hr at 12/27/19 1827  . sodium chloride Stopped (12/25/19 1418)  . ceFEPime (MAXIPIME) IV Stopped (12/28/19 0541)  . fentaNYL infusion INTRAVENOUS 400 mcg/hr (12/28/19 1600)  . norepinephrine (LEVOPHED) Adult infusion Stopped (12/28/19 0811)  . prismasol BGK 4/2.5 2,000 mL/hr at 12/28/19 1526  .  sodium bicarbonate (isotonic) infusion in sterile water 125 mL/hr at 12/28/19 1600   PRN Meds:.sodium chloride, acetaminophen (TYLENOL) oral liquid 160 mg/5 mL, albuterol, alteplase, fentaNYL, heparin, hydrALAZINE, midazolam, ondansetron (ZOFRAN) IV, oxymetazoline, sodium chloride, sodium chloride flush    Interim history/subjective:  Requiring more freq versed boluses on top of fent drip for air hunger  Objective   Blood pressure (!) 132/45, pulse 98, temperature 97.7 F (36.5 C), temperature source Axillary, resp. rate 19, height _0  (1.803 m), weight 113.7 kg, SpO2 94 %. CVP:  [6 mmHg-10 mmHg] 8 mmHg  Vent Mode: PRVC FiO2 (%):  [50 %-70 %] 70 % Set Rate:  [35 bmp] 35 bmp Vt Set:  [450 mL] 450 mL PEEP:  [10 cmH20] 10 cmH20 Plateau Pressure:  [10 cmH20-37 cmH20] 26 cmH20   Intake/Output Summary (Last 24 hours) at 12/28/2019 1620 Last data filed at 12/28/2019 1600 Gross per 24 hour  Intake 5458.96 ml  Output 6662 ml  Net -1203.04 ml   Filed Weights   12/26/19 0215 12/27/19 0438 12/28/19 0426  Weight: 115.1 kg 113.7 kg 113.7 kg    CVP:  [6 mmHg-10 mmHg] 8 mmHg    Examination: Tmax  98.9 Pt sedated, air hungry desp fent drip and versed bolus Oropharynx et  Neck supple Lungs with a few scattered exp > insp rhonchi bilaterally RRR no s3 or or sign murmur Abd obese with limied excursion  Extr warm with no edema or clubbing noted              Assessment & Plan:   ARDS secondary to COVID PNA: Overall no improvement. Worsened dead space over time.Combo of edema, evolving lung  injury, possible pneumonia. -Completed remdesivir, baricitinib, steroids.  ETT with cuff leak 10/5, changed.  -Off precautions 10/18 -Follow intermittent chest x-ray -Wean FiO2 and PEEP as tolerated - consider PCV to help solve air hunger though note Vt will likely well exceed ards targets and need to be monitored closely  (vs sedate more heavily)   Acute Metabolic Encephalopathy  Sedation Needs while on Vent   - still on fent drip breathing well above set vent rate  - - try adding clonazepam pm 10/24      Shock HCAP: Thick secretions new pressor requirement 10/20. LRCX with gram positives In the setting of sedating medications + presumed septic shock.   -MAP > 65, pressors as needed - MRSA  PCR neg/ sputum neg for mrsa > d/c'd linezolid per flowsheet    Rising D-dimer:  -Repeat LE Dopplers  > Negative 10/20  CAD  Hx  stent on plavix -continue plavix  -hold lipitor with elevated CK   AKI, Oliguric  Negative renal US, CK 621.  Likely ATN from hypotension and hypoxemia and critical illness.  Cr rising. Appears overloaded on exam but did not put out to high dose lasix.   -No lasix -started CVVH10/22    Epistaxis (resolved) Suspect traumatic from NG tube attempt. LE duplex negative, ECHO negative.   Enoxaparin, aspirin, Plavix held. heparin gtt stopped on 10/6.  Re-packed 10/13, on DVT ppx. Nasal packing removed 10/20. - tol sq heparin as of 10/22   Anemia    Lab Results  Component Value Date   HGB 7.0 (L) 12/28/2019   HGB 7.6 (L) 12/27/2019   HGB 7.6 (L) 12/26/2019   due to phlebotomy, critical illness, chronic disease/inflammation - threshold for tx = hgb < 7   Hyperglycemia Mild, suspect steroid-induced -SSI   Best practice:  Diet: TF  Pain/Anxiety/Delirium protocol (if indicated): PAD protocol  VAP protocol (if indicated): Ordered DVT prophylaxis: SCD's, heparin restarted 10/12, see above  GI prophylaxis: PPI Glucose control: SSI    Mobility: Bedrest Code  Status: DNR Family Communication  Wife is retired RT discussed at bedside 10/24 and suspect she will req trach/ LTAC Disposition: ICU      Christinia Gully, MD Pulmonary and [place] [phone]   After 7:00 pm call Elink  [phone]

## 2019-12-28 NOTE — Progress Notes (Addendum)
[facility] Progress Note  Subjective: I/O even yest, wts stable, FiO2 up 0.60. +low dose levo dc'd at 8 am today.   Vitals:   12/28/19 0630 12/28/19 0645 12/28/19 0700 12/28/19 0800  BP:      Pulse: 96 94 96 (!) 102  Resp: (!) 23 20 (!) 22 20  Temp:   98.3 F (36.8 C)   TempSrc:   Axillary   SpO2: (!) 88% (!) 88% (!) 89% (!) 89%  Weight:      Height:        Exam: on vent ,sedated  no jvd  throat ett in place  Chest cta bilat and lat  Cor reg no RG  Abd soft ntnd no ascites   Ext diffuse 2+ UE and LE edema   Neuro on vent and sedated, no following commands    RUS 10/12 >  12 cm kidneys w/o hydro   UA 10/13 - 30 prot, rbc >50/ 20-50 wbc/ 0-5 epi/ many bact    10/12 -  UNa 46 / UCr 111    CXR 10/22 -  IMPRESSION: Diffuse bilateral airspace disease unchanged.  Summary: 69 yo w/ COVID-19 PNA admitted 9/20,  rx'd w/ IV steroids/ remdesivir and baricitinib. Intubated on 10/5.  Had some hypotension after intubation into 70's, was on pressors, and developed AKI.  RUS was wnl. Did get IV contrast on 9/29, neg for PE. Got IV vanc on 10/3 and 10/4. Creat was normal at 0.98 on 10/02. On 10/06 creat started to rise to and peaked at 4.60 pm 10/13 then improved > 4.0 > 3.70 > 2.9 but then declined again.    Assessment/ Plan: 1. AKI - nonoliguric in pt w/ COVID-19 PNA. ATN most likely due to shock/ acute critical illness. Creat peaked at 4.60 on 10/13 then improved down to 2.9 on 10/17. Then BP's dropped and creat > 6 so CRRT started on 10/22.  CVP's normal. Vol overload on exam. Weaned of levo early this am. Cont CRRT. Have dc'd bicarb gtt and free H2O per NG, doesn't need either at this time.  Will ^UF to 75-125 cc/hr net neg as tolerated.  2. Hypotension - off pressors as of this am 3. Volume overload - +edematous, up 20-24kg 4. ARDS d/t COVID PNA - per CCM 5. Sepsis - restarted on IV abx (zyvox/ cefepime) 10/20 for suspected HCAP.  6. CAD hx stent     Rob  Hager Compston 12/28/2019, 8:58 AM   Recent Labs  Lab 12/27/19 0315 12/27/19 0330 12/27/19 1743 12/28/19 0416 12/28/19 0500  K  --    < > 4.5  --  4.1  BUN  --    < > 67*  --  71*  CREATININE  --    < > 2.69*  --  2.23*  CALCIUM  --    < > 7.3*  --  6.8*  PHOS  --    < > 5.7*  --  4.4  HGB 7.6*  --   --  7.0*  --    < > = values in this interval not displayed.   Inpatient medications: . chlorhexidine gluconate (MEDLINE KIT)  15 mL Mouth Rinse BID  . Chlorhexidine Gluconate Cloth  6 each Topical Daily  . clopidogrel  75 mg Per Tube Daily  . collagenase   Topical Daily  . darbepoetin (ARANESP) injection - NON-DIALYSIS  100 mcg Subcutaneous Q Sat-1800  . docusate  100 mg Per Tube BID  . feeding supplement (  NEPRO CARB STEADY)  1,000 mL Per Tube Q24H  . feeding supplement (PROSource TF)  90 mL Per Tube BID  . free water  200 mL Per Tube Q8H  . heparin injection (subcutaneous)  5,000 Units Subcutaneous Q8H  . insulin aspart  0-9 Units Subcutaneous Q4H  . mouth rinse  15 mL Mouth Rinse 10 times per day  . metoCLOPramide (REGLAN) injection  5 mg Intravenous Q6H  . pantoprazole sodium  40 mg Per Tube Q1200  . polyethylene glycol  17 g Per Tube Daily  . sevelamer carbonate  2.4 g Per Tube TID WC  . sodium chloride flush  10-40 mL Intracatheter Q12H   .  prismasol BGK 4/2.5 400 mL/hr at 12/28/19 0715  .  prismasol BGK 4/2.5 200 mL/hr at 12/27/19 1827  . sodium chloride Stopped (12/25/19 1418)  . ceFEPime (MAXIPIME) IV Stopped (12/28/19 0541)  . fentaNYL infusion INTRAVENOUS 400 mcg/hr (12/28/19 0800)  . norepinephrine (LEVOPHED) Adult infusion Stopped (12/28/19 9906)  . prismasol BGK 4/2.5 2,000 mL/hr at 12/28/19 0714  .  sodium bicarbonate (isotonic) infusion in sterile water 125 mL/hr at 12/28/19 0800   sodium chloride, acetaminophen (TYLENOL) oral liquid 160 mg/5 mL, albuterol, alteplase, fentaNYL, heparin, hydrALAZINE, midazolam, ondansetron (ZOFRAN) IV, oxymetazoline, sodium  chloride, sodium chloride flush

## 2019-12-29 DIAGNOSIS — U071 COVID-19: Secondary | ICD-10-CM | POA: Diagnosis not present

## 2019-12-29 DIAGNOSIS — Y95 Nosocomial condition: Secondary | ICD-10-CM

## 2019-12-29 DIAGNOSIS — E8770 Fluid overload, unspecified: Secondary | ICD-10-CM | POA: Diagnosis not present

## 2019-12-29 DIAGNOSIS — J189 Pneumonia, unspecified organism: Secondary | ICD-10-CM

## 2019-12-29 DIAGNOSIS — N179 Acute kidney failure, unspecified: Secondary | ICD-10-CM | POA: Diagnosis not present

## 2019-12-29 LAB — CBC
HCT: 25.1 % — ABNORMAL LOW (ref 39.0–52.0)
Hemoglobin: 7.8 g/dL — ABNORMAL LOW (ref 13.0–17.0)
MCH: 31.1 pg (ref 26.0–34.0)
MCHC: 31.1 g/dL (ref 30.0–36.0)
MCV: 100 fL (ref 80.0–100.0)
Platelets: 263 10*3/uL (ref 150–400)
RBC: 2.51 MIL/uL — ABNORMAL LOW (ref 4.22–5.81)
RDW: 18 % — ABNORMAL HIGH (ref 11.5–15.5)
WBC: 23.1 10*3/uL — ABNORMAL HIGH (ref 4.0–10.5)
nRBC: 0.9 % — ABNORMAL HIGH (ref 0.0–0.2)

## 2019-12-29 LAB — RENAL FUNCTION PANEL
Albumin: 2 g/dL — ABNORMAL LOW (ref 3.5–5.0)
Albumin: 1.8 g/dL — ABNORMAL LOW (ref 3.5–5.0)
Anion gap: 6 (ref 5–15)
Anion gap: 5 (ref 5–15)
BUN: 47 mg/dL — ABNORMAL HIGH (ref 8–23)
BUN: 41 mg/dL — ABNORMAL HIGH (ref 8–23)
CO2: 28 mmol/L (ref 22–32)
CO2: 27 mmol/L (ref 22–32)
Calcium: 7.6 mg/dL — ABNORMAL LOW (ref 8.9–10.3)
Calcium: 7.2 mg/dL — ABNORMAL LOW (ref 8.9–10.3)
Chloride: 100 mmol/L (ref 98–111)
Chloride: 103 mmol/L (ref 98–111)
Creatinine, Ser: 1.54 mg/dL — ABNORMAL HIGH (ref 0.61–1.24)
Creatinine, Ser: 1.41 mg/dL — ABNORMAL HIGH (ref 0.61–1.24)
GFR, Estimated: 49 mL/min — ABNORMAL LOW (ref >60)
GFR, Estimated: 55 mL/min — ABNORMAL LOW (ref >60)
Glucose, Bld: 108 mg/dL — ABNORMAL HIGH (ref 70–99)
Glucose, Bld: 104 mg/dL — ABNORMAL HIGH (ref 70–99)
Phosphorus: 3.4 mg/dL (ref 2.5–4.6)
Phosphorus: 3.5 mg/dL (ref 2.5–4.6)
Potassium: 4.2 mmol/L (ref 3.5–5.1)
Potassium: 4 mmol/L (ref 3.5–5.1)
Sodium: 134 mmol/L — ABNORMAL LOW (ref 135–145)
Sodium: 135 mmol/L (ref 135–145)

## 2019-12-29 LAB — MAGNESIUM: Magnesium: 2.5 mg/dL — ABNORMAL HIGH (ref 1.7–2.4)

## 2019-12-29 LAB — GLUCOSE, CAPILLARY
Glucose-Capillary: 106 mg/dL — ABNORMAL HIGH (ref 70–99)
Glucose-Capillary: 97 mg/dL (ref 70–99)
Glucose-Capillary: 91 mg/dL (ref 70–99)
Glucose-Capillary: 99 mg/dL (ref 70–99)
Glucose-Capillary: 88 mg/dL (ref 70–99)

## 2019-12-29 LAB — APTT: aPTT: 38 seconds — ABNORMAL HIGH (ref 24–36)

## 2019-12-29 LAB — D-DIMER, QUANTITATIVE: D-Dimer, Quant: 3.72 ug/mL-FEU — ABNORMAL HIGH (ref 0.00–0.50)

## 2019-12-29 MED ORDER — SODIUM CHLORIDE 0.9 % IV SOLN: 2.0000 g | Freq: Two times a day (BID) | INTRAVENOUS | Status: DC

## 2019-12-29 MED ORDER — PROSOURCE TF PO LIQD
90.0000 mL | Freq: Three times a day (TID) | ORAL | Status: DC
  Administered 2019-12-29 – 2019-12-30 (×3): 90 mL
  Filled 2019-12-29 (×3): qty 90

## 2019-12-29 MED ORDER — NEPRO/CARBSTEADY PO LIQD
1000.0000 mL | ORAL | Status: DC
  Administered 2019-12-29: 1000 mL
  Filled 2019-12-29 (×2): qty 1000

## 2019-12-29 MED ORDER — MIDAZOLAM 50MG/50ML (1MG/ML) PREMIX INFUSION
0.5000 mg/h | INTRAVENOUS | Status: DC
  Administered 2019-12-29: 0.5 mg/h via INTRAVENOUS
  Administered 2019-12-29: 8 mg/h via INTRAVENOUS
  Administered 2019-12-29: 7 mg/h via INTRAVENOUS
  Administered 2019-12-30: 9 mg/h via INTRAVENOUS
  Filled 2019-12-29 (×4): qty 50

## 2019-12-29 MED ORDER — ROCURONIUM BROMIDE 10 MG/ML (PF) SYRINGE
5.0000 mg | PREFILLED_SYRINGE | INTRAVENOUS | Status: AC | PRN
  Administered 2019-12-29 (×2): 5 mg via INTRAVENOUS
  Filled 2019-12-29: qty 10

## 2019-12-29 NOTE — Progress Notes (Addendum)
CVVH filter clotted at 1630, unable to return the blood to pt. Restarted at 1710 without issue.

## 2019-12-29 NOTE — Progress Notes (Signed)
eLink Physician-Brief Progress Note Patient Name: Ronald Olsen DOB: [birth year] MRN: [id number]   Date of Service  12/29/2019  HPI/Events of Note  RN called with concern, dysyn with vent, Pt's resp on vent 40's with vent rate of 35, last ABG on 10/22, RN asking for increase in frequency of PRN versed or one time paralytic  Camera: Unable to rigger vent, looks like getting myopathy-critical care. PIP is < 20. No fever. fenta 400. VS stable. fio2 increased by RT to 100% .  Not on levophed.   Data reviewed.  On CVVH-AKI.   Dr Melvyn Novas notes reviewed.   eICU Interventions  - low dose Rocuronium paralysis push x 2 q1 hr prn - watch for hypotension. - getting prn versed already. - might need trach if not able to wean. Type 2 failure.  - prn CxR/ABG if not better.     Intervention Category Intermediate Interventions: Respiratory distress - evaluation and management  Elmer Sow 12/29/2019, 2:55 AM

## 2019-12-29 NOTE — Progress Notes (Addendum)
Pt dyssynchronous with the ventilator with high RR and O2 Saturations in the low 80s. Bolus of fentanyl and prn versed given with very minimal effect. Dose of prn rocuronium given also with minimal results. Dr. Shearon Stalls made aware and placed order for versed gtt, no additional paralytic at this time.

## 2019-12-29 NOTE — Progress Notes (Signed)
Nutrition Follow-up  DOCUMENTATION CODES:   Not applicable  INTERVENTION:  - will advance TF regimen: Nepro @ 25 ml/hr to advance by 10 ml every 8 hours to reach goal rate of 55 ml/hr with 90 ml Prosource TF TID.  - at goal rate, this regimen will provide 2616 kcal, 173 grams protein, and 960 ml free water.  - free water flush, if desired, to be per CCM.    NUTRITION DIAGNOSIS:   Increased nutrient needs related to acute illness (COVID-19 infection) as evidenced by estimated needs. -ongoing  GOAL:   Patient will meet greater than or equal to 90% of their needs -to be met with TF regimen  MONITOR:   Vent status, TF tolerance, Labs, Weight trends, I & O's  ASSESSMENT:   69 year old male with a past medical history for hypertension, dyslipidemia who presents with dyspnea, malaise, fatigue and cough for about 10 days.  Acute worsening of his symptoms over the last 72 hours prior to hospitalization.  Multiple sick contacts at home, he has not been vaccinated.Patient admitted to the hospital with working diagnosis of acute hypoxic respiratory failure due to SARS COVID-19 viral pneumonia.  Significant Events: 9/20- admission; COVID+ result 9/25- completed remdesivir 10/4- transferred to ICU 10/5- intubated and OGT placed; nasal packing d/t epistaxis  10/6- proned; TF initiation via OGT 10/9- nasal packing removed 10/11- proned 10/13- nasal packing replaced  10/22- CRRT started; TF turned down to trickle rate of 20 ml/hr  Patient remains intubated with OGT in place. He is receiving Nepro @ 20 ml/hr with 90 ml Prosource TF BID. This regimen is providing 1024 kcal, 83 grams protein, and 349 ml free water.   No family/visitors present at this time. Able to talk with RN at bedside and communicate with CCM. Ok to advance TF.   Weight continues to trend up and moderate pitting edema to deep pitting edema to all areas of the body currently documented.   Patient is currently intubated on  ventilator support MV: 9.9 L/min Temp (24hrs), Avg:97.6 F (36.4 C), Min:96.2 F (35.7 C), Max:98.2 F (36.8 C) Propofol: none BP: 116/48 and MAP: 66  Labs reviewed; CBGs: 106 and 97 mg/dl, Na: 134 mmol/l, BUN: 47 mg/dl, creatinine: 1.54 mg/dl, Ca: 7.6 mg/dl, Mg: 2.5 mg/dl, GFR: 49 ml/min. Medications reviewed; 100 mg colace BID, sliding scale novolog, 5 mg IV reglan QID, 40 mg protonix/day, 17 g miralax/day, 2.4 g renvela TID. Drips; fentanyl @ 400 mcg/hr, versed @ 2.5 mg/hr,     Diet Order:   Diet Order            Diet NPO time specified  Diet effective now                 EDUCATION NEEDS:   No education needs have been identified at this time  Skin:  Skin Assessment: Skin Integrity Issues: Skin Integrity Issues:: DTI, Stage I, Other (Comment) DTI: sacrum (10/8); R ear (10/18) Stage I: sacrum (10/8) Other: sacral wound (10/12); R upper back skin tear (10/18); MASD to bilateral buttocks (newly documented 10/22)  Last BM:  10/24  Height:   Ht Readings from Last 1 Encounters:  12/09/19 $RemoveB'5\' 11"'[id number]$  (1.803 m)    Weight:   Wt Readings from Last 1 Encounters:  12/28/19 113.7 kg     Estimated Nutritional Needs:  Kcal:  2640 kcal (30 kcal/kg) Protein:  150-176 grams (1.7-2 grams/kg) Fluid:  >/= 3 L/day     Jarome Matin, MS, RD, LDN, CNSC Inpatient  Clinical Dietitian RD pager # available in [facility]  After hours/weekend pager # available in [facility]

## 2019-12-29 NOTE — Progress Notes (Signed)
[facility] Progress Note  Subjective: I/O net neg 1.8 L yest.  Levo resumed briefly overnight, off again this am. Also made 245 cc urine yesterday  Vitals:   12/29/19 1100 12/29/19 1112 12/29/19 1120 12/29/19 1200  BP:  130/63    Pulse: 97 99    Resp: 18 19    Temp:    (!) 97.1 F (36.2 C)  TempSrc:    Axillary  SpO2: (!) 89% (!) 89% (!) 88%   Weight:      Height:        Exam: on vent ,sedated  no jvd  throat ett in place  Chest cta bilat and lat  Cor reg no RG  Abd soft ntnd no ascites   Ext diffuse 2+ UE and LE edema   Neuro on vent and sedated, no following commands    RUS 10/12 >  12 cm kidneys w/o hydro   UA 10/13 - 30 prot, rbc >50/ 20-50 wbc/ 0-5 epi/ many bact    10/12 -  UNa 46 / UCr 111    CXR 10/22 -  IMPRESSION: Diffuse bilateral airspace disease unchanged.  Summary: 69 yo w/ COVID-19 PNA admitted 9/20,  rx'd w/ IV steroids/ remdesivir and baricitinib. Intubated on 10/5.  Had some hypotension after intubation into 70's, was on pressors, and developed AKI.  RUS was wnl. Did get IV contrast on 9/29, neg for PE. Got IV vanc on 10/3 and 10/4. Creat was normal at 0.98 on 10/02. On 10/06 creat started to rise to and peaked at 4.60 pm 10/13.     Assessment/ Plan: 1. AKI - nonoliguric in pt w/ COVID-19 PNA. ATN most likely due to shock/ acute critical illness. Creat peaked at 4.60 on 10/13 then improved down to 2.9 on 10/17. Then BP's dropped and creat > 6 so CRRT started on 10/22.  CVP's normal. Vol overload on exam. Off of pressors now, good UF yest 1.8 L negative yesterday. Continue UF 100- 125 cc/hr net neg as tolerated. Pt making urine again too.  2. Hypotension - remains off pressor support 3. Volume overload - +edematous, wt's ^^^ 4. ARDS d/t COVID PNA - per CCM 5. Sepsis - restarted on IV abx (zyvox/ cefepime) 10/20 for suspected HCAP.  6. CAD hx stent     Rob Schertz 12/29/2019, 1:40 PM   Recent Labs  Lab 12/28/19 0416 12/28/19 0500  12/28/19 1511 12/29/19 0500  K  --    < > 4.4 4.2  BUN  --    < > 61* 47*  CREATININE  --    < > 1.96* 1.54*  CALCIUM  --    < > 7.6* 7.6*  PHOS  --    < > 3.9 3.4  HGB 7.0*  --   --  7.8*   < > = values in this interval not displayed.   Inpatient medications: . chlorhexidine gluconate (MEDLINE KIT)  15 mL Mouth Rinse BID  . Chlorhexidine Gluconate Cloth  6 each Topical Daily  . clonazePAM  1 mg Per Tube BID  . clopidogrel  75 mg Per Tube Daily  . collagenase   Topical Daily  . darbepoetin (ARANESP) injection - NON-DIALYSIS  100 mcg Subcutaneous Q Sat-1800  . docusate  100 mg Per Tube BID  . feeding supplement (PROSource TF)  90 mL Per Tube TID  . heparin injection (subcutaneous)  5,000 Units Subcutaneous Q8H  . insulin aspart  0-9 Units Subcutaneous Q4H  . mouth rinse  15 mL Mouth Rinse 10 times per day  . metoCLOPramide (REGLAN) injection  5 mg Intravenous Q6H  . pantoprazole sodium  40 mg Per Tube Q1200  . polyethylene glycol  17 g Per Tube Daily  . sevelamer carbonate  2.4 g Per Tube TID WC  . sodium chloride flush  10-40 mL Intracatheter Q12H   .  prismasol BGK 4/2.5 400 mL/hr at 12/29/19 0909  .  prismasol BGK 4/2.5 200 mL/hr at 12/28/19 2018  . sodium chloride 10 mL/hr at 12/29/19 1300  . feeding supplement (NEPRO CARB STEADY) 1,000 mL (12/29/19 1200)  . fentaNYL infusion INTRAVENOUS 400 mcg/hr (12/29/19 1300)  . midazolam 4.5 mg/hr (12/29/19 1300)  . norepinephrine (LEVOPHED) Adult infusion Stopped (12/29/19 0235)  . prismasol BGK 4/2.5 2,000 mL/hr at 12/29/19 1114   sodium chloride, acetaminophen (TYLENOL) oral liquid 160 mg/5 mL, albuterol, alteplase, fentaNYL, heparin, hydrALAZINE, midazolam, ondansetron (ZOFRAN) IV, oxymetazoline, sodium chloride, sodium chloride flush

## 2019-12-29 NOTE — Progress Notes (Addendum)
Pt dyssynchronous with the ventilator most of the night. Giving boluses and Fentanyl (6mcg) as well as PRN Versed Q2hr, given every 2 hrs. Pt began to desat and breathing over ventilator, pt looked very uncomfortable, phoned elink, PRN paralytic ordered and given (5mg ), versed (2mg ) pushed beforehand. Pt had slight improvement but still giving versed Q2hr for dyssynchrony.

## 2019-12-29 NOTE — Progress Notes (Signed)
[facility] Progress Note  Subjective: I/O net neg 1.8 L yest.  Levo resumed briefly overnight, off again this am.   Vitals:   12/29/19 0615 12/29/19 0630 12/29/19 0645 12/29/19 0700  BP:      Pulse: 90 89 90 90  Resp: $Remo'15 19 18 17  'PEqgp$ Temp:      TempSrc:      SpO2: 98% 100% 100% 100%  Weight:      Height:        Exam: on vent ,sedated  no jvd  throat ett in place  Chest cta bilat and lat  Cor reg no RG  Abd soft ntnd no ascites   Ext diffuse 2+ UE and LE edema   Neuro on vent and sedated, no following commands    RUS 10/12 >  12 cm kidneys w/o hydro   UA 10/13 - 30 prot, rbc >50/ 20-50 wbc/ 0-5 epi/ many bact    10/12 -  UNa 46 / UCr 111    CXR 10/22 -  IMPRESSION: Diffuse bilateral airspace disease unchanged.  Summary: 69 yo w/ COVID-19 PNA admitted 9/20,  rx'd w/ IV steroids/ remdesivir and baricitinib. Intubated on 10/5.  Had some hypotension after intubation into 70's, was on pressors, and developed AKI.  RUS was wnl. Did get IV contrast on 9/29, neg for PE. Got IV vanc on 10/3 and 10/4. Creat was normal at 0.98 on 10/02. On 10/06 creat started to rise to and peaked at 4.60 pm 10/13.     Assessment/ Plan: 1. AKI - nonoliguric in pt w/ COVID-19 PNA. ATN most likely due to shock/ acute critical illness. Creat peaked at 4.60 on 10/13 then improved down to 2.9 on 10/17. Then BP's dropped and creat > 6 so CRRT started on 10/22.  CVP's normal. Vol overload on exam. Off of pressors now, good UF yest 1.8 L negative yesterday. Continue UF 100- 125 cc/hr net neg as tolerated.  2. Hypotension - remains off pressor support 3. Volume overload - +edematous, wt's ^^^ 4. ARDS d/t COVID PNA - per CCM 5. Sepsis - restarted on IV abx (zyvox/ cefepime) 10/20 for suspected HCAP.  6. CAD hx stent     Rob Dasiah Hooley 12/29/2019, 7:27 AM   Recent Labs  Lab 12/28/19 0416 12/28/19 0500 12/28/19 1511 12/29/19 0500  K  --    < > 4.4 4.2  BUN  --    < > 61* 47*  CREATININE   --    < > 1.96* 1.54*  CALCIUM  --    < > 7.6* 7.6*  PHOS  --    < > 3.9 3.4  HGB 7.0*  --   --  7.8*   < > = values in this interval not displayed.   Inpatient medications: . chlorhexidine gluconate (MEDLINE KIT)  15 mL Mouth Rinse BID  . Chlorhexidine Gluconate Cloth  6 each Topical Daily  . clonazePAM  1 mg Per Tube BID  . clopidogrel  75 mg Per Tube Daily  . collagenase   Topical Daily  . darbepoetin (ARANESP) injection - NON-DIALYSIS  100 mcg Subcutaneous Q Sat-1800  . docusate  100 mg Per Tube BID  . feeding supplement (NEPRO CARB STEADY)  1,000 mL Per Tube Q24H  . feeding supplement (PROSource TF)  90 mL Per Tube BID  . heparin injection (subcutaneous)  5,000 Units Subcutaneous Q8H  . insulin aspart  0-9 Units Subcutaneous Q4H  . mouth rinse  15 mL Mouth  Rinse 10 times per day  . metoCLOPramide (REGLAN) injection  5 mg Intravenous Q6H  . pantoprazole sodium  40 mg Per Tube Q1200  . polyethylene glycol  17 g Per Tube Daily  . sevelamer carbonate  2.4 g Per Tube TID WC  . sodium chloride flush  10-40 mL Intracatheter Q12H   .  prismasol BGK 4/2.5 400 mL/hr at 12/28/19 2018  .  prismasol BGK 4/2.5 200 mL/hr at 12/28/19 2018  . sodium chloride Stopped (12/25/19 1418)  . ceFEPime (MAXIPIME) IV Stopped (12/29/19 0540)  . fentaNYL infusion INTRAVENOUS 400 mcg/hr (12/29/19 0700)  . norepinephrine (LEVOPHED) Adult infusion Stopped (12/29/19 0235)  . prismasol BGK 4/2.5 2,000 mL/hr at 12/29/19 0615   sodium chloride, acetaminophen (TYLENOL) oral liquid 160 mg/5 mL, albuterol, alteplase, fentaNYL, heparin, hydrALAZINE, midazolam, ondansetron (ZOFRAN) IV, oxymetazoline, rocuronium bromide, sodium chloride, sodium chloride flush

## 2019-12-29 NOTE — Progress Notes (Signed)
NAME:  Ronald Olsen, MRN:  [id number], DOB:  [birth year], LOS: 34 ADMISSION DATE:  11/22/2019, CONSULTATION DATE:  10/4 REFERRING MD:  Doreatha Martin, CHIEF COMPLAINT:  Respiratory failure in setting of COVID-19   Brief History   69 year old male patient admitted on 9/20 with Covid pneumonia. Treated with supplemental oxygen, systemic steroids, remdesivir and baricitinib. Progressive resp failure, intubated 10/5. Evolving ARF. Treated empirically for possible HCAP.   Past Medical History  Hypertension, dyslipidemia. Coronary artery disease  Significant Hospital Events   9/20  Admit, COVID positive, PCT <0.10, BNP neg. High dose steroids, Remdesivir and baricitinib.  9/21 HFNC 15 liters    9/22 CRP down 3.4 but still on high FIO2 9/25 Completed day 5 of Remdesivir. Still on high dose steroids and Baricitinib  9/26 Increased oxygen demand. Placed on NRB and high flow.  9/28 Rapid desaturation w/ any movement  9/29 CT angio negative for PE  10/4 Worsening resp failure, rapid response called. PCCM asked to see w/ tranfers to ICU 10/5 Intubated. Replaced w larger 8.0 ETT 10/5 for intermittent cuffleak. Epistaxis  10/6  Prone positioning -FiO2 0.60, PEEP 10 ,Continued oozing from nare, even after packing.  Heparin held 10/8 FiO2 increased to 60% due to desaturation 10/9 Nasal packing removed 10/11 Ongoing oozing from right nares, draining into mouth, vent dyssynchrony.  Heparin SQ held. P/F 87, prone 10/13 Nose repacked after review with ENT 10/14 Weaned to fentanyl gtt only, followed commands  10/17 weaned again to fentanyl (versed resumed over weekend) 10/18 attempted diuresis, still net positive, Cr slightly improved 10/19 went down on fent gtt overnight, dyssynchronous FiO2 increased, weaning FiO2 back down with pO2 126 10/20 Worsening renal function, stable pCO2 but worsening metabolic acidosis 35/57 started CVVH 10/23 added reglan 5 mg IV q 6 h for Nausea on Tube feeding  10/24 added  clonazepam to reduce air hunger as requiring higher freq dosing with versed prn and is in this for the long run  10/25 ventilator dyssynchrony overnight.  Required rocuronium pushes  Consults:  Pulmonary consulted 10/4 ENT 10/9, 10/13  Procedures:  ETT 10/5 >>  LUE PICC 10/5 >>  R Femoral ALine 10/6 >> 10/12 R Radial Aline 10/14 >> R IJ HD catheter 10/22 >>>  Significant Diagnostic Tests:  9/29 CT Chest 9/29 >> multifocal groundglass attenuations bilaterally through all lobes 10/5 ECHO >> Intact LV function 32-20%, grade 1 diastolic dysfunction, normal RV function and size 10/7 LE Venous duplex >> negative 10/12 Renal US >> unremarkable kidneys, decompressed bladder but diffuse wall thickening  Micro Data:  COVID 9/29 >> Positive BCx2 9/20 >> Negative MRSA  PCR  10/4 > neg  ET 10/20 >  Neg     Antimicrobials:  Remdesivir 9/20 >> 9/25 Vanc 10/3 >> 10/6 Ceftriaxone 10/3 >> 10/4 Cefepime 10/4 >> 10/9 Azitho 10/3 >> 10/6 Unasyn 10/13>>10/20 (ppx for nasal packing)  linezolid 10/20  > 10/23  Cefepime 10/20 >>>   Scheduled Meds: . chlorhexidine gluconate (MEDLINE KIT)  15 mL Mouth Rinse BID  . Chlorhexidine Gluconate Cloth  6 each Topical Daily  . clonazePAM  1 mg Per Tube BID  . clopidogrel  75 mg Per Tube Daily  . collagenase   Topical Daily  . darbepoetin (ARANESP) injection - NON-DIALYSIS  100 mcg Subcutaneous Q Sat-1800  . docusate  100 mg Per Tube BID  . feeding supplement (NEPRO CARB STEADY)  1,000 mL Per Tube Q24H  . feeding supplement (PROSource TF)  90 mL Per Tube BID  .  heparin injection (subcutaneous)  5,000 Units Subcutaneous Q8H  . insulin aspart  0-9 Units Subcutaneous Q4H  . mouth rinse  15 mL Mouth Rinse 10 times per day  . metoCLOPramide (REGLAN) injection  5 mg Intravenous Q6H  . pantoprazole sodium  40 mg Per Tube Q1200  . polyethylene glycol  17 g Per Tube Daily  . sevelamer carbonate  2.4 g Per Tube TID WC  . sodium chloride flush  10-40 mL  Intracatheter Q12H   Continuous Infusions: .  prismasol BGK 4/2.5 400 mL/hr at 12/28/19 2018  .  prismasol BGK 4/2.5 200 mL/hr at 12/28/19 2018  . sodium chloride Stopped (12/25/19 1418)  . ceFEPime (MAXIPIME) IV Stopped (12/29/19 0540)  . fentaNYL infusion INTRAVENOUS 400 mcg/hr (12/29/19 0823)  . midazolam    . norepinephrine (LEVOPHED) Adult infusion Stopped (12/29/19 0235)  . prismasol BGK 4/2.5 2,000 mL/hr at 12/29/19 0615   PRN Meds:.sodium chloride, acetaminophen (TYLENOL) oral liquid 160 mg/5 mL, albuterol, alteplase, fentaNYL, heparin, hydrALAZINE, midazolam, ondansetron (ZOFRAN) IV, oxymetazoline, rocuronium bromide, sodium chloride, sodium chloride flush    Interim history/subjective:  Dyssynchronous with ventilator. Received versed and rocuronium pushes overnight. Achieving net negative fluid balance in the last 24 hours.   Objective   Blood pressure 134/61, pulse 87, temperature (!) 96.2 F (35.7 C), temperature source Axillary, resp. rate 17, height 5' 11" (1.803 m), weight 113.7 kg, SpO2 92 %. CVP:  [6 mmHg-15 mmHg] 15 mmHg  Vent Mode: PRVC FiO2 (%):  [60 %-100 %] 100 % Set Rate:  [35 bmp] 35 bmp Vt Set:  [450 mL] 450 mL PEEP:  [10 cmH20] 10 cmH20 Plateau Pressure:  [23 cmH20-38 cmH20] 36 cmH20   Intake/Output Summary (Last 24 hours) at 12/29/2019 0834 Last data filed at 12/29/2019 0823 Gross per 24 hour  Intake 3383.26 ml  Output 5612 ml  Net -2228.74 ml   Filed Weights   12/26/19 0215 12/27/19 0438 12/28/19 0426  Weight: 115.1 kg 113.7 kg 113.7 kg    CVP:  [6 mmHg-15 mmHg] 15 mmHg    Examination: Intubated and sedated. Endotracheal tube to vent Lungs with a few scattered exp > insp rhonchi bilaterally RRR no s3 or or sign murmur Abd obese, soft, with limited excursion  Diffuse anasarca noted, pitting edema bilaterally  Assessment & Plan:   ARDS secondary to COVID PNA: Overall no improvement. Worsened dead space over time.Combo of edema, evolving  lung injury, possible pneumonia.  He is in the late stage of his Covid disease.  Prognosis is poor. -Completed remdesivir, baricitinib, steroids.  ETT with cuff leak 10/5, changed.  -Continue lung protective ventilation -Currently ventilator settings are too high to consider tracheostomy -Given his ventilator dyssynchrony we will continue fentanyl drip, add Versed drip, if no improvement continue rocuronium pushes.  Acute Metabolic Encephalopathy  Sedation Needs while on Vent   - still on fent drip breathing well above set vent rate -Clonazepam added 10/24.  He will need Versed infusion in addition to this.   Circulatory versus septic shock HCAP: Thick secretions new pressor requirement 10/20. LRCX with gram positives -MAP > 65, pressors as needed - MRSA  PCR neg/ sputum neg for mrsa > d/c'd linezolid per flowsheet  -Cefepime started 10/25.  Will discontinue after today for total 5-day course.  CAD  Hx stent on plavix -continue plavix  -hold lipitor with elevated CK   AKI, Oliguric  Negative renal US, CK 621.  Likely ATN from hypotension and hypoxemia and critical illness. -CRRT started  10/22.  Achieving net negative fluid balance -Appreciate nephrology input  Epistaxis (resolved) Suspect traumatic from NG tube attempt. LE duplex negative, ECHO negative.   Enoxaparin, aspirin, Plavix held. heparin gtt stopped on 10/6.  Re-packed 10/13, on DVT ppx. Nasal packing removed 10/20. - tol sq heparin as of 10/22   Anemia    Lab Results  Component Value Date   HGB 7.8 (L) 12/29/2019   HGB 7.0 (L) 12/28/2019   HGB 7.6 (L) 12/27/2019   due to phlebotomy, critical illness, chronic disease/inflammation - threshold for tx = hgb < 7   Hyperglycemia Mild, suspect steroid-induced -SSI goal less than 180  Best practice:  Diet: TF  Pain/Anxiety/Delirium protocol (if indicated): PAD protocol  VAP protocol (if indicated): Ordered DVT prophylaxis: SCD's, heparin restarted 10/12, see above   GI prophylaxis: PPI Glucose control: SSI    Mobility: Bedrest Code Status: DNR Family Communication  Wife is retired RT discussed at bedside 10/24 and suspect she will req trach/ LTAC when stable. Disposition: ICU     The patient is critically ill with multiple organ systems failure and requires high complexity decision making for assessment and support, frequent evaluation and titration of therapies, application of advanced monitoring technologies and extensive interpretation of multiple databases.   Critical Care Time devoted to patient care services described in this note is 37 minutes. This time reflects time of care of this [place] . This critical care time does not reflect separately billable procedures or procedure time, teaching time or supervisory time of PA/NP/Med student/Med Resident etc but could involve care discussion time.  [facility] 12/29/2019 8:34 AM  Pager: [phone] After hours pager: [phone]

## 2019-12-29 NOTE — TOC Progression Note (Signed)
Transition of Care [facility]) - Progression Note    Patient Details  Name: Delman Goshorn MRN: [id number] Date of Birth: 12/03/1950  [facility] CM/SW Contact  Leeroy Cha, RN Phone Number: 12/29/2019, 8:04 AM  Clinical Narrative:    [place] Hospital Events   9/20  Admit, COVID positive, PCT <0.10, BNP neg. High dose steroids, Remdesivir and baricitinib.  9/21 HFNC 15 liters    9/22 CRP down 3.4 but still on high FIO2 9/25 Completed day 5 of Remdesivir. Still on high dose steroids and Baricitinib  9/26 Increased oxygen demand. Placed on NRB and high flow.  9/28 Rapid desaturation w/ any movement  9/29 CT angio negative for PE  10/4 Worsening resp failure, rapid response called. PCCM asked to see w/ tranfers to ICU 10/5 Intubated. Replaced w larger 8.0 ETT 10/5 for intermittent cuffleak. Epistaxis  10/6  Prone positioning -FiO2 0.60, PEEP 10 ,Continued oozing from nare, even after packing.  Heparin held 10/8 FiO2 increased to 60% due to desaturation 10/9 Nasal packing removed 10/11 Ongoing oozing from right nares, draining into mouth, vent dyssynchrony.  Heparin SQ held. P/F 87, prone 10/13 Nose repacked after review with ENT 10/14 Weaned to fentanyl gtt only, followed commands  10/17 weaned again to fentanyl (versed resumed over weekend) 10/18 attempted diuresis, still net positive, Cr slightly improved 10/19 went down on fent gtt overnight, dyssynchronous FiO2 increased, weaning FiO2 back down with pO2 126 10/20 Worsening renal function, stable pCO2 but worsening metabolic acidosis 09/40 started CVVH 10/23 added reglan 5 mg IV q 6 h for Nausea on Tube feeding  10/24 added clonazepam to reduce air hunger as requiring higher freq dosing with versed prn and is in this for the long run  Best practice:  Diet: TF  Pain/Anxiety/Delirium protocol (if indicated): PAD protocol  VAP protocol (if indicated): Ordered DVT prophylaxis: SCD's, heparin restarted 10/12,  see above  GI prophylaxis: PPI Glucose control: SSI    Mobility: Bedrest Code Status: DNR Family Communication  Wife is retired RT discussed at bedside 10/24 and suspect she will req trach/ LTAC Disposition: ICU   Will alet both ltach's for possible transfer of care.    Expected Discharge Plan: [place] Barriers to Discharge: Barriers Unresolved (comment)  Expected Discharge Plan and Services Expected Discharge Plan: [place]   Discharge Planning Services: CM Consult   Living arrangements for the past 2 months: Single Family Home                                       Social Determinants of Health (SDOH) Interventions    Readmission Risk Interventions No flowsheet data found.

## 2019-12-30 DIAGNOSIS — R040 Epistaxis: Secondary | ICD-10-CM

## 2019-12-30 DIAGNOSIS — I25118 Atherosclerotic heart disease of native coronary artery with other forms of angina pectoris: Secondary | ICD-10-CM | POA: Diagnosis not present

## 2019-12-30 DIAGNOSIS — U071 COVID-19: Secondary | ICD-10-CM | POA: Diagnosis not present

## 2019-12-30 DIAGNOSIS — N179 Acute kidney failure, unspecified: Secondary | ICD-10-CM | POA: Diagnosis not present

## 2019-12-30 LAB — CBC
HCT: 23.1 % — ABNORMAL LOW (ref 39.0–52.0)
Hemoglobin: 7.1 g/dL — ABNORMAL LOW (ref 13.0–17.0)
MCH: 30.9 pg (ref 26.0–34.0)
MCHC: 30.7 g/dL (ref 30.0–36.0)
MCV: 100.4 fL — ABNORMAL HIGH (ref 80.0–100.0)
Platelets: 290 10*3/uL (ref 150–400)
RBC: 2.3 MIL/uL — ABNORMAL LOW (ref 4.22–5.81)
RDW: 18.6 % — ABNORMAL HIGH (ref 11.5–15.5)
WBC: 23.5 10*3/uL — ABNORMAL HIGH (ref 4.0–10.5)
nRBC: 1.1 % — ABNORMAL HIGH (ref 0.0–0.2)

## 2019-12-30 LAB — RENAL FUNCTION PANEL
Albumin: 1.7 g/dL — ABNORMAL LOW (ref 3.5–5.0)
Anion gap: 6 (ref 5–15)
BUN: 36 mg/dL — ABNORMAL HIGH (ref 8–23)
CO2: 27 mmol/L (ref 22–32)
Calcium: 7.5 mg/dL — ABNORMAL LOW (ref 8.9–10.3)
Chloride: 100 mmol/L (ref 98–111)
Creatinine, Ser: 1.35 mg/dL — ABNORMAL HIGH (ref 0.61–1.24)
GFR, Estimated: 58 mL/min — ABNORMAL LOW (ref >60)
Glucose, Bld: 134 mg/dL — ABNORMAL HIGH (ref 70–99)
Phosphorus: 2.4 mg/dL — ABNORMAL LOW (ref 2.5–4.6)
Potassium: 3.9 mmol/L (ref 3.5–5.1)
Sodium: 133 mmol/L — ABNORMAL LOW (ref 135–145)

## 2019-12-30 LAB — GLUCOSE, CAPILLARY
Glucose-Capillary: 107 mg/dL — ABNORMAL HIGH (ref 70–99)
Glucose-Capillary: 109 mg/dL — ABNORMAL HIGH (ref 70–99)
Glucose-Capillary: 120 mg/dL — ABNORMAL HIGH (ref 70–99)

## 2019-12-30 LAB — MAGNESIUM: Magnesium: 2.6 mg/dL — ABNORMAL HIGH (ref 1.7–2.4)

## 2019-12-30 LAB — APTT: aPTT: 61 seconds — ABNORMAL HIGH (ref 24–36)

## 2019-12-30 LAB — D-DIMER, QUANTITATIVE: D-Dimer, Quant: 3.79 ug/mL-FEU — ABNORMAL HIGH (ref 0.00–0.50)

## 2019-12-30 MED ORDER — ACETAMINOPHEN 650 MG RE SUPP: 650.0000 mg | Freq: Four times a day (QID) | RECTAL | Status: DC | PRN

## 2019-12-30 MED ORDER — MIDAZOLAM 50MG/50ML (1MG/ML) PREMIX INFUSION
2.0000 mg/h | INTRAVENOUS | Status: DC
  Administered 2019-12-30: 9 mg/h via INTRAVENOUS
  Filled 2019-12-30: qty 50

## 2019-12-30 MED ORDER — ACETAMINOPHEN 325 MG PO TABS: 650.0000 mg | ORAL_TABLET | Freq: Four times a day (QID) | ORAL | Status: DC | PRN

## 2019-12-30 MED ORDER — GLYCOPYRROLATE 0.2 MG/ML IJ SOLN: 0.2000 mg | INTRAMUSCULAR | Status: DC | PRN

## 2019-12-30 MED ORDER — FENTANYL BOLUS VIA INFUSION
25.0000 ug | INTRAVENOUS | Status: DC | PRN
  Filled 2019-12-30: qty 25

## 2019-12-30 MED ORDER — ARTIFICIAL TEARS OPHTHALMIC OINT
1.0000 "application " | TOPICAL_OINTMENT | Freq: Three times a day (TID) | OPHTHALMIC | Status: DC
  Filled 2019-12-30: qty 3.5

## 2019-12-30 MED ORDER — DIPHENHYDRAMINE HCL 50 MG/ML IJ SOLN: 25.0000 mg | INTRAMUSCULAR | Status: DC | PRN

## 2019-12-30 MED ORDER — MORPHINE 100MG IN NS 100ML (1MG/ML) PREMIX INFUSION
0.0000 mg/h | INTRAVENOUS | Status: DC
  Administered 2019-12-30: 5 mg/h via INTRAVENOUS
  Filled 2019-12-30: qty 100

## 2019-12-30 MED ORDER — MIDAZOLAM BOLUS VIA INFUSION
1.0000 mg | INTRAVENOUS | Status: DC | PRN
  Administered 2019-12-30: 2 mg via INTRAVENOUS
  Filled 2019-12-30: qty 2

## 2019-12-30 MED ORDER — FENTANYL CITRATE (PF) 100 MCG/2ML IJ SOLN: 25.0000 ug | Freq: Once | INTRAMUSCULAR | Status: DC

## 2019-12-30 MED ORDER — DEXTROSE 5 % IV SOLN: INTRAVENOUS | Status: DC

## 2019-12-30 MED ORDER — MORPHINE BOLUS VIA INFUSION
5.0000 mg | INTRAVENOUS | Status: DC | PRN
  Administered 2019-12-30 (×7): 5 mg via INTRAVENOUS
  Filled 2019-12-30: qty 5

## 2019-12-30 MED ORDER — FENTANYL 2500MCG IN NS 250ML (10MCG/ML) PREMIX INFUSION
0.0000 ug/h | INTRAVENOUS | Status: DC
  Filled 2019-12-30: qty 250

## 2019-12-30 MED ORDER — MORPHINE SULFATE (PF) 2 MG/ML IV SOLN: 2.0000 mg | INTRAVENOUS | Status: DC | PRN

## 2019-12-30 MED ORDER — GLYCOPYRROLATE 1 MG PO TABS: 1.0000 mg | ORAL_TABLET | ORAL | Status: DC | PRN

## 2019-12-30 MED ORDER — SODIUM CHLORIDE 0.9 % IV SOLN
0.0000 ug/kg/min | INTRAVENOUS | Status: DC
  Administered 2019-12-30: 1 ug/kg/min via INTRAVENOUS
  Filled 2019-12-30: qty 20

## 2019-12-30 MED ORDER — CISATRACURIUM BOLUS VIA INFUSION
10.0000 mg | Freq: Once | INTRAVENOUS | Status: AC
  Administered 2019-12-30: 10 mg via INTRAVENOUS
  Filled 2019-12-30: qty 10

## 2019-12-30 MED ORDER — POLYVINYL ALCOHOL 1.4 % OP SOLN
1.0000 [drp] | Freq: Four times a day (QID) | OPHTHALMIC | Status: DC | PRN
  Filled 2019-12-30: qty 15

## 2020-01-05 NOTE — Progress Notes (Signed)
Chaplain engaged in initial visit with Mrs. Lotts.  Mrs. Schoolfield shared that she did not expect to have to make a decision like comfort care today.  She was tearful at her husband's bedside.  Mrs. Viernes shared that her husband is a good man.  Chaplain also learned that Mr. Wandrey was really good with his hands and that him and his wife were able to build two homes together.  He was able to make her ideas come to fruition through his building.  She also shared that they have a son and grandson together who have been really close to [place].  Chaplain offered the ministries of support and listening. Chaplain also affirmed the love Mrs. Gunther holds for her husband to make such a hard decision to ensure he does not continue to suffer.    Mrs. Achor noted that her son and pastor are on the way.  Chaplains are available to provide support as needed.     Jan 08, 2020 1200  Clinical Encounter Type  Visited With Patient and family together  Visit Type Initial;Spiritual support  Spiritual Encounters  Spiritual Needs Emotional

## 2020-01-05 NOTE — Consult Note (Signed)
[place] Nurse wound follow up Bedside nurses stated they have just turned the patient and assessed the sacrum wound and it is unchanged from the previous assessment.  Deep tissue pressure injury, evolving to unstageable in patchy areas. Pt is critically ill with multiple systemic factors which can impair healing.  They are on a low air loss mattress to reduce pressure.  Continue present plan of care with Santyl to provide enzymatic debridement of nonviable tissue.  [place] team will continue to assess the location weekly to determine if a change in the topical treatment plan is indicated at that time.   Julien Girt MSN, RN, [place], [place], [place]

## 2020-01-05 NOTE — Procedures (Signed)
Extubation Procedure Note  Patient Details:   Name: Ronald Olsen DOB: [birth year] MRN: [id number]   Airway Documentation:  Airway 8 mm (Active)  Secured at (cm) 25 cm 01-10-20 1158  Measured From Lips 01-10-20 1158  Secured Location Left 01-10-2020 1158  Secured By [employer] 2020/01/10 1158  Tube Holder Repositioned Yes 10-Jan-2020 0804  Cuff Pressure (cm H2O) 28 cm H2O 12/29/19 2139  Site Condition Dry January 10, 2020 0804   Vent end date: 01-10-20 Vent end time: 1348   Evaluation  O2 sats: currently acceptable Complications: No apparent complications  Bilateral Breath Sounds: Rhonchi, Diminished   No   Patient extubated for comfort care. Family at bedside.   Lamonte Sakai January 10, 2020, 1:51 PM

## 2020-01-05 NOTE — Death Summary Note (Signed)
DEATH SUMMARY   Patient Details  Name: Ronald Olsen MRN: [id number] DOB: Mar 13, 1950  Admission/Discharge Information   Admit Date:  2019/12/17  Date of Death: Date of Death: Jan 22, 2020  Time of Death: Time of Death: Jun 17, 1356  Length of Stay: 18-Jun-2034  Referring Physician: Pcp, No   Reason(s) for Hospitalization  covid 19 pneumonia  Diagnoses  Preliminary cause of death: covid 69 pneumonia Secondary Diagnoses (including complications and co-morbidities):  Principal Problem:   Acute hypoxemic respiratory failure due to COVID-19 [facility]) Active Problems:   CAD (coronary artery disease)   Essential hypertension   Hyponatremia   Hyperlipidemia   Acute respiratory distress syndrome (ARDS) due to COVID-19 virus ([place])   Pressure injury of skin   Oral bleeding   Epistaxis   AKI (acute kidney injury) [facility])   Brief Hospital Course (including significant findings, care, treatment, and services provided and events leading to death)  Ronald Olsen is a 69 y.o. year old male who was admitted on December 17, 2022 with Covid pneumonia. Treated with supplemental oxygen, systemic steroids, remdesivir and baricitinib.  He developed progressive respiratory failure, and was intubated 12/09/19.  He required maximum ventilator support including prone positioning, neuromuscular blockade.  Developed subsequent renal failure and required renal replacement therapy. Wife agreed to a time limited trial of dialysis through CRRT. Unfortunately after 5 days of CRRT there was no improvement in his oxygenation or ventilation.  The family made the decision to transition to comfort measures only. He died 2020-01-22 at 13:58. Family was at  Bedside.    Pertinent Labs and Studies  Significant Diagnostic Studies DG Chest 1 View  Result Date: 12/22/2019 CLINICAL DATA:  COVID pneumonia, intubated EXAM: CHEST  1 VIEW COMPARISON:  12/21/2019 FINDINGS: Endotracheal tube and NG tube remain in place, unchanged. Bilateral airspace  opacities, slightly worsening since prior study. Low lung volumes. Heart is borderline in size. No visible effusions or pneumothorax. IMPRESSION: Worsening lung volumes and bilateral airspace disease. Electronically Signed   By: Rolm Baptise M.D.   On: 12/22/2019 17:48   DG Abd 1 View  Result Date: 12/18/2019 CLINICAL DATA:  OG tube placement EXAM: ABDOMEN - 1 VIEW. Portable AP semi erect. Left flank collimated off view. COMPARISON:  X-ray abdomen 12/11/2019, chest x-ray 12/17/2019 FINDINGS: Enteric tube coursing below diaphragm with tip and side port overlying the expected region of the gastric lumen. Tip is likely in the region of the pylorus. The bowel gas pattern is normal. Surgical clips in the upper abdomen. No radio-opaque calculi or other significant radiographic abnormality are seen. Bilateral lower thoraces demonstrate patchy airspace opacities. IMPRESSION: 1. Enteric tube in good position. 2. Nonobstructive bowel gas pattern. 3. Bibasilar pulmonary patchy airspace opacities consistent with known COVID infection. Electronically Signed   By: Iven Finn M.D.   On: 12/18/2019 01:47   CT ANGIO CHEST PE W OR WO CONTRAST  Result Date: 12/03/2019 CLINICAL DATA:  69 year old male with acutely worsening cough and fatigue. High clinical concern for pulmonary embolism and COVID. EXAM: CT ANGIOGRAPHY CHEST WITH CONTRAST TECHNIQUE: Multidetector CT imaging of the chest was performed using the standard protocol during bolus administration of intravenous contrast. Multiplanar CT image reconstructions and MIPs were obtained to evaluate the vascular anatomy. CONTRAST:  188mL OMNIPAQUE IOHEXOL 350 MG/ML SOLN COMPARISON:  None. FINDINGS: Cardiovascular: Satisfactory opacification of the pulmonary arteries to the segmental level. No evidence of pulmonary embolism. Normal heart size. No pericardial effusion. Mediastinum/Nodes: Unremarkable CT appearance of the thyroid gland. No suspicious mediastinal or hilar  adenopathy. No soft tissue mediastinal mass. The thoracic esophagus is unremarkable. Lungs/Pleura: Multifocal regions of geographic ground-glass attenuation airspace opacity scattered throughout all lobes of both lungs. Additionally, there are areas of volume loss and subsegmental atelectasis in the bilateral lower lobes. Moderate respiratory motion artifact. No evidence of pulmonary edema, pleural effusion or pneumothorax. Upper Abdomen: No acute abnormality. Surgical changes of prior cholecystectomy. Colonic interposition. Musculoskeletal: No acute fracture or aggressive appearing lytic or blastic osseous lesion. Review of the MIP images confirms the above findings. IMPRESSION: 1. Negative for acute pulmonary embolus. 2. Multifocal geographic regions of ground-glass attenuation airspace opacity scattered throughout all lobes of both lungs. Findings are most consistent with an acute infectious/inflammatory process such as COVID/viral pneumonia. Electronically Signed   By: Jacqulynn Cadet M.D.   On: 12/03/2019 15:54   US RENAL  Result Date: 12/16/2019 CLINICAL DATA:  69 year old male with acute renal insufficiency. EXAM: RENAL / URINARY TRACT ULTRASOUND COMPLETE COMPARISON:  Renal ultrasound dated 09/30/2019 and CT dated 06/13/2019. FINDINGS: Right Kidney: Renal measurements: 12.9 x 6.5 x 5.6 cm = volume: 247 mL. Echogenicity within normal limits. No mass or hydronephrosis visualized. Left Kidney: Renal measurements: 12.1 x 6.7 x 4.9 cm = volume: 207 mL. Echogenicity within normal limits. No mass or hydronephrosis visualized. Bladder: The urinary bladder is decompressed around a Foley catheter. There is diffuse bladder wall thickening measuring 13 mm which may be partly related to underdistention. Underlying inflammatory or infiltrative process is not excluded. Clinical correlation is recommended. Other: None. IMPRESSION: 1. Unremarkable kidneys. 2. Decompressed urinary bladder with diffuse wall thickening.  Correlation with urinalysis recommended to exclude cystitis. Cystoscopy may provide better evaluation if there is clinical concern for an infiltrative process. Electronically Signed   By: Anner Crete M.D.   On: 12/16/2019 16:51   DG CHEST PORT 1 VIEW  Result Date: 12/26/2019 CLINICAL DATA:  Post dialysis catheter placement EXAM: PORTABLE CHEST 1 VIEW COMPARISON:  12/22/2019 FINDINGS: Right jugular central venous catheter tip in the lower SVC at the cavoatrial junction. No pneumothorax Endotracheal tube in good position.  NG in the stomach. Extensive bilateral airspace disease is present diffusely and unchanged. No significant pleural effusion. IMPRESSION: Right jugular central venous catheter tip at the cavoatrial junction. No pneumothorax Endotracheal tube in good position Diffuse bilateral airspace disease unchanged. Electronically Signed   By: Franchot Gallo M.D.   On: 12/26/2019 16:23   DG Chest Port 1 View  Result Date: 12/21/2019 CLINICAL DATA:  Acute respiratory failure EXAM: PORTABLE CHEST 1 VIEW COMPARISON:  12/20/2019 FINDINGS: Support Apparatus: --Endotracheal tube: Tip at the level of the clavicular heads. --Enteric tube:Tip and sideport project over the stomach. --Catheter(s):Left PICC line tip projects over the lower SVC. --Other: None Unchanged multifocal airspace opacities. IMPRESSION: Endotracheal tube tip at the level of the clavicular heads. Repositioned PICC line tip is likely in the lower SVC. Electronically Signed   By: Ulyses Jarred M.D.   On: 12/21/2019 05:27   DG CHEST PORT 1 VIEW  Result Date: 12/20/2019 CLINICAL DATA:  ARDS due to COVID-19 EXAM: PORTABLE CHEST 1 VIEW COMPARISON:  12/19/2019 FINDINGS: Multifocal pneumonia with relative sparing of the left lung apex, unchanged. No pleural effusion or pneumothorax. Endotracheal tube terminates 3.5 cm above the carina. Left arm PICC likely terminates in the azygos vein. Endotracheal tube courses into the stomach.  IMPRESSION: Multifocal pneumonia in this patient with known COVID, unchanged. Endotracheal tube terminates 3.5 cm above the carina. Left arm PICC likely terminates in the azygos vein.  Electronically Signed   By: Julian Hy M.D.   On: 12/20/2019 06:35   DG CHEST PORT 1 VIEW  Result Date: 12/19/2019 CLINICAL DATA:  ARDS due to COVID 19 virus. EXAM: PORTABLE CHEST 1 VIEW COMPARISON:  One-view chest x-ray 12/17/2019 FINDINGS: Heart size is normal. Endotracheal tube is stable. Enteric tube courses off the inferior border of the film. Interstitial and airspace opacities bilaterally are stable. Bilateral effusions are present. IMPRESSION: 1. Stable appearance of bilateral interstitial and airspace disease compatible with multifocal pneumonia/ARDS. 2. Stable bilateral pleural effusions. Electronically Signed   By: San Morelle M.D.   On: 12/19/2019 06:51   DG CHEST PORT 1 VIEW  Result Date: 12/17/2019 CLINICAL DATA:  Respiratory failure, COVID pneumonia EXAM: PORTABLE CHEST 1 VIEW COMPARISON:  12/17/2019 FINDINGS: No significant interval change in AP portable chest radiograph featuring diffuse bilateral interstitial and heterogeneous airspace opacity. No new airspace opacity. Support apparatus unchanged including endotracheal tube and esophagogastric tube. Left upper extremity PICC. The heart and mediastinum are unremarkable. IMPRESSION: 1. No significant interval change in AP portable chest radiograph featuring diffuse bilateral interstitial and heterogeneous airspace opacity, consistent with COVID airspace disease. No new airspace opacity. 2. Stable support apparatus. Electronically Signed   By: Eddie Candle M.D.   On: 12/17/2019 11:05   DG CHEST PORT 1 VIEW  Result Date: 12/17/2019 CLINICAL DATA:  Acute respiratory distress EXAM: PORTABLE CHEST 1 VIEW COMPARISON:  12/16/2019 FINDINGS: Endotracheal tube is approximately 3.5 cm above the carina. Enteric tube passes into the distal stomach.  Persistent bilateral ill-defined pulmonary opacities. Possible trace pleural effusions. Stable cardiomediastinal contours. No pneumothorax. IMPRESSION: Stable lines and tubes. Similar lung aeration with multifocal pneumonia again seen. Electronically Signed   By: Macy Mis M.D.   On: 12/17/2019 08:16   DG CHEST PORT 1 VIEW  Result Date: 12/16/2019 CLINICAL DATA:  Hypoxia EXAM: PORTABLE CHEST 1 VIEW COMPARISON:  December 14, 2019 FINDINGS: Endotracheal tube tip is 4.2 cm above the carina. Central catheter tip is in the superior vena cava. Nasogastric tube tip and side port are below the diaphragm. No pneumothorax. There is ill-defined airspace opacity throughout the lungs bilaterally, primarily in the mid and lower lung regions, stable. There is also interstitial thickening throughout the lungs, stable. Heart size and pulmonary vascularity are normal. No adenopathy. No bone lesions. IMPRESSION: Tube and catheter positions as described without pneumothorax. Interstitial thickening and hazy airspace opacity bilaterally, stable, consistent with multifocal pneumonia, likely of atypical organism etiology. No new opacity evident. Stable cardiac silhouette. Electronically Signed   By: Lowella Grip III M.D.   On: 12/16/2019 09:02   DG Chest Port 1 View  Result Date: 12/14/2019 CLINICAL DATA:  Respiratory failure EXAM: PORTABLE CHEST 1 VIEW COMPARISON:  December 13, 2019 FINDINGS: The cardiomediastinal silhouette is unchanged in contour.ETT tip terminates 3.8 cm above the carina. LEFT upper extremity PICC tip is curved and projects over the azygos vein. Enteric tube tip and side port projects over the stomach. No pleural effusion. No pneumothorax. Diffuse basilar and peripheral predominant interstitial opacities, unchanged. Status post cholecystectomy. Multilevel degenerative changes of the thoracic spine. IMPRESSION: 1. LEFT upper extremity PICC tip is curved and projects over the azygos vein. Recommend  repositioning. 2. Diffuse bilateral opacities consistent with the sequela of COVID 19 infection are similar comparison to prior. Electronically Signed   By: Valentino Saxon MD   On: 12/14/2019 10:35   DG Chest Port 1 View  Result Date: 12/13/2019 CLINICAL DATA:  Acute respiratory  failure due to COVID pneumonia. EXAM: PORTABLE CHEST 1 VIEW COMPARISON:  12/11/2019; 12/10/2019; 12/09/2019 FINDINGS: Grossly unchanged cardiac silhouette and mediastinal contours. Tip of left upper extremity approach PICC line remains malpositioned overlying expected location of the azygos vein. Otherwise, stable position of remaining support apparatus. No pneumothorax. Grossly unchanged extensive bilateral interstitial and slightly nodular airspace opacities with relative area of confluence within the left lung base. No pleural effusion. No evidence of edema. No acute osseous abnormalities. IMPRESSION: 1. Tip of left upper extremity approach PICC line remains malpositioned overlying expected location of the azygos vein. Repositioning is advised. 2. Otherwise, stable position of remaining support apparatus. No pneumothorax. 3. Similar findings suggestive of multifocal COVID pneumonia, worse within the left base. Electronically Signed   By: Sandi Mariscal M.D.   On: 12/13/2019 12:20   DG Chest Port 1 View  Result Date: 12/11/2019 CLINICAL DATA:  Orogastric tube placement. EXAM: PORTABLE CHEST 1 VIEW COMPARISON:  December 10, 2019. FINDINGS: The heart size and mediastinal contours are within normal limits. No pneumothorax or pleural effusion is noted. Endotracheal and nasogastric tubes are unchanged in position. Left-sided PICC line is again noted. Stable bilateral lung opacities are noted concerning for multifocal pneumonia. The visualized skeletal structures are unremarkable. IMPRESSION: Stable support apparatus. Stable bilateral lung opacities are noted concerning for multifocal pneumonia. Electronically Signed   By: Marijo Conception  M.D.   On: 12/11/2019 11:11   DG Chest Port 1 View  Result Date: 12/10/2019 CLINICAL DATA:  Status post OG tube and ET tube placement. EXAM: PORTABLE CHEST 1 VIEW COMPARISON:  Single-view of the chest earlier today. FINDINGS: Endotracheal tube is in place with the tip in good position at the level of clavicular heads. OG tube courses into the stomach and below the inferior margin of the film. The patient's left PICC has looped back on itself with the tip projecting retrograde in the upper superior vena cava. Right worse than left airspace disease unchanged. IMPRESSION: ETT in good position. Left PICC is now looped with the tip projecting retrograde in the upper superior vena cava. No change in right worse than left airspace disease. Electronically Signed   By: Inge Rise M.D.   On: 12/10/2019 13:45   DG CHEST PORT 1 VIEW  Result Date: 12/10/2019 CLINICAL DATA:  COVID pneumonia EXAM: PORTABLE CHEST 1 VIEW COMPARISON:  Yesterday FINDINGS: Stable low volumes with infiltrate on the right more than left. No effusion or air leak. Endotracheal tube remains in good position. Left PICC with tip at the distal SVC. Remote right clavicle fracture. IMPRESSION: 1. Stable inflation and infiltrates. 2. New PICC in good position. Electronically Signed   By: Monte Fantasia M.D.   On: 12/10/2019 07:26   DG CHEST PORT 1 VIEW  Result Date: 12/09/2019 CLINICAL DATA:  COVID-19 positivity with difficulty breathing check endotracheal tube placement EXAM: PORTABLE CHEST 1 VIEW COMPARISON:  12/09/2019 FINDINGS: Endotracheal tube is noted in satisfactory position. Lungs are well aerated with patchy airspace opacities right greater than left consistent with the given clinical history. No new focal abnormality is seen. No bony abnormality is noted. IMPRESSION: Stable bilateral airspace opacities consistent with the given clinical history. Electronically Signed   By: Inez Catalina M.D.   On: 12/09/2019 21:40   DG CHEST PORT 1  VIEW  Result Date: 12/09/2019 CLINICAL DATA:  Intubation EXAM: PORTABLE CHEST 1 VIEW COMPARISON:  11/27/2019, 12/09/2019, 12/07/2019 FINDINGS: Endotracheal tube tip is about 3.3 cm superior to the carina. No significant  change in bilateral peripheral and basilar heterogeneous airspace opacities. Stable cardiomediastinal silhouette. No pneumothorax. IMPRESSION: Endotracheal tube tip about 3.3 cm superior to the carina. No significant interval change in bilateral peripheral and basilar heterogeneous airspace opacities consistent with history of COVID pneumonia Electronically Signed   By: Donavan Foil M.D.   On: 12/09/2019 18:55   DG Chest Port 1 View  Result Date: 12/09/2019 CLINICAL DATA:  Endotracheal tube in place. Respiratory failure due to COVID-19. EXAM: PORTABLE CHEST 1 VIEW COMPARISON:  Radiograph 12/07/2019 FINDINGS: Placement of endotracheal tube with tip 3.4 cm from the carina. Low lung volumes persist. Stable patchy heterogeneous bilateral airspace opacities in a mid-lower lung zone predominant distribution. No evidence of pneumomediastinum. Stable heart size and mediastinal contours. No pneumothorax or large pleural effusion. Gaseous gastric distention in the upper abdomen partially included. IMPRESSION: 1. Placement of endotracheal tube with tip 3.4 cm from the carina. 2. Unchanged patchy heterogeneous bilateral airspace opacities consistent with COVID-19 pneumonia. Electronically Signed   By: Keith Rake M.D.   On: 12/09/2019 17:40   DG CHEST PORT 1 VIEW  Result Date: 12/07/2019 CLINICAL DATA:  Increase in work of breathing. Pt is positive for Covid-19. H/o HTN. Former smoker. EXAM: PORTABLE CHEST 1 VIEW COMPARISON:  Chest radiograph 12/06/2019 FINDINGS: Stable cardiomediastinal contours. Low lung volumes. Diffuse infiltrates in the right lung, increased peripherally. Persistent infiltrates in the lower left lung. No pneumothorax or significant pleural effusion. No acute finding in the  visualized skeleton. IMPRESSION: Increasing infiltrates in the peripheral right lung. Electronically Signed   By: Audie Pinto M.D.   On: 12/07/2019 17:47   DG CHEST PORT 1 VIEW  Result Date: 12/06/2019 CLINICAL DATA:  Shortness of breath EXAM: PORTABLE CHEST 1 VIEW COMPARISON:  November 27, 2019, December 03, 2019 FINDINGS: The cardiomediastinal silhouette is unchanged in contour. No pleural effusion. No pneumothorax. Increased diffuse bilateral bilateral peripheral predominant heterogeneous opacities since November 27, 2019, consistent with the sequela of COVID-19 infection. Visualized abdomen is unremarkable. Mild degenerative changes of the thoracic spine. IMPRESSION: Increased diffuse bilateral peripheral predominant heterogeneous opacities, consistent with the sequela of COVID-19 infection. Electronically Signed   By: Valentino Saxon MD   On: 12/06/2019 10:22   DG Abd Portable 1V  Result Date: 12/11/2019 CLINICAL DATA:  NG tube placement EXAM: PORTABLE ABDOMEN - 1 VIEW COMPARISON:  Radiograph 10/11/2019, CT 12/03/2019 FINDINGS: NG tube within the stomach with side port below the GE junction. Tip points towards the gastric pylorus. Large volume stool in the colon. IMPRESSION: Gastric tube in good position Electronically Signed   By: Suzy Bouchard M.D.   On: 12/11/2019 14:02   DG Abd Portable 1V  Result Date: 12/11/2019 CLINICAL DATA:  Orogastric tube placement. EXAM: PORTABLE ABDOMEN - 1 VIEW COMPARISON:  December 10, 2019. FINDINGS: The bowel gas pattern is normal. Distal tip of enteric tube is seen in proximal stomach, with side hole in the distal esophagus. This is not significantly changed compared to prior exam. No radio-opaque calculi or other significant radiographic abnormality are seen. IMPRESSION: Distal tip of enteric tube is seen in proximal stomach, with side hole in distal esophagus. This is not significantly changed compared to prior exam. Electronically Signed   By: Marijo Conception M.D.   On: 12/11/2019 11:12   DG Abd Portable 1V  Result Date: 12/10/2019 CLINICAL DATA:  Status post OG tube placement. EXAM: PORTABLE ABDOMEN - 1 VIEW COMPARISON:  None. FINDINGS: OG tube is in place with side port  just above the stomach. Recommend advancement of 3-4 cm. IMPRESSION: As above. Electronically Signed   By: Inge Rise M.D.   On: 12/10/2019 13:43   ECHOCARDIOGRAM COMPLETE  Result Date: 12/09/2019    ECHOCARDIOGRAM REPORT   Patient Name:   Ronald Olsen Date of Exam: 12/09/2019 Medical Rec #:  [id number]           Height:       71.0 in Accession #:    [id number]          Weight:       194.0 lb Date of Birth:  [birth year]           BSA:          2.081 m Patient Age:    69 years            BP:           147/125 mmHg Patient Gender: M                   HR:           103 bpm. Exam Location:  Inpatient Procedure: 2D Echo Indications:    dyspnea 786.09  History:        Patient has no prior history of Echocardiogram examinations.                 Risk Factors:Hypertension, Dyslipidemia and Former Smoker. Covid                 +.  Sonographer:    Jannett Celestine RDCS (AE) Referring Phys: [place] Comments: Suboptimal subcostal window. Image acquisition challenging due to respiratory motion. IMPRESSIONS  1. Left ventricular ejection fraction, by estimation, is 65 to 70%. The left ventricle has normal function. The left ventricle has no regional wall motion abnormalities. Left ventricular diastolic parameters are consistent with Grade I diastolic dysfunction (impaired relaxation).  2. Right ventricular systolic function is normal. The right ventricular size is normal.  3. The mitral valve is normal in structure. Trivial mitral valve regurgitation. No evidence of mitral stenosis.  4. The aortic valve is normal in structure. Aortic valve regurgitation is not visualized. No aortic stenosis is present. FINDINGS  Left Ventricle: Left ventricular ejection fraction, by  estimation, is 65 to 70%. The left ventricle has normal function. The left ventricle has no regional wall motion abnormalities. The left ventricular internal cavity size was normal in size. There is  no left ventricular hypertrophy. Left ventricular diastolic parameters are consistent with Grade I diastolic dysfunction (impaired relaxation). Normal left ventricular filling pressure. Right Ventricle: The right ventricular size is normal. No increase in right ventricular wall thickness. Right ventricular systolic function is normal. Left Atrium: Left atrial size was normal in size. Right Atrium: Right atrial size was normal in size. Pericardium: There is no evidence of pericardial effusion. Mitral Valve: The mitral valve is normal in structure. Trivial mitral valve regurgitation. No evidence of mitral valve stenosis. Tricuspid Valve: The tricuspid valve is normal in structure. Tricuspid valve regurgitation is mild . No evidence of tricuspid stenosis. Aortic Valve: The aortic valve is normal in structure. Aortic valve regurgitation is not visualized. No aortic stenosis is present. Pulmonic Valve: The pulmonic valve was normal in structure. Pulmonic valve regurgitation is trivial. No evidence of pulmonic stenosis. Aorta: The aortic root is normal in size and structure. Venous: The inferior vena cava was not well visualized. IAS/Shunts: No atrial level shunt detected by color  flow Doppler.  LEFT VENTRICLE PLAX 2D LVIDd:         2.90 cm  Diastology LVIDs:         1.80 cm  LV e' medial:    6.85 cm/s LV PW:         1.00 cm  LV E/e' medial:  8.6 LV IVS:        1.00 cm  LV e' lateral:   10.20 cm/s LVOT diam:     2.10 cm  LV E/e' lateral: 5.8 LV SV:         78 LV SV Index:   38 LVOT Area:     3.46 cm  RIGHT VENTRICLE RV S prime:     15.40 cm/s TAPSE (M-mode): 1.9 cm LEFT ATRIUM           Index LA diam:      3.10 cm 1.49 cm/m LA Vol (A2C): 54.5 ml 26.18 ml/m  AORTIC VALVE LVOT Vmax:   117.00 cm/s LVOT Vmean:  93.700 cm/s  LVOT VTI:    0.226 m  AORTA Ao Root diam: 2.90 cm MITRAL VALVE               TRICUSPID VALVE MV Area (PHT): 3.27 cm    TR Peak grad:   40.4 mmHg MV Decel Time: 232 msec    TR Vmax:        318.00 cm/s MV E velocity: 58.70 cm/s MV A velocity: 73.30 cm/s  SHUNTS MV E/A ratio:  0.80        Systemic VTI:  0.23 m                            Systemic Diam: 2.10 cm Skeet Latch MD Electronically signed by Skeet Latch MD Signature Date/Time: 12/09/2019/9:26:40 AM    Final    VAS [place] LOWER EXTREMITY VENOUS (DVT)  Result Date: 12/24/2019  Lower Venous DVTStudy Indications: Elevated ddimer.  Comparison Study: 12/11/19 previous Performing Technologist: Abram Sander RVS  Examination Guidelines: A complete evaluation includes B-mode imaging, spectral Doppler, color Doppler, and power Doppler as needed of all accessible portions of each vessel. Bilateral testing is considered an integral part of a complete examination. Limited examinations for reoccurring indications may be performed as noted. The reflux portion of the exam is performed with the patient in reverse Trendelenburg.  +---------+---------------+---------+-----------+----------+--------------+ RIGHT    CompressibilityPhasicitySpontaneityPropertiesThrombus Aging +---------+---------------+---------+-----------+----------+--------------+ CFV      Full           Yes      Yes                                 +---------+---------------+---------+-----------+----------+--------------+ SFJ      Full                                                        +---------+---------------+---------+-----------+----------+--------------+ FV Prox  Full                                                        +---------+---------------+---------+-----------+----------+--------------+ FV  Mid   Full                                                        +---------+---------------+---------+-----------+----------+--------------+ FV DistalFull                                                         +---------+---------------+---------+-----------+----------+--------------+ PFV      Full                                                        +---------+---------------+---------+-----------+----------+--------------+ POP      Full           Yes      Yes                                 +---------+---------------+---------+-----------+----------+--------------+ PTV      Full                                                        +---------+---------------+---------+-----------+----------+--------------+ PERO     Full                                                        +---------+---------------+---------+-----------+----------+--------------+   +---------+---------------+---------+-----------+----------+--------------+ LEFT     CompressibilityPhasicitySpontaneityPropertiesThrombus Aging +---------+---------------+---------+-----------+----------+--------------+ CFV      Full           Yes      Yes                                 +---------+---------------+---------+-----------+----------+--------------+ SFJ      Full                                                        +---------+---------------+---------+-----------+----------+--------------+ FV Prox  Full                                                        +---------+---------------+---------+-----------+----------+--------------+ FV Mid   Full                                                        +---------+---------------+---------+-----------+----------+--------------+  FV DistalFull                                                        +---------+---------------+---------+-----------+----------+--------------+ PFV      Full                                                        +---------+---------------+---------+-----------+----------+--------------+ POP      Full           Yes      Yes                                  +---------+---------------+---------+-----------+----------+--------------+ PTV      Full                                                        +---------+---------------+---------+-----------+----------+--------------+ PERO     Full                                                        +---------+---------------+---------+-----------+----------+--------------+     Summary: BILATERAL: - No evidence of deep vein thrombosis seen in the lower extremities, bilaterally. - No evidence of superficial venous thrombosis in the lower extremities, bilaterally. -   *See table(s) above for measurements and observations. Electronically signed by Harold Barban MD on 12/24/2019 at 7:40:55 PM.    Final    VAS [place] LOWER EXTREMITY VENOUS (DVT)  Result Date: 12/11/2019  Lower Venous DVT Study Indications: Covid-19, elevated D-dimer.  Limitations: Ventilator. Comparison Study: No prior study on file Performing Technologist: Sharion Dove RVS  Examination Guidelines: A complete evaluation includes B-mode imaging, spectral Doppler, color Doppler, and power Doppler as needed of all accessible portions of each vessel. Bilateral testing is considered an integral part of a complete examination. Limited examinations for reoccurring indications may be performed as noted. The reflux portion of the exam is performed with the patient in reverse Trendelenburg.  +---------+---------------+---------+-----------+----------+--------------+ RIGHT    CompressibilityPhasicitySpontaneityPropertiesThrombus Aging +---------+---------------+---------+-----------+----------+--------------+ CFV      Full           Yes      Yes                                 +---------+---------------+---------+-----------+----------+--------------+ SFJ      Full                                                        +---------+---------------+---------+-----------+----------+--------------+ FV Prox  Full                                                         +---------+---------------+---------+-----------+----------+--------------+  FV Mid   Full                                                        +---------+---------------+---------+-----------+----------+--------------+ FV DistalFull                                                        +---------+---------------+---------+-----------+----------+--------------+ PFV      Full                                                        +---------+---------------+---------+-----------+----------+--------------+ POP      Full           Yes      Yes                                 +---------+---------------+---------+-----------+----------+--------------+ PTV      Full                                                        +---------+---------------+---------+-----------+----------+--------------+ PERO     Full                                                        +---------+---------------+---------+-----------+----------+--------------+   +---------+---------------+---------+-----------+----------+--------------+ LEFT     CompressibilityPhasicitySpontaneityPropertiesThrombus Aging +---------+---------------+---------+-----------+----------+--------------+ CFV      Full           Yes      Yes                                 +---------+---------------+---------+-----------+----------+--------------+ SFJ      Full                                                        +---------+---------------+---------+-----------+----------+--------------+ FV Prox  Full                                                        +---------+---------------+---------+-----------+----------+--------------+ FV Mid   Full                                                        +---------+---------------+---------+-----------+----------+--------------+  FV DistalFull                                                         +---------+---------------+---------+-----------+----------+--------------+ PFV      Full                                                        +---------+---------------+---------+-----------+----------+--------------+ POP      Full           Yes      Yes                  sluggish flow  +---------+---------------+---------+-----------+----------+--------------+ PTV      Full                                                        +---------+---------------+---------+-----------+----------+--------------+ PERO     Full                                                        +---------+---------------+---------+-----------+----------+--------------+     Summary: BILATERAL: - No evidence of deep vein thrombosis seen in the lower extremities, bilaterally. -   *See table(s) above for measurements and observations. Electronically signed by Monica Martinez MD on 12/11/2019 at 3:29:52 PM.    Final    [place] EKG SITE RITE  Result Date: 12/09/2019 If Site Rite image not attached, placement could not be confirmed due to current cardiac rhythm.   Microbiology Recent Results (from the past 240 hour(s))  Culture, respiratory (non-expectorated)     Status: None   Collection Time: 12/24/19 12:06 PM   Specimen: Tracheal Aspirate; Respiratory  Result Value Ref Range Status   Specimen Description   Final    TRACHEAL ASPIRATE Performed at [facility] [address], [place], [place] [address]    Special Requests   Final    NONE Performed at [facility], [place] [address], [place], [place] [address]    Gram Stain   Final    ABUNDANT WBC PRESENT, PREDOMINANTLY PMN ABUNDANT GRAM POSITIVE RODS FEW GRAM POSITIVE COCCI    Culture   Final    FEW Normal respiratory flora-no Staph aureus or Pseudomonas seen Performed at [facility] [address], [place], [place] [address]    Report Status 12/26/2019 FINAL  Final    Lab Basic Metabolic  Panel: Recent Labs  Lab 12/26/19 0922 12/27/19 0315 12/27/19 0330 12/27/19 1743 12/28/19 0416 12/28/19 0500 12/28/19 1511 12/29/19 0500 12/29/19 1630 01-19-2020 0250  NA 136  --    < >   < >  --  133* 136 134* 135 133*  K 5.3*  --    < >   < >  --  4.1 4.4 4.2 4.0 3.9  CL 95*  --    < >   < >  --  99 97* 100 103 100  CO2 26  --    < >   < >  --  28 29 28 27 27   GLUCOSE 135*  --    < >   < >  --  96 97 108* 104* 134*  BUN 225*  --    < >   < >  --  71* 61* 47* 41* 36*  CREATININE 6.09*  --    < >   < >  --  2.23* 1.96* 1.54* 1.41* 1.35*  CALCIUM 7.9*  --    < >   < >  --  6.8* 7.6* 7.6* 7.2* 7.5*  MG 3.4* 2.9*  --   --  2.5*  --   --  2.5*  --  2.6*  PHOS 11.8*  --    < >   < >  --  4.4 3.9 3.4 3.5 2.4*   < > = values in this interval not displayed.   Liver Function Tests: Recent Labs  Lab 12/28/19 0500 12/28/19 1511 12/29/19 0500 12/29/19 1630 01-14-20 0250  ALBUMIN 1.9* 1.9* 2.0* 1.8* 1.7*   No results for input(s): LIPASE, AMYLASE in the last 168 hours. No results for input(s): AMMONIA in the last 168 hours. CBC: Recent Labs  Lab 12/26/19 0922 12/27/19 0315 12/28/19 0416 12/29/19 0500 01-14-20 0250  WBC 19.3* 18.2* 17.3* 23.1* 23.5*  HGB 7.6* 7.6* 7.0* 7.8* 7.1*  HCT 24.4* 24.3* 22.6* 25.1* 23.1*  MCV 97.6 97.6 100.0 100.0 100.4*  PLT 302 393 334 263 290   Cardiac Enzymes: No results for input(s): CKTOTAL, CKMB, CKMBINDEX, TROPONINI in the last 168 hours. Sepsis Labs: Recent Labs  Lab 12/27/19 0315 12/28/19 0416 12/29/19 0500 01/14/20 0250  WBC 18.2* 17.3* 23.1* 23.5*   Lenice Llamas, MD Pulmonary and [place] Pager: [phone] Office:[phone]

## 2020-01-05 NOTE — Progress Notes (Signed)
MD verbal order to discontinue CRRT when filter needed to be changed. Paralytic agent turned off per protocol for transitioning to comfort care. Family bedside, chaplain consulted. Will continue to provide support.

## 2020-01-05 NOTE — Progress Notes (Addendum)
41 mL morphine wasted with Rayfield Citizen, RN.

## 2020-01-05 NOTE — Progress Notes (Addendum)
NAME:  San Lohmeyer, MRN:  [id number], DOB:  1950/12/12, LOS: 22 ADMISSION DATE:  11/22/2019, CONSULTATION DATE:  10/4 REFERRING MD:  Doreatha Martin, CHIEF COMPLAINT:  Respiratory failure in setting of COVID-19   Brief History   69 year old male patient admitted on 9/20 with Covid pneumonia. Treated with supplemental oxygen, systemic steroids, remdesivir and baricitinib. Progressive resp failure, intubated 10/5. Evolving ARF. Treated empirically for possible HCAP.   Past Medical History  Hypertension, dyslipidemia. Coronary artery disease  Significant Hospital Events   9/20  Admit, COVID positive, PCT <0.10, BNP neg. High dose steroids, Remdesivir and baricitinib.  9/21 HFNC 15 liters    9/22 CRP down 3.4 but still on high FIO2 9/25 Completed day 5 of Remdesivir. Still on high dose steroids and Baricitinib  9/26 Increased oxygen demand. Placed on NRB and high flow.  9/28 Rapid desaturation w/ any movement  9/29 CT angio negative for PE  10/4 Worsening resp failure, rapid response called. PCCM asked to see w/ tranfers to ICU 10/5 Intubated. Replaced w larger 8.0 ETT 10/5 for intermittent cuffleak. Epistaxis  10/6  Prone positioning -FiO2 0.60, PEEP 10 ,Continued oozing from nare, even after packing.  Heparin held 10/8 FiO2 increased to 60% due to desaturation 10/9 Nasal packing removed 10/11 Ongoing oozing from right nares, draining into mouth, vent dyssynchrony.  Heparin SQ held. P/F 87, prone 10/13 Nose repacked after review with ENT 10/14 Weaned to fentanyl gtt only, followed commands  10/17 weaned again to fentanyl (versed resumed over weekend) 10/18 attempted diuresis, still net positive, Cr slightly improved 10/19 went down on fent gtt overnight, dyssynchronous FiO2 increased, weaning FiO2 back down with pO2 126 10/20 Worsening renal function, stable pCO2 but worsening metabolic acidosis 00/17 started CVVH 10/23 added reglan 5 mg IV q 6 h for Nausea on Tube feeding  10/24 added  clonazepam to reduce air hunger as requiring higher freq dosing with versed prn and is in this for the long run  10/25 ventilator dyssynchrony overnight.  Required rocuronium pushes  Consults:  Pulmonary consulted 10/4 ENT 10/9, 10/13  Procedures:  ETT 10/5 >>  LUE PICC 10/5 >>  R Femoral ALine 10/6 >> 10/12 R Radial Aline 10/14 >> R IJ HD catheter 10/22 >>>  Significant Diagnostic Tests:  9/29 CT Chest 9/29 >> multifocal groundglass attenuations bilaterally through all lobes 10/5 ECHO >> Intact LV function 49-44%, grade 1 diastolic dysfunction, normal RV function and size 10/7 LE Venous duplex >> negative 10/12 Renal US >> unremarkable kidneys, decompressed bladder but diffuse wall thickening  Micro Data:  COVID 9/29 >> Positive BCx2 9/20 >> Negative MRSA  PCR  10/4 > neg  ET 10/20 >  Neg     Antimicrobials:  Remdesivir 9/20 >> 9/25 Vanc 10/3 >> 10/6 Ceftriaxone 10/3 >> 10/4 Cefepime 10/4 >> 10/9 Azitho 10/3 >> 10/6 Unasyn 10/13>>10/20 (ppx for nasal packing)  linezolid 10/20  > 10/23  Cefepime 10/20 >>>10/25   Scheduled Meds: . chlorhexidine gluconate (MEDLINE KIT)  15 mL Mouth Rinse BID  . Chlorhexidine Gluconate Cloth  6 each Topical Daily  . clonazePAM  1 mg Per Tube BID  . clopidogrel  75 mg Per Tube Daily  . collagenase   Topical Daily  . darbepoetin (ARANESP) injection - NON-DIALYSIS  100 mcg Subcutaneous Q Sat-1800  . docusate  100 mg Per Tube BID  . feeding supplement (PROSource TF)  90 mL Per Tube TID  . heparin injection (subcutaneous)  5,000 Units Subcutaneous Q8H  . insulin  aspart  0-9 Units Subcutaneous Q4H  . mouth rinse  15 mL Mouth Rinse 10 times per day  . metoCLOPramide (REGLAN) injection  5 mg Intravenous Q6H  . pantoprazole sodium  40 mg Per Tube Q1200  . polyethylene glycol  17 g Per Tube Daily  . sevelamer carbonate  2.4 g Per Tube TID WC  . sodium chloride flush  10-40 mL Intracatheter Q12H   Continuous Infusions: .  prismasol BGK  4/2.5 400 mL/hr at 12/29/19 2215  .  prismasol BGK 4/2.5 200 mL/hr at 12/29/19 2214  . sodium chloride 10 mL/hr at 19-Jan-2020 0800  . feeding supplement (NEPRO CARB STEADY) 1,000 mL (12/29/19 1200)  . fentaNYL infusion INTRAVENOUS 400 mcg/hr (Jan 19, 2020 0800)  . midazolam 9 mg/hr (2020-01-19 0800)  . norepinephrine (LEVOPHED) Adult infusion 10 mcg/min (2020-01-19 0800)  . prismasol BGK 4/2.5 2,000 mL/hr at 01-19-2020 0813   PRN Meds:.sodium chloride, acetaminophen (TYLENOL) oral liquid 160 mg/5 mL, albuterol, alteplase, fentaNYL, heparin, hydrALAZINE, midazolam, ondansetron (ZOFRAN) IV, oxymetazoline, sodium chloride, sodium chloride flush    Interim history/subjective:  Overnight titrated up on Versed drip.  Now on 9 and max on fentanyl as well.  Some improved ventilator synchrony.  Reportedly did not respond well to paralytic flushes yesterday.  Objective   Blood pressure (!) 110/43, pulse (!) 57, temperature (!) 96 F (35.6 C), temperature source Axillary, resp. rate (!) 42, height $RemoveBe'5\' 11"'VJIbsDFeU$  (1.803 m), weight 113.7 kg, SpO2 93 %. CVP:  [7 mmHg-14 mmHg] 11 mmHg  Vent Mode: PRVC FiO2 (%):  [80 %-100 %] 100 % Set Rate:  [35 bmp] 35 bmp Vt Set:  [450 mL] 450 mL PEEP:  [10 cmH20] 10 cmH20 Plateau Pressure:  [15 cmH20-24 cmH20] 20 cmH20   Intake/Output Summary (Last 24 hours) at January 19, 2020 7106 Last data filed at 2020/01/19 0800 Gross per 24 hour  Intake 2708.27 ml  Output 4153 ml  Net -1444.73 ml   Filed Weights   12/26/19 0215 12/27/19 0438 12/28/19 0426  Weight: 115.1 kg 113.7 kg 113.7 kg    CVP:  [7 mmHg-14 mmHg] 11 mmHg    Examination: Intubated and sedated.  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 Worsened dead space  over time.Combo of edema, evolving lung injury, possible pneumonia.  He is in the late stage of his Covid disease.  Prognosis is poor. -Completed remdesivir, baricitinib, steroids.  ETT with cuff leak 10/5, changed.  -Continue lung protective ventilation -Currently ventilator settings are too high to consider tracheostomy -Continue deep sedation with Versed and fentanyl drip.  He is not able to titrate down due to the ventilator dyssynchrony.  Acute Metabolic Encephalopathy  Sedation Needs while on Vent   -Continue Versed, fentanyl, clonazepam.  May need out of paralytic.   Circulatory versus septic shock HCAP: Thick secretions new pressor requirement 10/20. LRCX with gram positives -MAP > 65, pressors as needed - MRSA  PCR neg/ sputum neg for mrsa > d/c'd linezolid per flowsheet  -Completed a 5-day course of cefepime on 10/25  CAD  Hx stent on plavix -continue plavix  -hold lipitor with elevated CK   AKI, Oliguric  Negative renal US, CK 621.  Likely ATN from hypotension and hypoxemia and critical illness. -CRRT started 10/22.  Achieving  net negative fluid balance -Appreciate nephrology input  Epistaxis (resolved) Suspect traumatic from NG tube attempt. LE duplex negative, ECHO negative.   Enoxaparin, aspirin, Plavix held. heparin gtt stopped on 10/6.  Re-packed 10/13, on DVT ppx. Nasal packing removed 10/20. -Tolerating subcu heparin  Anemia    Lab Results  Component Value Date   HGB 7.1 (L) 01/15/20   HGB 7.8 (L) 12/29/2019   HGB 7.0 (L) 12/28/2019   due to phlebotomy, critical illness, chronic disease/inflammation - threshold for tx = hgb < 7   Hyperglycemia Mild, suspect steroid-induced -SSI goal less than 180  Best practice:  Diet: TF  Pain/Anxiety/Delirium protocol (if indicated): PAD protocol  VAP protocol (if indicated): Ordered DVT prophylaxis: SCD's, subcu heparin GI prophylaxis: PPI Glucose control: SSI    Mobility: Bedrest Code Status: DNR Family  Communication: will update the wife today. Disposition: ICU     ADDENDUM: Patient's wife came to bedside. She was asking about what to do now that he has not done well with a time-limited trial of CRRT for the past 5 days. She longer want him to suffer. She will call her son and plan is for transition to comfort measures.  The patient is critically ill with multiple organ systems failure and requires high complexity decision making for assessment and support, frequent evaluation and titration of therapies, application of advanced monitoring technologies and extensive interpretation of multiple databases.   Critical Care Time devoted to patient care services described in this note is 55 minutes. This time reflects time of care of this [place] . This critical care time does not reflect separately billable procedures or procedure time, teaching time or supervisory time of PA/NP/Med student/Med Resident etc but could involve care discussion time.  [facility] 01-15-20 8:22 AM  Pager: [phone] After hours pager: [phone]

## 2020-01-05 DEATH — deceased

## 2020-12-22 IMAGING — DX DG CHEST 1V PORT
1 series · 1 of 1 positions shown · non-contrast
Comparison: None.

CLINICAL DATA: Shortness of breath

EXAM:
PORTABLE CHEST 1 VIEW

[chest ap]
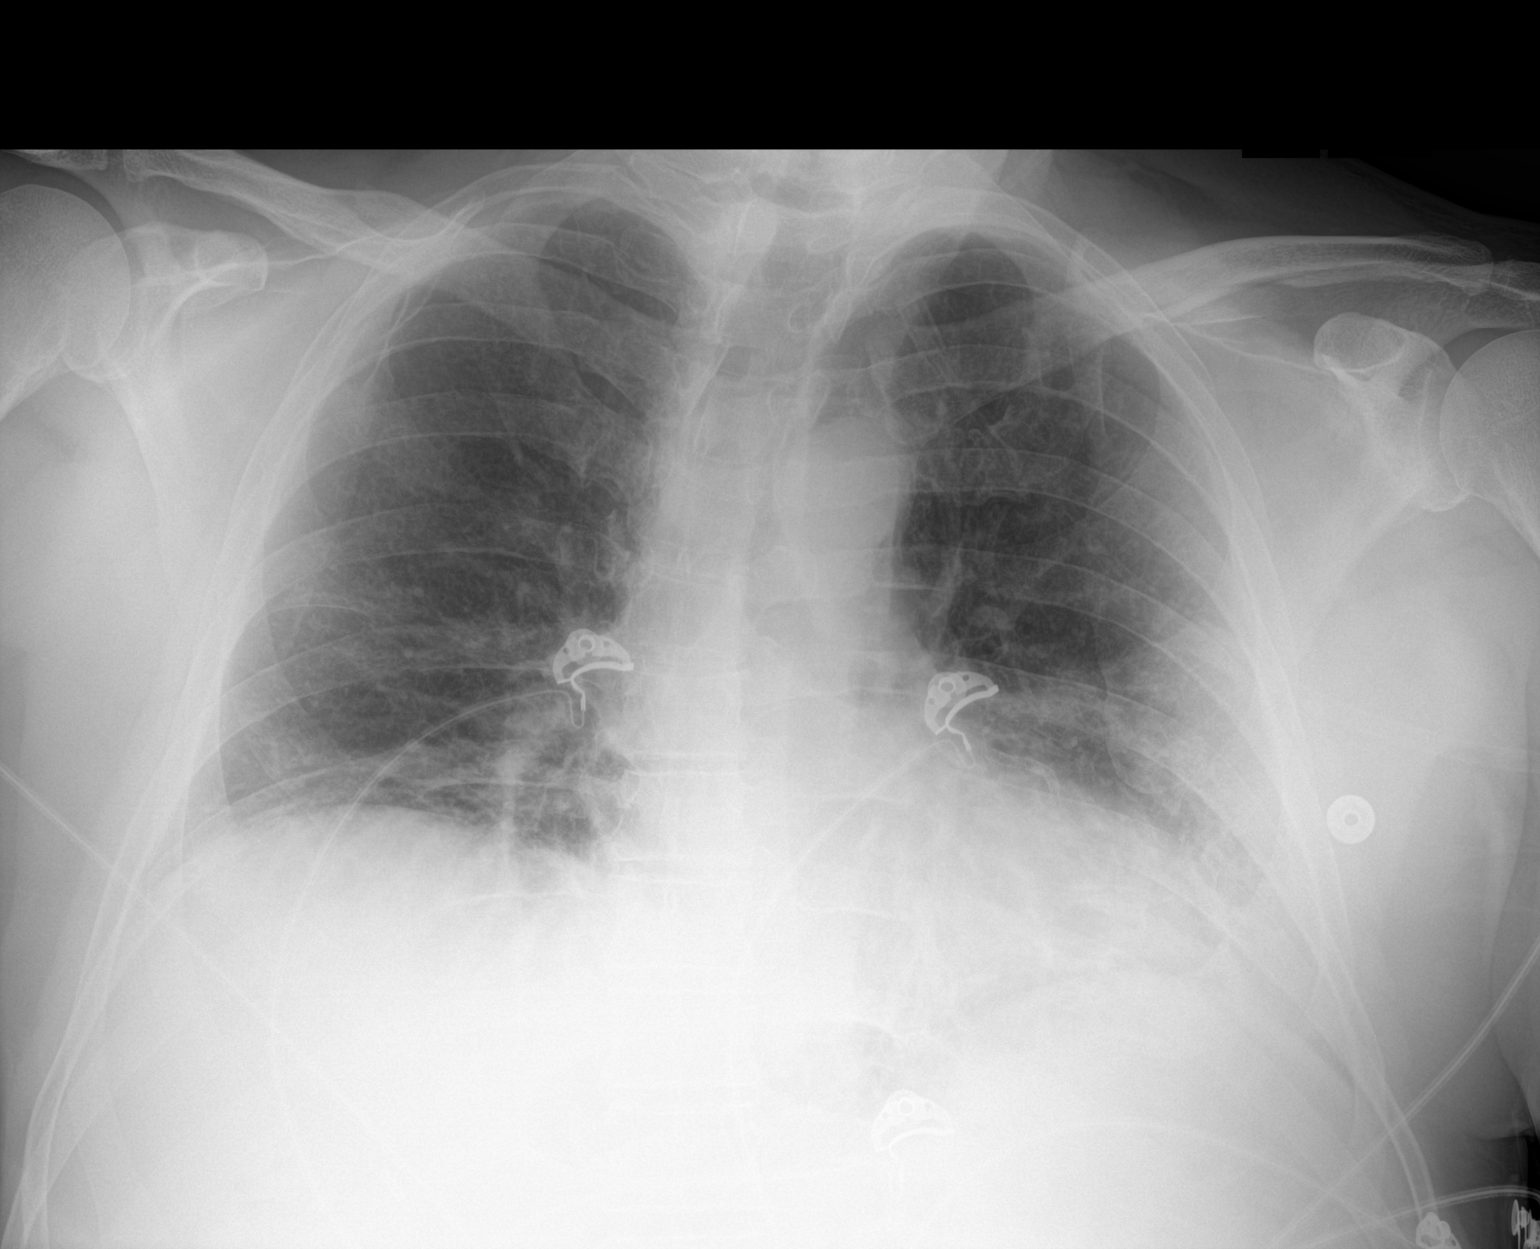

[1 of 1 positions shown; findings below may reference images not displayed]

FINDINGS: There is ill-defined airspace opacity in the left mid lung and
bibasilar regions. A more subtle degree of airspace opacity is noted
in the right mid lung. Heart is upper normal in size with pulmonary
vascularity normal. No adenopathy. No bone lesions.
IMPRESSION: Areas of ill-defined opacity bilaterally, more on the left than on
the right, likely due to multifocal pneumonia. Suspect atypical
organism pneumonia; check of [id number] status advised.

Heart upper normal in size.  No adenopathy evident.

## 2020-12-25 IMAGING — DX DG CHEST 1V
1 series · 1 of 1 positions shown · non-contrast
Comparison: November 24, 2019

CLINICAL DATA: Shortness of breath. Hypertension. Fatigue and
cough.

EXAM:
CHEST  1 VIEW

[chest ap]
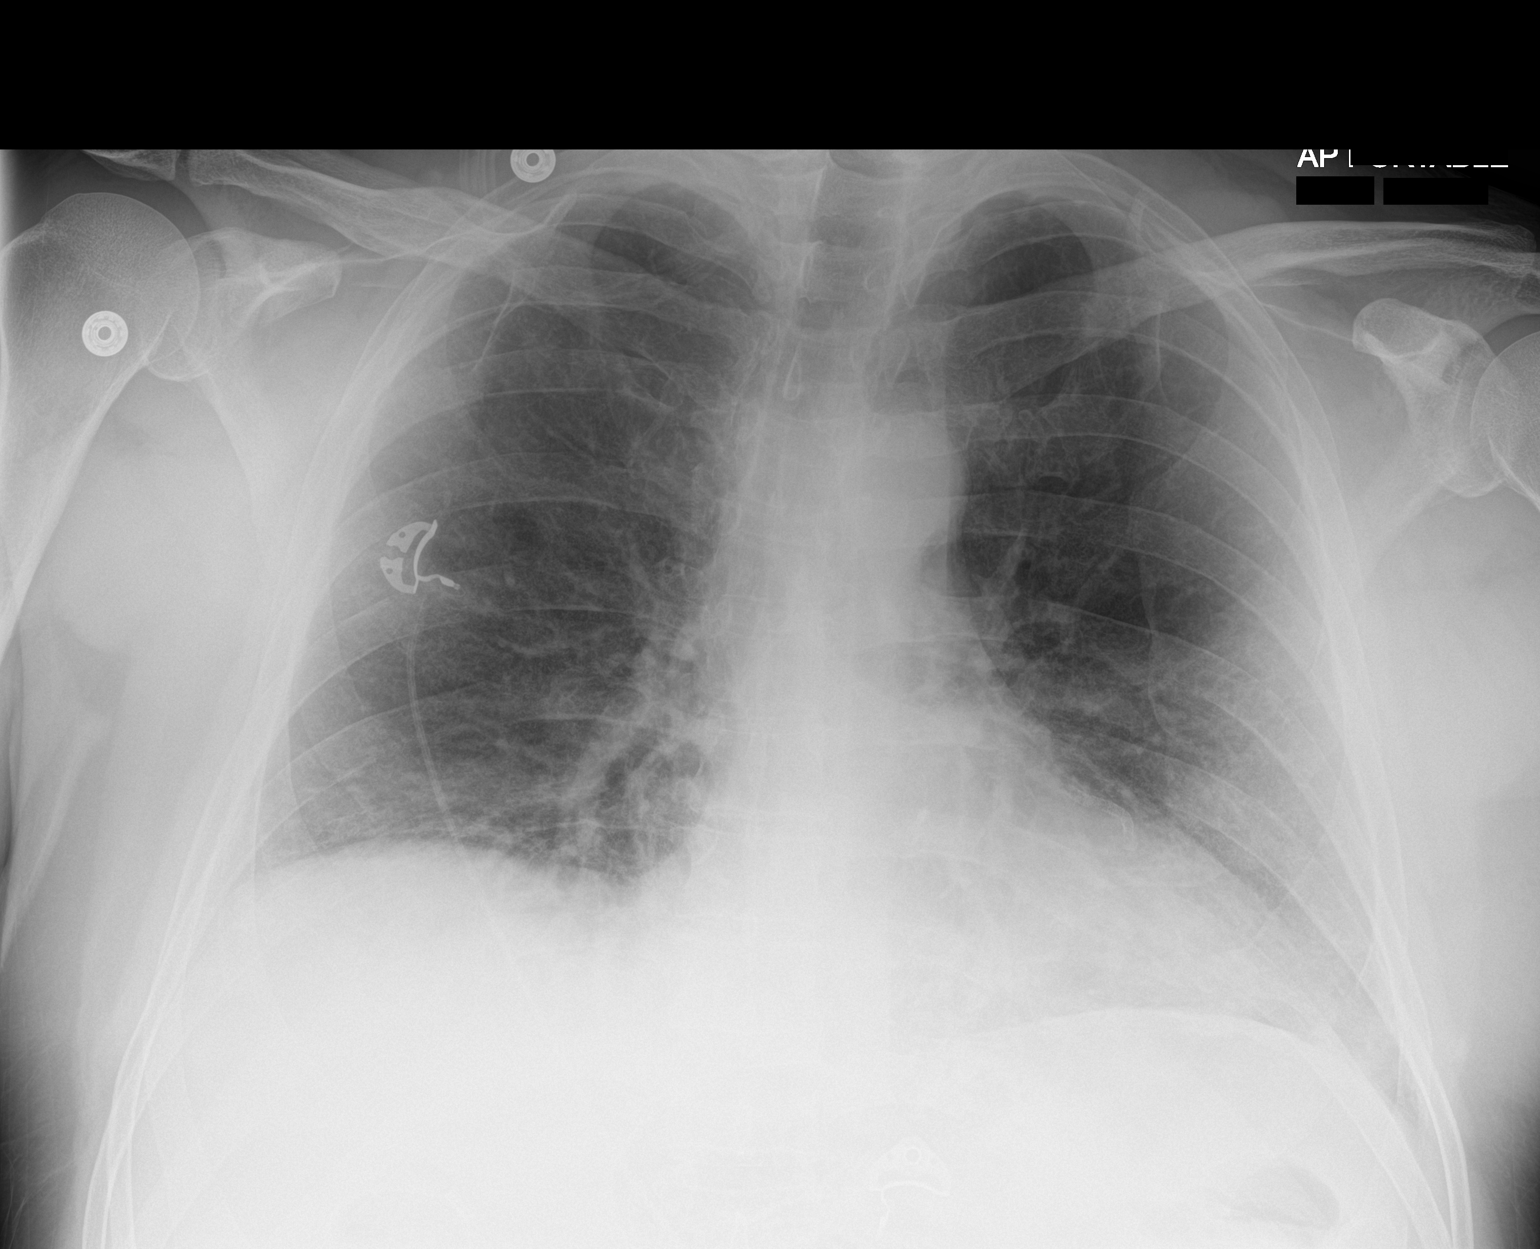

[1 of 1 positions shown; findings below may reference images not displayed]

FINDINGS: Ill-defined airspace opacity remains in the left mid lung. There is
ill-defined opacity in the right base with atelectasis. No
appreciable consolidation. Heart is upper normal in size with
pulmonary vascularity normal. No adenopathy. No bone lesions.
IMPRESSION: Persistent opacity in the left mid lung with an increase in opacity
in the right base. Question atypical organism pneumonia. Advise
check of [id number] status. Stable cardiac silhouette. No adenopathy.

## 2021-01-16 IMAGING — DX DG CHEST 1V PORT
2 series · 2 of 2 positions shown · non-contrast
Comparison: One-view chest x-ray 12/17/2019

CLINICAL DATA: ARDS due to COVID 19 virus.

EXAM:
PORTABLE CHEST 1 VIEW

[chest ap (1 of 2)]
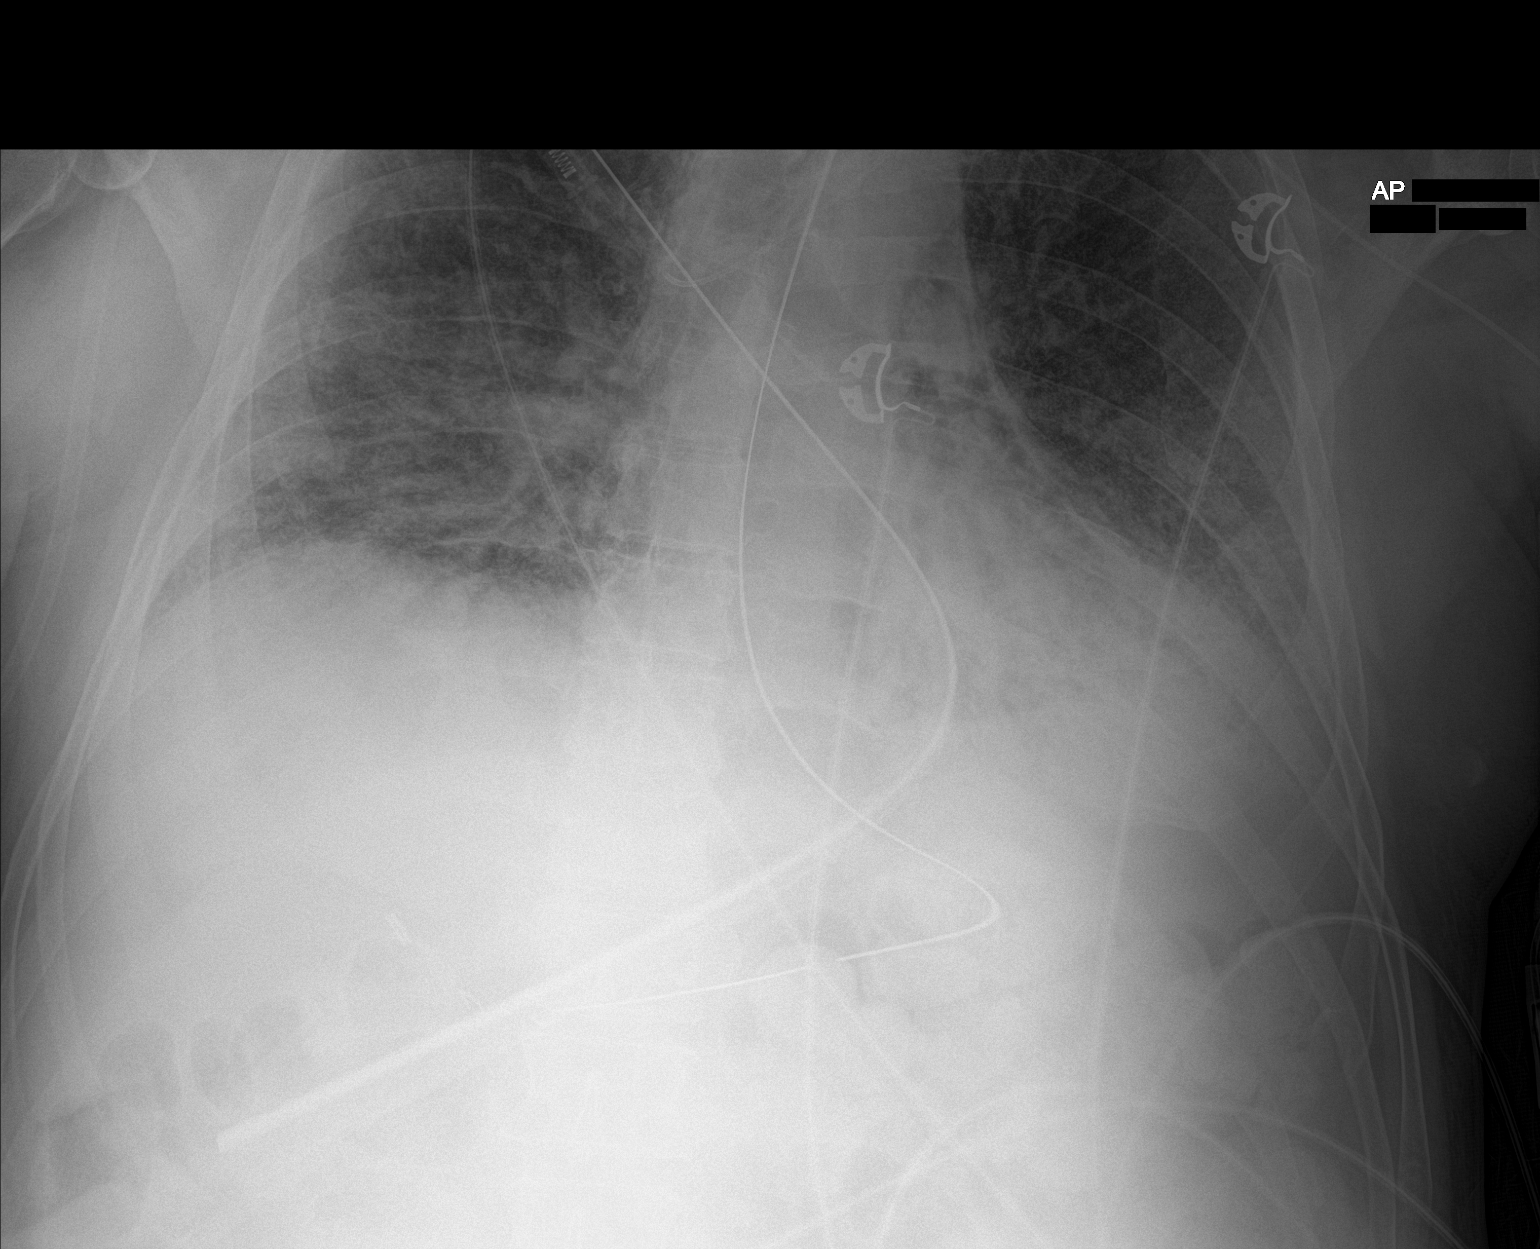

[chest ap (2 of 2)]
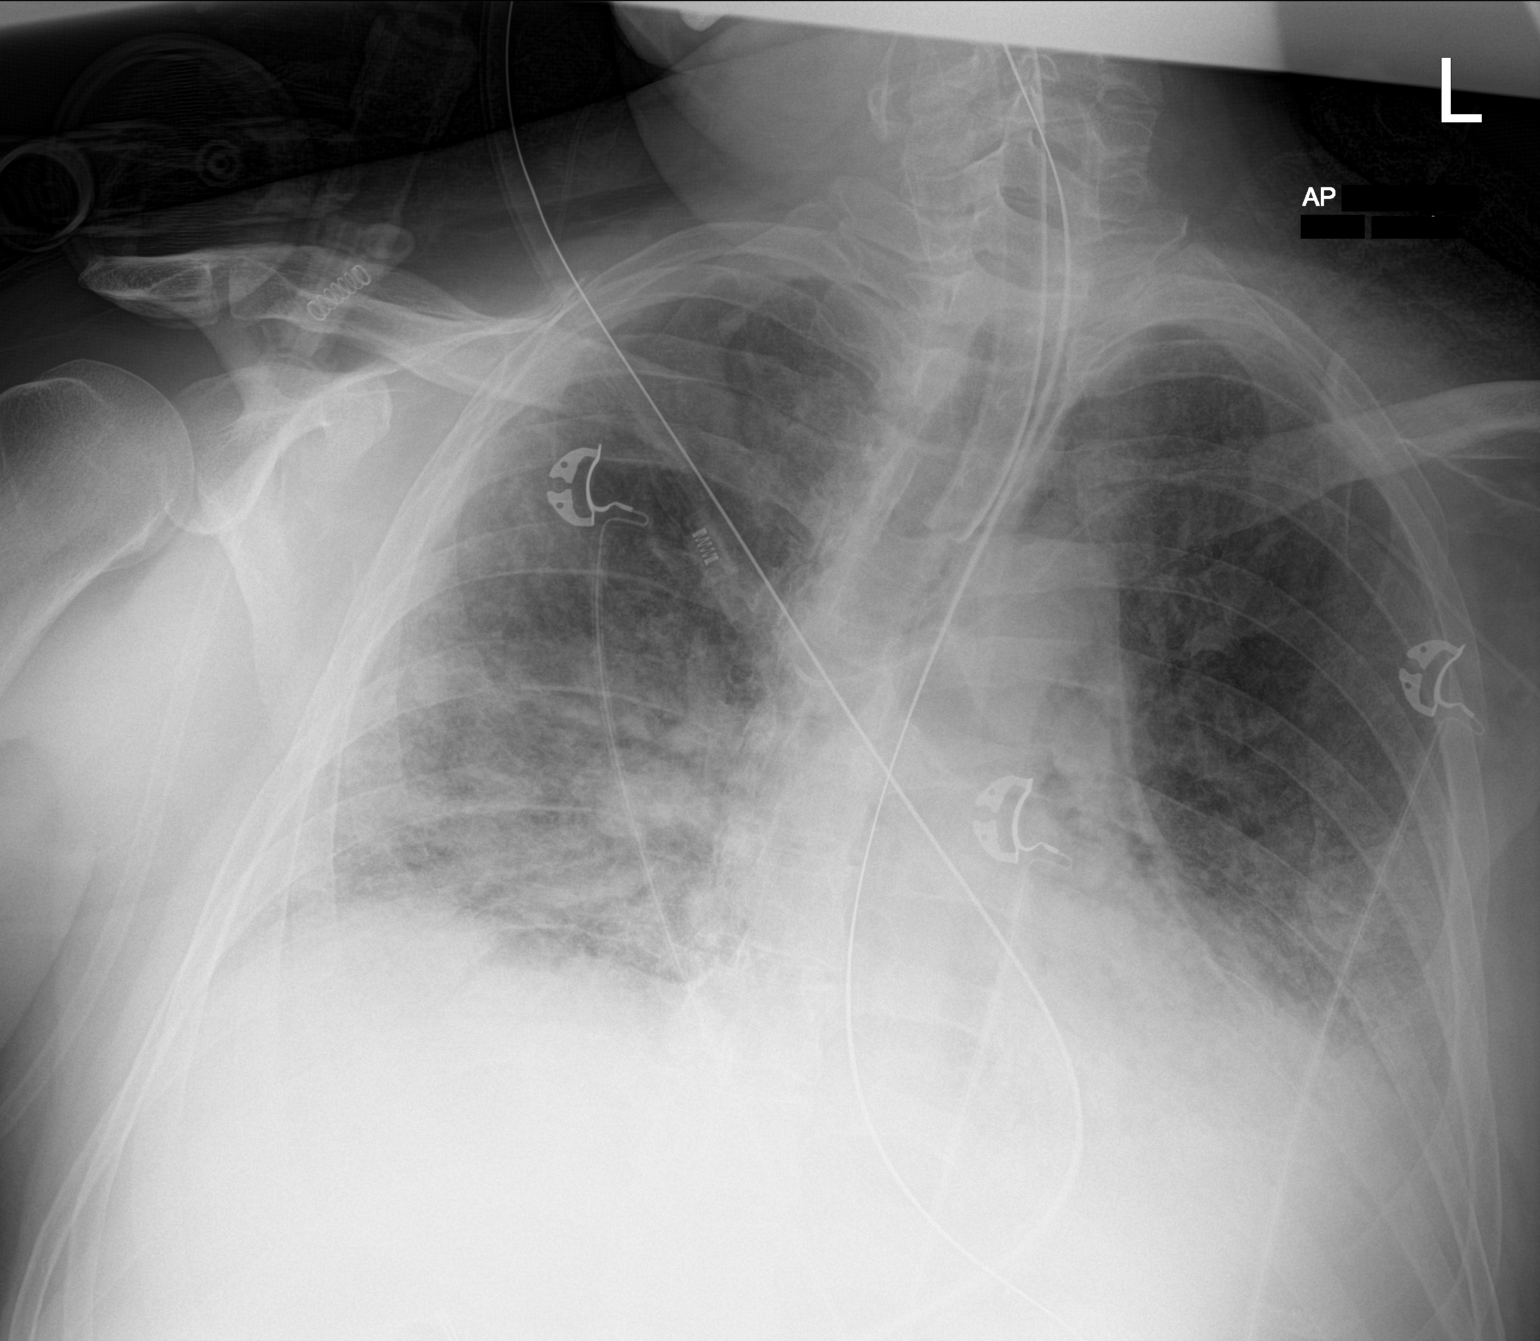

[2 of 2 positions shown; findings below may reference images not displayed]

FINDINGS: Heart size is normal. Endotracheal tube is stable. Enteric tube
courses off the inferior border of the film.

Interstitial and airspace opacities bilaterally are stable.
Bilateral effusions are present.
IMPRESSION: 1. Stable appearance of bilateral interstitial and airspace disease
compatible with multifocal pneumonia/ARDS.
2. Stable bilateral pleural effusions.

## 2021-01-17 IMAGING — DX DG CHEST 1V PORT
1 series · 1 of 1 positions shown · non-contrast
Comparison: 12/19/2019

CLINICAL DATA: ARDS due to [id number]

EXAM:
PORTABLE CHEST 1 VIEW

[chest ap]
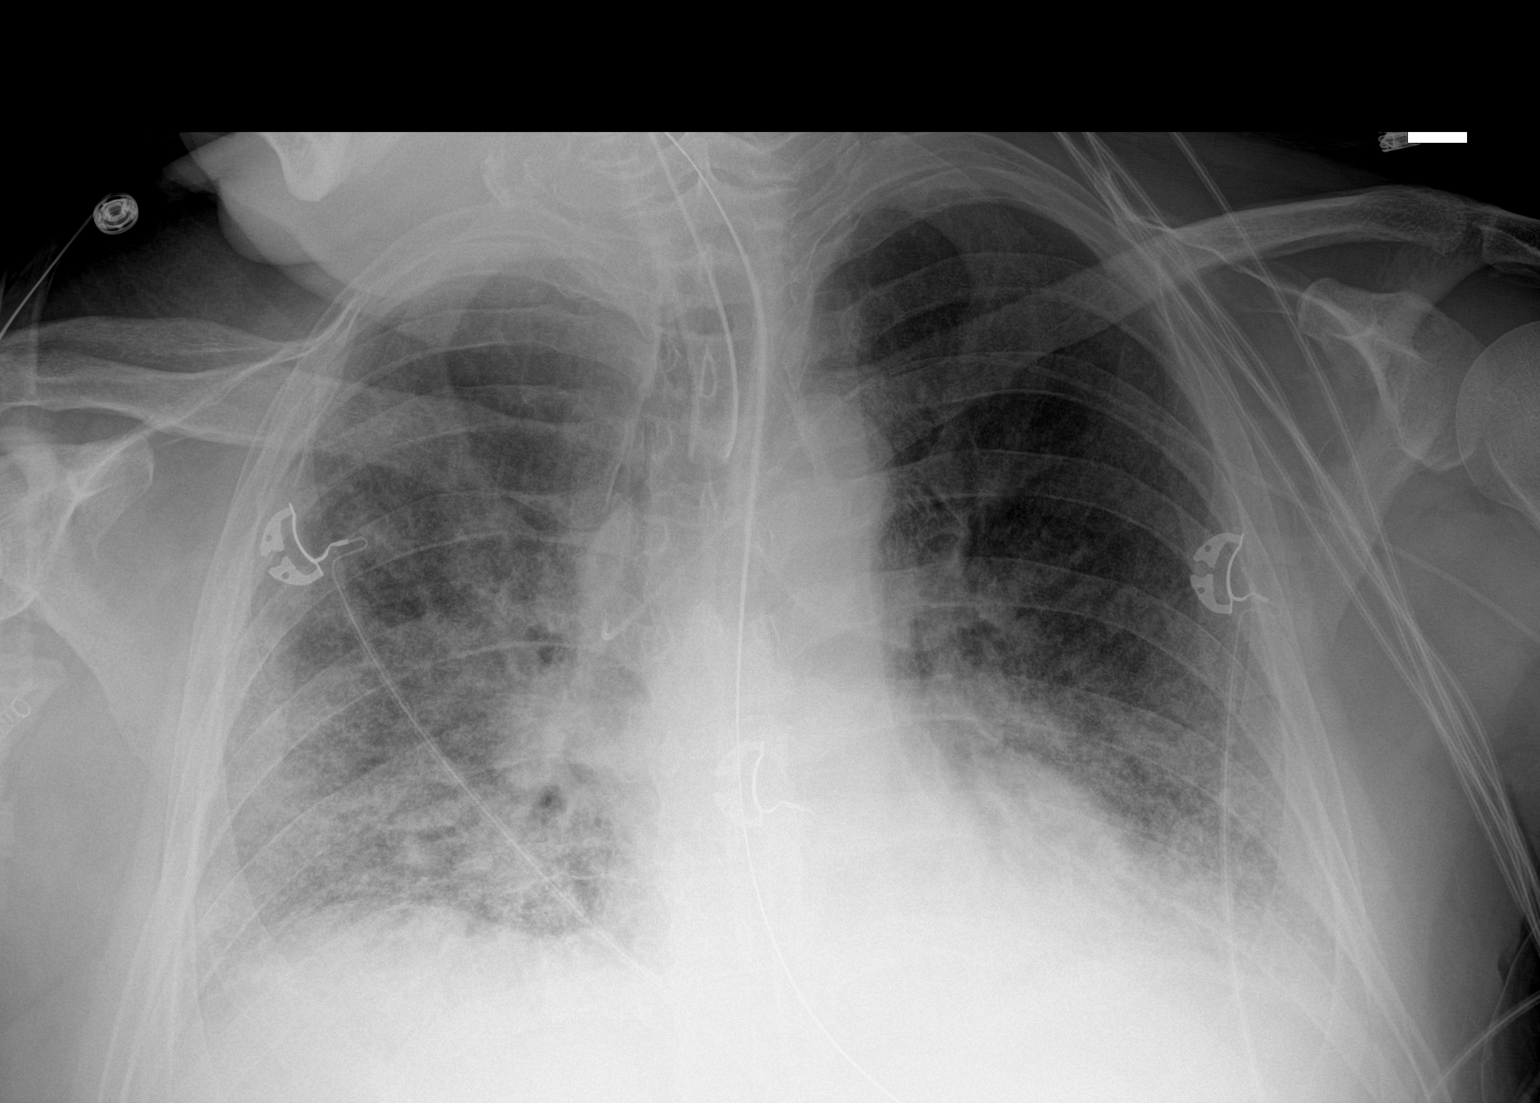

[1 of 1 positions shown; findings below may reference images not displayed]

FINDINGS: Multifocal pneumonia with relative sparing of the left lung apex,
unchanged. No pleural effusion or pneumothorax.

Endotracheal tube terminates 3.5 cm above the carina.

Left arm PICC likely terminates in the azygos vein.

Endotracheal tube courses into the stomach.
IMPRESSION: Multifocal pneumonia in this patient with known COVID, unchanged.

Endotracheal tube terminates 3.5 cm above the carina.

Left arm PICC likely terminates in the azygos vein.

## 2021-01-18 IMAGING — DX DG CHEST 1V PORT
1 series · 1 of 1 positions shown · non-contrast
Comparison: 12/20/2019

CLINICAL DATA: Acute respiratory failure

EXAM:
PORTABLE CHEST 1 VIEW

[chest ap]
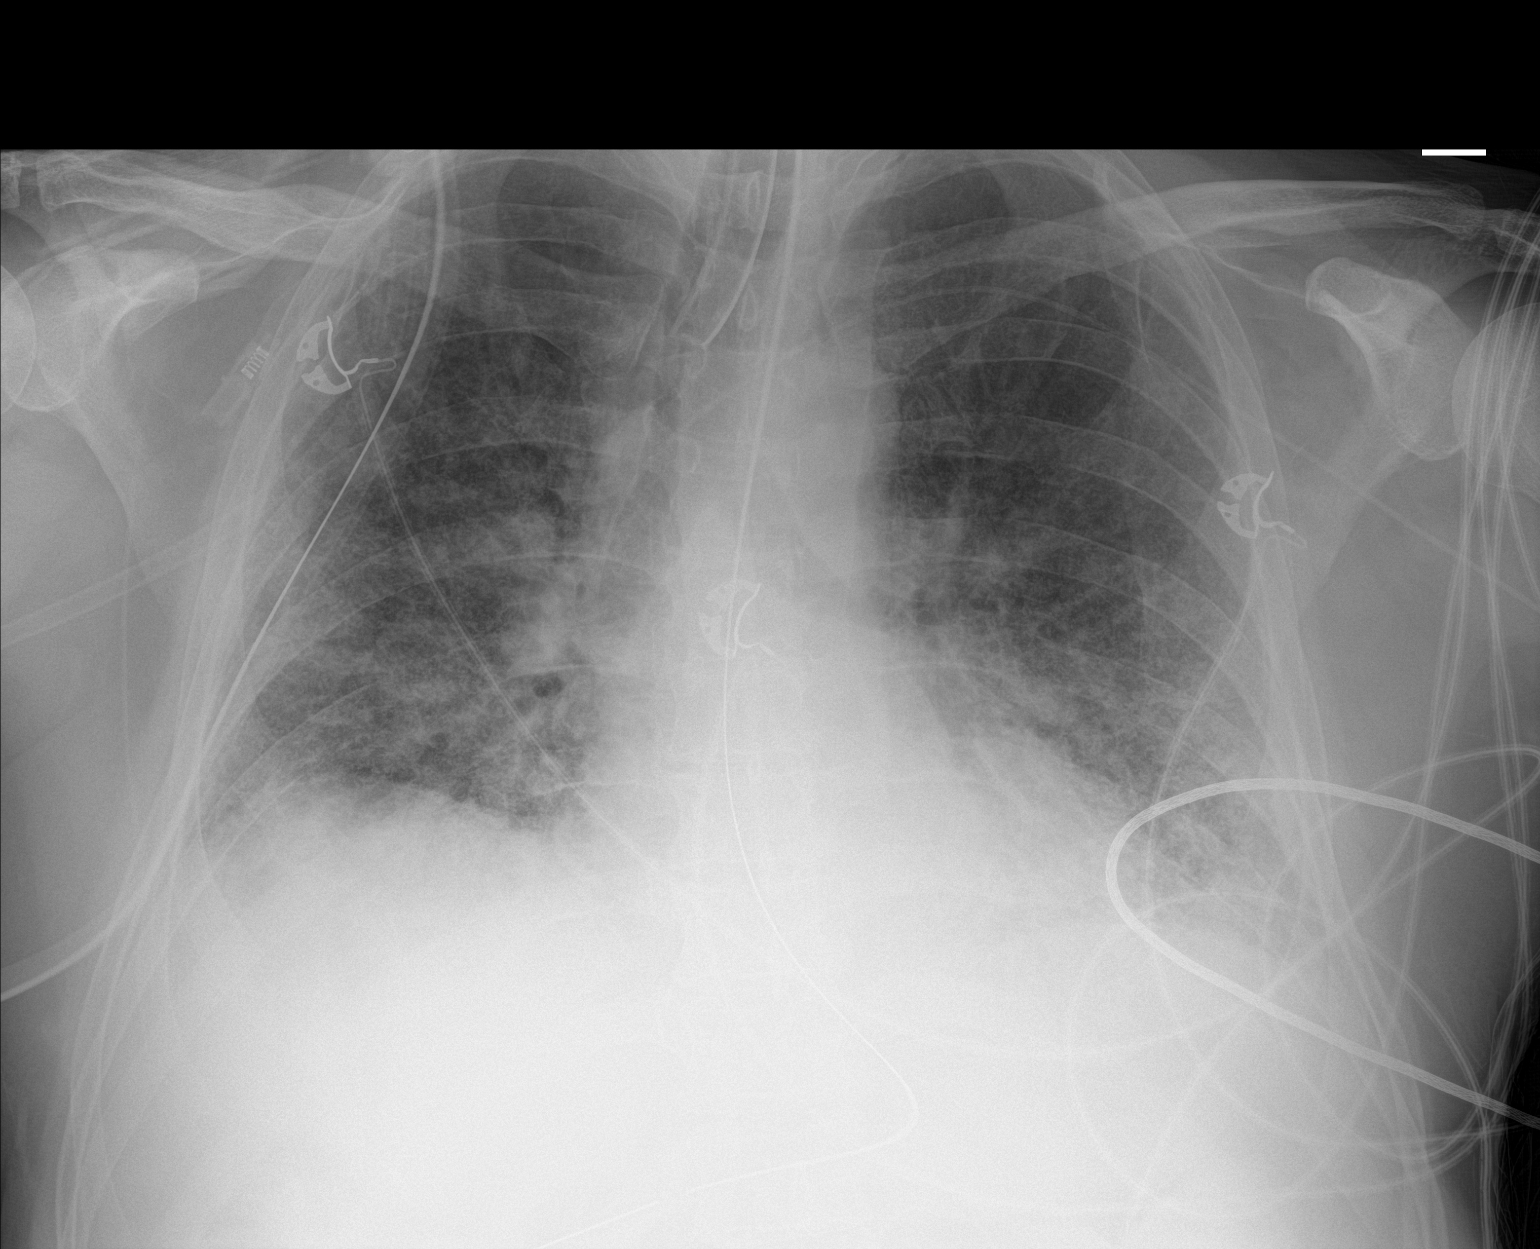

[1 of 1 positions shown; findings below may reference images not displayed]

FINDINGS: Support Apparatus:

--Endotracheal tube: Tip at the level of the clavicular heads.

--Enteric tube:Tip and sideport project over the stomach.

--Catheter(s):Left PICC line tip projects over the lower SVC.

--Other: None

Unchanged multifocal airspace opacities.
IMPRESSION: Endotracheal tube tip at the level of the clavicular heads.
Repositioned PICC line tip is likely in the lower SVC.
